# Patient Record
Sex: Male | Born: 1970 | Race: Black or African American | Hispanic: Yes | Marital: Single | Smoking: Former smoker
Health system: Southern US, Community
[De-identification: ages and names within clinical notes are randomized; demographics above are authoritative.]

## PROBLEM LIST (undated history)

## (undated) DIAGNOSIS — IMO0002 Reserved for concepts with insufficient information to code with codable children: Secondary | ICD-10-CM

## (undated) DIAGNOSIS — F17200 Nicotine dependence, unspecified, uncomplicated: Secondary | ICD-10-CM

## (undated) DIAGNOSIS — I10 Essential (primary) hypertension: Secondary | ICD-10-CM

## (undated) DIAGNOSIS — I2510 Atherosclerotic heart disease of native coronary artery without angina pectoris: Secondary | ICD-10-CM

## (undated) DIAGNOSIS — I2699 Other pulmonary embolism without acute cor pulmonale: Secondary | ICD-10-CM

## (undated) DIAGNOSIS — E1165 Type 2 diabetes mellitus with hyperglycemia: Secondary | ICD-10-CM

## (undated) DIAGNOSIS — I82409 Acute embolism and thrombosis of unspecified deep veins of unspecified lower extremity: Secondary | ICD-10-CM

## (undated) DIAGNOSIS — E785 Hyperlipidemia, unspecified: Secondary | ICD-10-CM

---

## 2019-11-30 DIAGNOSIS — I10 Essential (primary) hypertension: Secondary | ICD-10-CM | POA: Diagnosis not present

## 2019-11-30 DIAGNOSIS — E785 Hyperlipidemia, unspecified: Secondary | ICD-10-CM | POA: Diagnosis not present

## 2019-11-30 DIAGNOSIS — E119 Type 2 diabetes mellitus without complications: Secondary | ICD-10-CM | POA: Diagnosis not present

## 2019-11-30 DIAGNOSIS — Z7901 Long term (current) use of anticoagulants: Secondary | ICD-10-CM | POA: Diagnosis not present

## 2019-11-30 DIAGNOSIS — D688 Other specified coagulation defects: Secondary | ICD-10-CM | POA: Diagnosis not present

## 2020-01-07 ENCOUNTER — Other Ambulatory Visit: Payer: Self-pay

## 2020-01-07 ENCOUNTER — Emergency Department (HOSPITAL_COMMUNITY)
Admission: EM | Admit: 2020-01-07 | Discharge: 2020-01-07 | Disposition: A | Discharge status: Home / Self Care | Payer: BC Managed Care – PPO | Attending: Emergency Medicine | Admitting: Emergency Medicine

## 2020-01-07 ENCOUNTER — Encounter (HOSPITAL_COMMUNITY): Payer: Self-pay

## 2020-01-07 DIAGNOSIS — R202 Paresthesia of skin: Secondary | ICD-10-CM | POA: Diagnosis not present

## 2020-01-07 NOTE — ED Triage Notes (Signed)
Pt sts tingling in left inner forearm beginning at 1100 today. NIH-0

## 2020-01-07 NOTE — Discharge Instructions (Signed)
You were seen in the ER for tingling in left arm and hand  I think this is related to an inflammation of nerve in elbow/hand  Recommend wearing brace in wrist to keep it in neutral position at least at night time  Follow up with primary care doctor in 1 week if symptoms are not improving  Return for associated chest pain, shortness of breath, neck pain, weakness or paralysis of one side of face or body

## 2020-01-07 NOTE — ED Provider Notes (Signed)
[name]-EMERGENCY DEPT Provider Note   CSN: [id number] Arrival date & time: 01/07/20  1958     History Chief Complaint  Patient presents with  . Tingling    Greg Quinn is a 49 y.o. male with history of DM on metformin, HTN, DVT on warfarin presents to ER for evaluation of tingling in left arm from elbow to left 4th and 5th fingers that began this morning when he woke up.  This has been constant. States in the past he has occasionally he has noticed intermittent tingling in same distribution but has never lasted all day. 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 Denies any pain associated with this. Reports several years ago he had a significant injury where a wall fell on top of him. States he had a few "discs" in his neck and low back injured.  Denies associated neck pain, chest pain, shortness of breath. Denies weakness or numbness.   HPI     History reviewed. No pertinent past medical history.  There are no problems to display for this patient.   History reviewed. No pertinent surgical history.     No family history on file.  Social History   Tobacco Use  . Smoking status: Never Smoker  . Smokeless tobacco: Never Used  Substance Use Topics  . Alcohol use: Not Currently  . Drug use: Never    Home Medications Prior to Admission medications   Not on File    Allergies    Heparin  Review of Systems   Review of Systems  Neurological:       Tingling     Physical Exam Updated Vital Signs BP (!) 128/92 (BP Location: Right Arm)   Pulse 95   Temp 98.2 F (36.8 C) (Oral)   Resp 16   Ht 5\' 10"  (1.778 m)   Wt 122.5 kg   SpO2 96%   BMI 38.74 kg/m   Physical Exam Constitutional:      Appearance: He is well-developed.  HENT:     Head: Normocephalic.     Nose: Nose normal.  Eyes:     General: Lids are normal.  Neck:     Comments: No midline or paraspinal cervical tenderness  Cardiovascular:      Rate and Rhythm: Normal rate.     Comments: 1+ radial pulse LUE  Pulmonary:     Effort: Pulmonary effort is normal. No respiratory distress.  Musculoskeletal:        General: Normal range of motion.     Cervical back: Normal range of motion.     Comments: No reproducible CT spine, left shoulder, elbow or wrist tenderness. Full ROM of neck, shoulder, elbow, wrist without pain. Strength intact in left fingers, wrist and elbow against resistance   Neurological:     Mental Status: He is alert.     Comments: Sensation to light touch intact in radial, medial, ulnar nerve distribution. Strength 5/5 in LUE   Psychiatric:        Behavior: Behavior normal.     ED Results / Procedures / Treatments   Labs (all labs ordered are listed, but only abnormal results are displayed) Labs Reviewed - No data to display  EKG None  Radiology No results found.  Procedures Procedures (including critical care time)  Medications Ordered in ED Medications - No data to display  ED Course  I have reviewed the triage vital signs and the nursing notes.  Pertinent  labs & imaging results that were available during my care of the patient were reviewed by me and considered in my medical decision making (see chart for details).    MDM Rules/Calculators/A&P                          49 yo M presents with tingling in ulnar nerve distribution. No weakness or numbness on exam. Neurovascularly intact. No associated reproducible pain in neck or LUE. No indication for emergent lab work or imaging today. No associated CP, SOB.  He is a Location manager and frequently uses his hands/wrists.  Offered wrist brace but declined states he has one at home. Recommending rest, brace, PCP follow up if symptoms not improving. Return precautions given.  Final Clinical Impression(s) / ED Diagnoses Final diagnoses:  Tingling of upper extremity    Rx / DC Orders ED Discharge Orders    None       Liberty Handy,  PA-C 01/07/20 2253    Pricilla Loveless, MD 01/08/20 0030

## 2020-01-17 DIAGNOSIS — I10 Essential (primary) hypertension: Secondary | ICD-10-CM | POA: Diagnosis not present

## 2020-01-17 DIAGNOSIS — Z7984 Long term (current) use of oral hypoglycemic drugs: Secondary | ICD-10-CM | POA: Diagnosis not present

## 2020-01-17 DIAGNOSIS — Z760 Encounter for issue of repeat prescription: Secondary | ICD-10-CM | POA: Diagnosis not present

## 2020-01-17 DIAGNOSIS — E1165 Type 2 diabetes mellitus with hyperglycemia: Secondary | ICD-10-CM | POA: Diagnosis not present

## 2020-01-17 DIAGNOSIS — Z888 Allergy status to other drugs, medicaments and biological substances status: Secondary | ICD-10-CM | POA: Diagnosis not present

## 2020-01-17 DIAGNOSIS — Z20822 Contact with and (suspected) exposure to covid-19: Secondary | ICD-10-CM | POA: Diagnosis not present

## 2020-01-17 DIAGNOSIS — Z7901 Long term (current) use of anticoagulants: Secondary | ICD-10-CM | POA: Diagnosis not present

## 2020-01-17 DIAGNOSIS — R739 Hyperglycemia, unspecified: Secondary | ICD-10-CM | POA: Diagnosis not present

## 2020-01-29 DIAGNOSIS — Z03818 Encounter for observation for suspected exposure to other biological agents ruled out: Secondary | ICD-10-CM | POA: Diagnosis not present

## 2020-02-07 DIAGNOSIS — E119 Type 2 diabetes mellitus without complications: Secondary | ICD-10-CM | POA: Diagnosis not present

## 2020-02-07 DIAGNOSIS — I10 Essential (primary) hypertension: Secondary | ICD-10-CM | POA: Diagnosis not present

## 2020-02-07 DIAGNOSIS — Z86718 Personal history of other venous thrombosis and embolism: Secondary | ICD-10-CM | POA: Diagnosis not present

## 2020-08-01 DIAGNOSIS — I10 Essential (primary) hypertension: Secondary | ICD-10-CM | POA: Diagnosis not present

## 2020-08-01 DIAGNOSIS — E119 Type 2 diabetes mellitus without complications: Secondary | ICD-10-CM | POA: Diagnosis not present

## 2020-08-01 DIAGNOSIS — Z86718 Personal history of other venous thrombosis and embolism: Secondary | ICD-10-CM | POA: Diagnosis not present

## 2020-08-01 DIAGNOSIS — E785 Hyperlipidemia, unspecified: Secondary | ICD-10-CM | POA: Diagnosis not present

## 2020-08-20 ENCOUNTER — Emergency Department (HOSPITAL_COMMUNITY): Payer: BC Managed Care – PPO

## 2020-08-20 ENCOUNTER — Encounter (HOSPITAL_COMMUNITY): Payer: Self-pay

## 2020-08-20 ENCOUNTER — Encounter: Payer: Self-pay | Admitting: Physician Assistant

## 2020-08-20 ENCOUNTER — Inpatient Hospital Stay (HOSPITAL_COMMUNITY)
Admission: EM | Disposition: A | Discharge status: Home / Self Care | Payer: Self-pay | Source: Home / Self Care | Attending: Cardiovascular Disease

## 2020-08-20 ENCOUNTER — Other Ambulatory Visit: Payer: Self-pay

## 2020-08-20 ENCOUNTER — Inpatient Hospital Stay (HOSPITAL_COMMUNITY)
Admission: EM | Admit: 2020-08-20 | Discharge: 2020-08-21 | DRG: 247 | Disposition: A | Discharge status: Home / Self Care | Payer: BC Managed Care – PPO | Attending: Cardiovascular Disease | Admitting: Cardiovascular Disease

## 2020-08-20 ENCOUNTER — Inpatient Hospital Stay (HOSPITAL_COMMUNITY): Payer: BC Managed Care – PPO

## 2020-08-20 DIAGNOSIS — E118 Type 2 diabetes mellitus with unspecified complications: Secondary | ICD-10-CM | POA: Diagnosis present

## 2020-08-20 DIAGNOSIS — Z888 Allergy status to other drugs, medicaments and biological substances status: Secondary | ICD-10-CM

## 2020-08-20 DIAGNOSIS — I2699 Other pulmonary embolism without acute cor pulmonale: Secondary | ICD-10-CM | POA: Diagnosis present

## 2020-08-20 DIAGNOSIS — I82409 Acute embolism and thrombosis of unspecified deep veins of unspecified lower extremity: Secondary | ICD-10-CM | POA: Diagnosis present

## 2020-08-20 DIAGNOSIS — R079 Chest pain, unspecified: Secondary | ICD-10-CM | POA: Diagnosis not present

## 2020-08-20 DIAGNOSIS — Z86711 Personal history of pulmonary embolism: Secondary | ICD-10-CM | POA: Diagnosis not present

## 2020-08-20 DIAGNOSIS — F17210 Nicotine dependence, cigarettes, uncomplicated: Secondary | ICD-10-CM | POA: Diagnosis not present

## 2020-08-20 DIAGNOSIS — Z86718 Personal history of other venous thrombosis and embolism: Secondary | ICD-10-CM

## 2020-08-20 DIAGNOSIS — E1165 Type 2 diabetes mellitus with hyperglycemia: Secondary | ICD-10-CM | POA: Diagnosis not present

## 2020-08-20 DIAGNOSIS — E785 Hyperlipidemia, unspecified: Secondary | ICD-10-CM | POA: Diagnosis present

## 2020-08-20 DIAGNOSIS — I10 Essential (primary) hypertension: Secondary | ICD-10-CM | POA: Diagnosis present

## 2020-08-20 DIAGNOSIS — IMO0002 Reserved for concepts with insufficient information to code with codable children: Secondary | ICD-10-CM | POA: Insufficient documentation

## 2020-08-20 DIAGNOSIS — I200 Unstable angina: Secondary | ICD-10-CM | POA: Diagnosis present

## 2020-08-20 DIAGNOSIS — D7582 Heparin induced thrombocytopenia (HIT): Secondary | ICD-10-CM

## 2020-08-20 DIAGNOSIS — R059 Cough, unspecified: Secondary | ICD-10-CM | POA: Diagnosis not present

## 2020-08-20 DIAGNOSIS — I25110 Atherosclerotic heart disease of native coronary artery with unstable angina pectoris: Secondary | ICD-10-CM | POA: Diagnosis not present

## 2020-08-20 DIAGNOSIS — F17200 Nicotine dependence, unspecified, uncomplicated: Secondary | ICD-10-CM | POA: Diagnosis present

## 2020-08-20 DIAGNOSIS — Z95828 Presence of other vascular implants and grafts: Secondary | ICD-10-CM

## 2020-08-20 DIAGNOSIS — I2510 Atherosclerotic heart disease of native coronary artery without angina pectoris: Secondary | ICD-10-CM

## 2020-08-20 DIAGNOSIS — I214 Non-ST elevation (NSTEMI) myocardial infarction: Principal | ICD-10-CM | POA: Diagnosis present

## 2020-08-20 DIAGNOSIS — Z833 Family history of diabetes mellitus: Secondary | ICD-10-CM | POA: Diagnosis not present

## 2020-08-20 DIAGNOSIS — Z20822 Contact with and (suspected) exposure to covid-19: Secondary | ICD-10-CM | POA: Diagnosis not present

## 2020-08-20 DIAGNOSIS — Z955 Presence of coronary angioplasty implant and graft: Secondary | ICD-10-CM

## 2020-08-20 DIAGNOSIS — K219 Gastro-esophageal reflux disease without esophagitis: Secondary | ICD-10-CM | POA: Diagnosis present

## 2020-08-20 HISTORY — DX: Reserved for concepts with insufficient information to code with codable children: IMO0002

## 2020-08-20 HISTORY — DX: Nicotine dependence, unspecified, uncomplicated: F17.200

## 2020-08-20 HISTORY — DX: Other pulmonary embolism without acute cor pulmonale: I26.99

## 2020-08-20 HISTORY — DX: Hyperlipidemia, unspecified: E78.5

## 2020-08-20 HISTORY — PX: LEFT HEART CATH AND CORONARY ANGIOGRAPHY: CATH118249

## 2020-08-20 HISTORY — DX: Essential (primary) hypertension: I10

## 2020-08-20 HISTORY — PX: CORONARY STENT INTERVENTION: CATH118234

## 2020-08-20 HISTORY — DX: Non-ST elevation (NSTEMI) myocardial infarction: I21.4

## 2020-08-20 HISTORY — DX: Type 2 diabetes mellitus with hyperglycemia: E11.65

## 2020-08-20 HISTORY — DX: Acute embolism and thrombosis of unspecified deep veins of unspecified lower extremity: I82.409

## 2020-08-20 HISTORY — PX: INTRAVASCULAR ULTRASOUND/IVUS: CATH118244

## 2020-08-20 LAB — COMPREHENSIVE METABOLIC PANEL
ALT: 30 U/L (ref 0–44)
AST: 31 U/L (ref 15–41)
Albumin: 4.1 g/dL (ref 3.5–5.0)
Alkaline Phosphatase: 96 U/L (ref 38–126)
Anion gap: 12 (ref 5–15)
BUN: 13 mg/dL (ref 6–20)
CO2: 25 mmol/L (ref 22–32)
Calcium: 9.4 mg/dL (ref 8.9–10.3)
Chloride: 100 mmol/L (ref 98–111)
Creatinine, Ser: 0.85 mg/dL (ref 0.61–1.24)
GFR, Estimated: >60 mL/min (ref >60)
Glucose, Bld: 253 mg/dL — ABNORMAL HIGH (ref 70–99)
Potassium: 3.7 mmol/L (ref 3.5–5.1)
Sodium: 137 mmol/L (ref 135–145)
Total Bilirubin: 0.5 mg/dL (ref 0.3–1.2)
Total Protein: 7.3 g/dL (ref 6.5–8.1)

## 2020-08-20 LAB — ECHOCARDIOGRAM COMPLETE
AR max vel: 1.93 cm2
AV Area VTI: 2.11 cm2
AV Area mean vel: 1.61 cm2
AV Mean grad: 6 mmHg
AV Peak grad: 10 mmHg
Ao pk vel: 1.58 m/s
Area-P 1/2: 3.65 cm2
Height: 70.5 in
MV VTI: 1.99 cm2
S' Lateral: 2.2 cm
Weight: 4192 oz

## 2020-08-20 LAB — CBC WITH DIFFERENTIAL/PLATELET
Abs Immature Granulocytes: 0.04 10*3/uL (ref 0.00–0.07)
Basophils Absolute: 0 10*3/uL (ref 0.0–0.1)
Basophils Relative: 0 %
Eosinophils Absolute: 0.1 10*3/uL (ref 0.0–0.5)
Eosinophils Relative: 1 %
HCT: 40.6 % (ref 39.0–52.0)
Hemoglobin: 13.5 g/dL (ref 13.0–17.0)
Immature Granulocytes: 0 %
Lymphocytes Relative: 11 %
Lymphs Abs: 1.2 10*3/uL (ref 0.7–4.0)
MCH: 30.3 pg (ref 26.0–34.0)
MCHC: 33.3 g/dL (ref 30.0–36.0)
MCV: 91 fL (ref 80.0–100.0)
Monocytes Absolute: 0.6 10*3/uL (ref 0.1–1.0)
Monocytes Relative: 6 %
Neutro Abs: 9 10*3/uL — ABNORMAL HIGH (ref 1.7–7.7)
Neutrophils Relative %: 82 %
Platelets: 240 10*3/uL (ref 150–400)
RBC: 4.46 MIL/uL (ref 4.22–5.81)
RDW: 13.3 % (ref 11.5–15.5)
WBC: 10.9 10*3/uL — ABNORMAL HIGH (ref 4.0–10.5)
nRBC: 0 % (ref 0.0–0.2)

## 2020-08-20 LAB — APTT
aPTT: 28 seconds (ref 24–36)
aPTT: 92 seconds — ABNORMAL HIGH (ref 24–36)

## 2020-08-20 LAB — TROPONIN I (HIGH SENSITIVITY)
Troponin I (High Sensitivity): 432 ng/L (ref <18)
Troponin I (High Sensitivity): 1427 ng/L (ref <18)
Troponin I (High Sensitivity): 10331 ng/L (ref <18)
Troponin I (High Sensitivity): 8707 ng/L (ref <18)

## 2020-08-20 LAB — GLUCOSE, CAPILLARY
Glucose-Capillary: 103 mg/dL — ABNORMAL HIGH (ref 70–99)
Glucose-Capillary: 138 mg/dL — ABNORMAL HIGH (ref 70–99)
Glucose-Capillary: 212 mg/dL — ABNORMAL HIGH (ref 70–99)
Glucose-Capillary: 208 mg/dL — ABNORMAL HIGH (ref 70–99)

## 2020-08-20 LAB — HIV ANTIBODY (ROUTINE TESTING W REFLEX): HIV Screen 4th Generation wRfx: NONREACTIVE

## 2020-08-20 LAB — LIPASE, BLOOD: Lipase: 32 U/L (ref 11–51)

## 2020-08-20 LAB — POCT ACTIVATED CLOTTING TIME
Activated Clotting Time: 242 seconds
Activated Clotting Time: 347 seconds

## 2020-08-20 LAB — RESP PANEL BY RT-PCR (FLU A&B, COVID) ARPGX2
Influenza A by PCR: NEGATIVE
Influenza B by PCR: NEGATIVE
SARS Coronavirus 2 by RT PCR: NEGATIVE

## 2020-08-20 LAB — PROTIME-INR
INR: 1 (ref 0.8–1.2)
Prothrombin Time: 12.7 seconds (ref 11.4–15.2)

## 2020-08-20 SURGERY — LEFT HEART CATH AND CORONARY ANGIOGRAPHY
Anesthesia: LOCAL

## 2020-08-20 MED ORDER — ACETAMINOPHEN 500 MG PO TABS
1000.0000 mg | ORAL_TABLET | Freq: Once | ORAL | Status: AC
  Administered 2020-08-20: 1000 mg via ORAL
  Filled 2020-08-20: qty 2

## 2020-08-20 MED ORDER — NITROGLYCERIN 1 MG/10 ML FOR IR/CATH LAB
INTRA_ARTERIAL | Status: AC
  Filled 2020-08-20: qty 10

## 2020-08-20 MED ORDER — SODIUM CHLORIDE 0.9 % IV SOLN
INTRAVENOUS | Status: AC | PRN
  Administered 2020-08-20: 1000 mL via INTRAVENOUS

## 2020-08-20 MED ORDER — NITROGLYCERIN IN D5W 200-5 MCG/ML-% IV SOLN
0.0000 ug/min | INTRAVENOUS | Status: DC
  Administered 2020-08-20: 5 ug/min via INTRAVENOUS
  Filled 2020-08-20: qty 250

## 2020-08-20 MED ORDER — ATORVASTATIN CALCIUM 80 MG PO TABS: 80.0000 mg | ORAL_TABLET | Freq: Every day | ORAL | Status: DC

## 2020-08-20 MED ORDER — SODIUM CHLORIDE 0.9 % IV SOLN: INTRAVENOUS | Status: DC | PRN

## 2020-08-20 MED ORDER — ATORVASTATIN CALCIUM 80 MG PO TABS
80.0000 mg | ORAL_TABLET | Freq: Every day | ORAL | Status: DC
  Administered 2020-08-20 – 2020-08-21 (×2): 80 mg via ORAL
  Filled 2020-08-20 (×2): qty 1

## 2020-08-20 MED ORDER — ACETAMINOPHEN 325 MG PO TABS: 650.0000 mg | ORAL_TABLET | ORAL | Status: DC | PRN

## 2020-08-20 MED ORDER — ONDANSETRON HCL 4 MG/2ML IJ SOLN: 4.0000 mg | Freq: Four times a day (QID) | INTRAMUSCULAR | Status: DC | PRN

## 2020-08-20 MED ORDER — SODIUM CHLORIDE 0.9 % WEIGHT BASED INFUSION: 3.0000 mL/kg/h | INTRAVENOUS | Status: DC

## 2020-08-20 MED ORDER — AMLODIPINE BESYLATE 5 MG PO TABS
5.0000 mg | ORAL_TABLET | Freq: Every day | ORAL | Status: DC
  Administered 2020-08-20 – 2020-08-21 (×2): 5 mg via ORAL
  Filled 2020-08-20 (×2): qty 1

## 2020-08-20 MED ORDER — LISINOPRIL 20 MG PO TABS
20.0000 mg | ORAL_TABLET | Freq: Every day | ORAL | Status: DC
  Administered 2020-08-20 – 2020-08-21 (×2): 20 mg via ORAL
  Filled 2020-08-20 (×2): qty 1

## 2020-08-20 MED ORDER — LIDOCAINE HCL (PF) 1 % IJ SOLN
INTRAMUSCULAR | Status: DC | PRN
  Administered 2020-08-20: 2 mL via SUBCUTANEOUS

## 2020-08-20 MED ORDER — VERAPAMIL HCL 2.5 MG/ML IV SOLN
INTRAVENOUS | Status: DC | PRN
  Administered 2020-08-20: 10 mL via INTRA_ARTERIAL

## 2020-08-20 MED ORDER — ASPIRIN EC 81 MG PO TBEC
81.0000 mg | DELAYED_RELEASE_TABLET | Freq: Every day | ORAL | Status: DC
  Administered 2020-08-21: 81 mg via ORAL
  Filled 2020-08-20: qty 1

## 2020-08-20 MED ORDER — LABETALOL HCL 5 MG/ML IV SOLN: 10.0000 mg | INTRAVENOUS | Status: AC | PRN

## 2020-08-20 MED ORDER — LIDOCAINE HCL (PF) 1 % IJ SOLN
INTRAMUSCULAR | Status: AC
  Filled 2020-08-20: qty 30

## 2020-08-20 MED ORDER — MIDAZOLAM HCL 2 MG/2ML IJ SOLN
INTRAMUSCULAR | Status: AC
  Filled 2020-08-20: qty 2

## 2020-08-20 MED ORDER — ALUM & MAG HYDROXIDE-SIMETH 200-200-20 MG/5ML PO SUSP
30.0000 mL | Freq: Once | ORAL | Status: AC
  Administered 2020-08-20: 30 mL via ORAL
  Filled 2020-08-20: qty 30

## 2020-08-20 MED ORDER — HEPARIN (PORCINE) IN NACL 1000-0.9 UT/500ML-% IV SOLN
INTRAVENOUS | Status: AC
  Filled 2020-08-20: qty 1000

## 2020-08-20 MED ORDER — INSULIN ASPART 100 UNIT/ML IJ SOLN
0.0000 [IU] | Freq: Three times a day (TID) | INTRAMUSCULAR | Status: DC
  Administered 2020-08-21 (×2): 3 [IU] via SUBCUTANEOUS

## 2020-08-20 MED ORDER — SODIUM CHLORIDE 0.9% FLUSH
3.0000 mL | Freq: Two times a day (BID) | INTRAVENOUS | Status: DC
  Administered 2020-08-20 (×2): 3 mL via INTRAVENOUS

## 2020-08-20 MED ORDER — MIDAZOLAM HCL 2 MG/2ML IJ SOLN
INTRAMUSCULAR | Status: DC | PRN
  Administered 2020-08-20: 2 mg via INTRAVENOUS

## 2020-08-20 MED ORDER — SODIUM CHLORIDE 0.9 % IV SOLN: 250.0000 mL | INTRAVENOUS | Status: DC | PRN

## 2020-08-20 MED ORDER — SODIUM CHLORIDE 0.9% FLUSH: 3.0000 mL | INTRAVENOUS | Status: DC | PRN

## 2020-08-20 MED ORDER — IOHEXOL 350 MG/ML SOLN
INTRAVENOUS | Status: DC | PRN
  Administered 2020-08-20: 115 mL

## 2020-08-20 MED ORDER — FENTANYL CITRATE (PF) 100 MCG/2ML IJ SOLN
INTRAMUSCULAR | Status: AC
  Filled 2020-08-20: qty 2

## 2020-08-20 MED ORDER — PNEUMOCOCCAL VAC POLYVALENT 25 MCG/0.5ML IJ INJ
0.5000 mL | INJECTION | INTRAMUSCULAR | Status: DC
  Filled 2020-08-20: qty 0.5

## 2020-08-20 MED ORDER — SODIUM CHLORIDE 0.9 % IV SOLN
1.7500 mg/kg/h | INTRAVENOUS | Status: DC
  Administered 2020-08-20 (×3): 1.75 mg/kg/h via INTRAVENOUS
  Filled 2020-08-20 (×7): qty 250

## 2020-08-20 MED ORDER — ASPIRIN 81 MG PO CHEW
324.0000 mg | CHEWABLE_TABLET | Freq: Once | ORAL | Status: AC
  Administered 2020-08-20: 324 mg via ORAL
  Filled 2020-08-20: qty 4

## 2020-08-20 MED ORDER — BIVALIRUDIN BOLUS VIA INFUSION
0.1000 mg/kg | Freq: Once | INTRAVENOUS | Status: AC
  Administered 2020-08-20 (×2): 11.9 mg via INTRAVENOUS
  Filled 2020-08-20: qty 12

## 2020-08-20 MED ORDER — NITROGLYCERIN 1 MG/10 ML FOR IR/CATH LAB
INTRA_ARTERIAL | Status: DC | PRN
  Administered 2020-08-20: 200 ug via INTRACORONARY

## 2020-08-20 MED ORDER — FENTANYL CITRATE (PF) 100 MCG/2ML IJ SOLN
INTRAMUSCULAR | Status: DC | PRN
  Administered 2020-08-20: 25 ug via INTRAVENOUS

## 2020-08-20 MED ORDER — TICAGRELOR 90 MG PO TABS
ORAL_TABLET | ORAL | Status: AC
  Filled 2020-08-20: qty 2

## 2020-08-20 MED ORDER — TICAGRELOR 90 MG PO TABS
ORAL_TABLET | ORAL | Status: DC | PRN
  Administered 2020-08-20: 180 mg via ORAL

## 2020-08-20 MED ORDER — VERAPAMIL HCL 2.5 MG/ML IV SOLN
INTRAVENOUS | Status: AC
  Filled 2020-08-20: qty 2

## 2020-08-20 MED ORDER — MORPHINE SULFATE (PF) 2 MG/ML IV SOLN
2.0000 mg | INTRAVENOUS | Status: DC
  Administered 2020-08-20 (×2): 2 mg via INTRAVENOUS
  Filled 2020-08-20 (×2): qty 1

## 2020-08-20 MED ORDER — CLOPIDOGREL BISULFATE 75 MG PO TABS
300.0000 mg | ORAL_TABLET | Freq: Once | ORAL | Status: AC
  Administered 2020-08-21: 300 mg via ORAL
  Filled 2020-08-20: qty 4

## 2020-08-20 MED ORDER — INSULIN ASPART 100 UNIT/ML IJ SOLN
0.0000 [IU] | Freq: Every day | INTRAMUSCULAR | Status: DC
  Administered 2020-08-20: 2 [IU] via SUBCUTANEOUS

## 2020-08-20 MED ORDER — NITROGLYCERIN 0.4 MG SL SUBL
0.4000 mg | SUBLINGUAL_TABLET | SUBLINGUAL | Status: AC | PRN
Start: 2020-08-20 — End: 2020-08-20
  Administered 2020-08-20 (×3): 0.4 mg via SUBLINGUAL
  Filled 2020-08-20: qty 1

## 2020-08-20 MED ORDER — ASPIRIN 81 MG PO CHEW: 81.0000 mg | CHEWABLE_TABLET | Freq: Every day | ORAL | Status: DC

## 2020-08-20 MED ORDER — HYDRALAZINE HCL 20 MG/ML IJ SOLN: 10.0000 mg | INTRAMUSCULAR | Status: AC | PRN

## 2020-08-20 MED ORDER — SODIUM CHLORIDE 0.9 % WEIGHT BASED INFUSION
1.0000 mL/kg/h | INTRAVENOUS | Status: DC
  Administered 2020-08-20: 1 mL/kg/h via INTRAVENOUS

## 2020-08-20 MED ORDER — CLOPIDOGREL BISULFATE 75 MG PO TABS: 75.0000 mg | ORAL_TABLET | Freq: Every day | ORAL | Status: DC

## 2020-08-20 MED ORDER — NITROGLYCERIN IN D5W 200-5 MCG/ML-% IV SOLN: 0.0000 ug/min | INTRAVENOUS | Status: DC

## 2020-08-20 MED ORDER — METOPROLOL TARTRATE 12.5 MG HALF TABLET
12.5000 mg | ORAL_TABLET | Freq: Two times a day (BID) | ORAL | Status: DC
  Administered 2020-08-20 (×2): 12.5 mg via ORAL
  Filled 2020-08-20 (×2): qty 1

## 2020-08-20 SURGICAL SUPPLY — 20 items
BALLN SAPPHIRE 3.0X20 (BALLOONS) ×2
BALLN ~~LOC~~ EMERGE MR 4.5X12 (BALLOONS) ×2
BALLOON SAPPHIRE 3.0X20 (BALLOONS) ×1 IMPLANT
BALLOON ~~LOC~~ EMERGE MR 4.5X12 (BALLOONS) ×1 IMPLANT
CATH 5FR JL3.5 JR4 ANG PIG MP (CATHETERS) ×2 IMPLANT
CATH LAUNCHER 6FR EBU3.5 (CATHETERS) ×2 IMPLANT
CATH OPTICROSS HD (CATHETERS) ×2 IMPLANT
DEVICE RAD COMP TR BAND LRG (VASCULAR PRODUCTS) ×2 IMPLANT
GLIDESHEATH SLEND SS 6F .021 (SHEATH) ×2 IMPLANT
GUIDEWIRE INQWIRE 1.5J.035X260 (WIRE) ×1 IMPLANT
INQWIRE 1.5J .035X260CM (WIRE) ×2
KIT ENCORE 26 ADVANTAGE (KITS) ×2 IMPLANT
KIT HEART LEFT (KITS) ×2 IMPLANT
KIT HEMO VALVE WATCHDOG (MISCELLANEOUS) ×2 IMPLANT
PACK CARDIAC CATHETERIZATION (CUSTOM PROCEDURE TRAY) ×2 IMPLANT
SLED PULL BACK IVUS (MISCELLANEOUS) ×2 IMPLANT
STENT ONYX FRONTIER 4.0X26 (Permanent Stent) ×2 IMPLANT
TRANSDUCER W/STOPCOCK (MISCELLANEOUS) ×2 IMPLANT
TUBING CIL FLEX 10 FLL-RA (TUBING) ×2 IMPLANT
WIRE ASAHI PROWATER 180CM (WIRE) ×2 IMPLANT

## 2020-08-20 NOTE — Progress Notes (Signed)
Patient with multiple cardiac RFs (HTN, DM2, active smoker, HLD) presented with chest pain of 48h duration. Initial ECG with upsloping ST most prominent in inferior leads, do not meet STEMI criteria and no prior for comparison. Repeat ECG with less pronounced ST sloping. hsT increased however CP very mild per ED physician. Plan for ED->ED transfer to expedite eval given limited inpatient bed availability currently. Has allergy listed to heparin, high severity but not listed type. Prior DVT on warfarin ? (INR 1.0). S/p ASA 324 mg PO (0205), bival gtt (0431), low dose NG gtt.

## 2020-08-20 NOTE — Progress Notes (Signed)
ANTICOAGULATION CONSULT NOTE - Initial Consult  Pharmacy Consult for bivalirudin Indication: chest pain/ACS  Allergies  Allergen Reactions   Heparin Other (See Comments)    Patient Measurements: Height: 5' 10.5" (179.1 cm) Weight: 118.8 kg (262 lb) IBW/kg (Calculated) : 74.15   Vital Signs: Temp: 98.6 F (37 C) (07/27 0117) Temp Source: Oral (07/27 0117) BP: 154/99 (07/27 0345) Pulse Rate: 89 (07/27 0345)  Labs: Recent Labs    08/20/20 0209  HGB 13.5  HCT 40.6  PLT 240  CREATININE 0.85  TROPONINIHS 432*    Estimated Creatinine Clearance: 133.8 mL/min (by C-G formula based on SCr of 0.85 mg/dL).   Medical History: History reviewed. No pertinent past medical history.   Assessment: 50 yo male with chief complaints of chest pain.  Has hx of hypertension hyperlipidemia diabetes and is an active smoker.  Pharmacy consulted to dose bivalirudin for ACS/STEMI (pt allergic to heparin)  Goal of Therapy:  aPTT 50-85 seconds Monitor platelets by anticoagulation protocol: Yes   Plan:  -bivalirudin 0.1mg /kg bolus x1 then begin infusion at 1.75mg /kg/hr (adjBW) aPTT in 4 hours  Arley Phenix RPh 08/20/2020, 4:08 AM

## 2020-08-20 NOTE — ED Notes (Addendum)
Pt bib carelink for cardiology consult. Pt presented to [facility] with 9/10 chest pain not relieved by SL nitroglycerin. Initial troponin 432, repeat trop 1427. Pt arrives on nitroglycerin infusion at 81mcg/hr and angiomax infusion at 161 mg/hr. Allergy to heparin. Pt still c/o 9/10 chest pain.  BP: 158/91  P: 77  RR: 18  Spo2: 96% RA

## 2020-08-20 NOTE — H&P (Addendum)
Cardiology Admission History and Physical:   Patient ID: Greg Quinn MRN: [id number]; DOB: Feb 08, 1970   Admission date: 08/20/2020  PCP:  Patient, No Pcp Per (Inactive)   [facility] Providers Cardiologist:  Thurmon Fair, MD new   Chief Complaint:  NSTEMI  Patient Profile:   Greg Quinn is a 50 y.o. 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  History of Present Illness:   [name] is maintained on xarelto for hx of DVT. Was seen by PCP 08/01/20 and A1c not at [facility] at > 8.0. He had been out of DM medications for 40 days. HTN controlled on amlodipine and lisinopril. HLD managed with 40 mg lipitor, but LDL was 152.  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 He has an IVC filter in place. He was taking coumadin, but transitioned to xarelto and has done well.   He reports seeing a cardiologist after his PE/DVT, but has had no other cardiac problems. He had a stress test in 2017 in [place] that was "fine."  He presented to [facility] with complaints of CP. CP started on Sunday night when he was getting ready for bed. CP started whey he got into bed in the recumbent position. CP located in his left lower chest with radiation to his jaw. He sat up and eventually the CP subsided and he was able to sleep. Monday he did not have CP, but CP recurred Monday evening in the same fashion. He was again able to sleep. Tues evening, he developed CP with worsening intensity as a 9/10, with diaphoresis, radiation to his jaw, and nausea. CP did not subside and he presented to [facility].   He has a 25 pack year history. He is divorced and has 8 children, youngest is 67 yo. His girlfriend is at bedside. 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 No family history of heart disease, but DM in both parents.   Upon arrival, EKG with rounded and elevated T wave in inferior leads and Q waves anteriorly, improved on subsequent tracings. He was started on angiomax and nitro gtt. CP has reduced to a 7/10 and is worse when he is resting flat. CP moves lower in his chest when he sits up.     Past Medical History:  Diagnosis Date   Bilateral pulmonary embolism (HCC)    2009, on xarelto, IVC filter   Current every day smoker    25 pack year history   DVT, lower extremity (HCC)    2009   Hyperlipidemia LDL goal <70    Hypertension    NSTEMI (non-ST elevated myocardial infarction) (HCC) 08/20/2020   Uncontrolled diabetes mellitus (HCC)     History reviewed. No pertinent surgical history.   Medications Prior to Admission: Prior to Admission medications   Not on File     Allergies:    Allergies  Allergen Reactions   Heparin Other (See Comments)    Social History:   Social History   Socioeconomic History   Marital status: Single    Spouse name: Not on file   Number of children: Not on file   Years of education: Not on file  Highest education level: Not on file  Occupational History   Not on file  Tobacco Use   Smoking status: Every Day    Packs/day: 1.00    Types: Cigarettes   Smokeless tobacco: Never  Substance and Sexual Activity   Alcohol use: Not Currently   Drug use: Never   Sexual activity: Not on file  Other Topics Concern   Not on file  Social History Narrative   Not on file   Social Determinants of Health   Financial Resource Strain: Not on file  Food Insecurity: Not on file  Transportation Needs: Not on file  Physical Activity: Not on file  Stress: Not on file  Social Connections: Not on file  Intimate Partner Violence: Not on file    Family History:   The patient's family history includes Diabetes in his father.    ROS:   Please see the history of present illness.  All other ROS reviewed and negative.     Physical Exam/Data:   Vitals:   08/20/20 0645 08/20/20 0700 08/20/20 0730 08/20/20 0800  BP: 119/77 123/78 114/72 115/72  Pulse: 100 82 63 73  Resp: (!) 26 (!) 26 18 19   Temp:      TempSrc:      SpO2: 95% 96% 97% 97%  Weight:      Height:        Intake/Output Summary (Last 24 hours) at 08/20/2020 0835 Last data filed at 08/20/2020 0738 Gross per 24 hour  Intake 50 ml  Output 800 ml  Net -750 ml   Last 3 Weights 08/20/2020 01/07/2020  Weight (lbs) 262 lb 270 lb  Weight (kg) 118.842 kg 122.471 kg     Body mass index is 37.06 kg/m.  General:  obese male in NAD Neck: no JVD Endocrine:  No thryomegaly Vascular: No carotid bruits; FA pulses 2+ bilaterally without bruits  Cardiac:  normal S1, S2; RRR; no murmur  Lungs:  clear to auscultation bilaterally, no wheezing, rhonchi or rales  Abd: soft, nontender, no hepatomegaly  Ext: mild B LE edema Musculoskeletal:  No deformities, BUE and BLE strength normal and equal Skin: warm and dry  Neuro:  CNs 2-12 intact, no focal abnormalities noted Psych:  Normal affect    EKG:  The ECG that was done was personally reviewed and demonstrates sinus tachycardia with HR 101, rounded/elevated T waves in inferior leads with Q waves anterior leads  Relevant CV Studies:  none  Laboratory Data:  High Sensitivity Troponin:   Recent Labs  Lab 08/20/20 0209 08/20/20 0349  TROPONINIHS 432* 1,427*      Chemistry Recent Labs  Lab 08/20/20 0209  NA 137  K 3.7  CL 100  CO2 25  GLUCOSE 253*  BUN 13  CREATININE 0.85  CALCIUM 9.4  GFRNONAA >60  ANIONGAP 12    Recent Labs  Lab 08/20/20 0209  PROT 7.3  ALBUMIN 4.1  AST 31  ALT 30  ALKPHOS 96  BILITOT 0.5   Hematology Recent Labs  Lab 08/20/20 0209  WBC 10.9*  RBC 4.46  HGB 13.5  HCT 40.6  MCV 91.0  MCH 30.3  MCHC 33.3  RDW 13.3  PLT 240   BNPNo results for input(s): BNP, PROBNP  in the last 168 hours.  DDimer No results for input(s): DDIMER in the last 168 hours.   Radiology/Studies:  DG Chest Port 1 View  Result Date: 08/20/2020 CLINICAL DATA:  Cough with mid to left-sided chest pain x3 days. EXAM: PORTABLE  CHEST 1 VIEW COMPARISON:  None. FINDINGS: Decreased lung volumes are seen which is likely secondary to the degree of patient inspiration. Mild areas of atelectasis and/or infiltrate noted within the bilateral lung bases. There is no evidence of a pleural effusion or pneumothorax. The heart size and mediastinal contours are within normal limits. The visualized skeletal structures are unremarkable. IMPRESSION: Decreased lung volumes with mild bibasilar atelectasis and/or infiltrate. Electronically Signed   By: Aram Candelahaddeus  Houston M.D.   On: 08/20/2020 03:34     Assessment and Plan:   NSTEMI Unstable angina He describes pain concerning for unstable angina. HS troponin has increased from 432 --> 1427. EKG concerning for anterior infarct. Risk factors for ACS include HTN, uncontrolled HLD, obesity, and current everyday smoker. Given his symptoms, rising CE, and EKG, will plan for definitive angiography today. NPO. Last dose of xarelto was 6AM yesterday. Nitro gtt running at 10 mcg/min.   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 Will avoid heparin. Currently receiving angiomax.     Hypertension - maintained on lisinopril and amlodipine   Hyperlipidemia with LDL goal < 70 08/01/20: LDL 98, HDL 54 - will increase lipitor from 40 mg to 80 mg   Diabetes mellitus with hyperglycemia - A1c 9.4% - on metformin - will start SSI   Current smoker - discussed cessation    Risk Assessment/Risk Scores:   TIMI Risk Score for Unstable Angina or Non-ST Elevation MI:   The patient's TIMI risk score is 3, which indicates a 13% risk of all cause mortality, new or recurrent myocardial infarction or need for urgent  revascularization in the next 14 days.{   Severity of Illness: The appropriate patient status for this patient is INPATIENT. Inpatient status is judged to be reasonable and necessary in order to provide the required intensity of service to ensure the patient's safety. The patient's presenting symptoms, physical exam findings, and initial radiographic and laboratory data in the context of their chronic comorbidities is felt to place them at high risk for further clinical deterioration. Furthermore, it is not anticipated that the patient will be medically stable for discharge from the hospital within 2 midnights of admission. The following factors support the patient status of inpatient.   " The patient's presenting symptoms include [place]. " The worrisome physical exam findings include chest pain. " The initial radiographic and laboratory data are worrisome because of EKG changes. " The chronic co-morbidities include HTN, HLD, DM, obesity.   * I certify that at the point of admission it is my clinical judgment that the patient will require inpatient hospital care spanning beyond 2 midnights from the point of admission due to high intensity of service, high risk for further deterioration and high frequency of surveillance required.*   For questions or updates, please contact [facility] Please consult [email or web address] for contact info under     Signed, Marcelino Dusterngela Nicole Duke, PA  08/20/2020 8:35 AM   I have seen and examined the patient along with Marcelino DusterAngela Nicole Duke, PA .  I have reviewed the chart, notes and new data.  I agree with PA/NP's note.  Key new complaints: Symptoms are concerning for stuttering acute myocardial infarction over the last 48 hours or so.  He did not have any exertional chest pain during intense physical activity last week.  Risk factors include poorly controlled diabetes mellitus, dyslipidemia ("total cholesterol is good, but bad cholesterol is high and good cholesterol  was  low" although recent labs show HDL 54), currently not on statin, hypertension, ongoing smoking.  He has a history of extensive DVT and pulmonary embolism more than 10 years ago and still has an inferior vena cava filter in place, on chronic anticoagulation with Xarelto.  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 Key examination changes: Obese, but also muscular and fit appearing.  No acute distress.  S4 present.  No murmurs.  Blood pressure was severely elevated on presentation, but has now normalized (on intravenous nitroglycerin). Key new findings / data: Moderately elevated high-sensitivity troponin, rising.  Initial ECG shows atypical upwardly convex ST segment elevation in the inferior leads, subsiding on subsequent ECGs.  All tracings show poor anterior R wave progression, suggestive of an old anteroseptal infarction.  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  Note that ECG report from [facility] from 11/30/2019 reports "poor R wave progression" but that study cannot be directly reviewed. Labs from [place] from 08/01/2020 include hemoglobin A1c of 9.5%, HDL 54, LDL 98 cholesterol 167, triglycerides 80.  PLAN: Recommend early cardiac catheterization and percutaneous intervention/stent as indicated. This procedure has been fully reviewed with the patient and written informed consent has been obtained. He is on intravenous bivalirudin due to potential history of heparin-induced thrombocytopenia. Will need to resume atorvastatin at high dose 80 mg daily (he tolerated this medication well, stopped because he "ran out", not due to side effects). He needs to restart long-term oral anticoagulants after his procedure.  If he receives a stent, it may be best to treat with aspirin and clopidogrel so that he can resume Xarelto, rather than ticagrelor, not withstanding presentation with acute coronary syndrome.  After a month, stop  aspirin and continue clopidogrel plus direct oral anticoagulant.  Thurmon Fair, MD, [facility] [phone] 08/20/2020, 8:45 AM

## 2020-08-20 NOTE — Progress Notes (Signed)
Lab called with a Troponin level of 10,331. Paged PA Duke.

## 2020-08-20 NOTE — ED Notes (Signed)
[place] RN charge at [facility] [employer] aware that patient is coming to be consulted by cardiology

## 2020-08-20 NOTE — ED Triage Notes (Signed)
Pt reports having chest pain for the past 3 days. The pain is tight and burning. Pain 10/10.

## 2020-08-20 NOTE — Progress Notes (Signed)
  Echocardiogram 2D Echocardiogram has been performed.  Greg Quinn 08/20/2020, 5:25 PM

## 2020-08-20 NOTE — Interval H&P Note (Signed)
Cath Lab Visit (complete for each Cath Lab visit)  Clinical Evaluation Leading to the Procedure:   ACS: Yes.    Non-ACS:    Anginal Classification: CCS IV  Anti-ischemic medical therapy: Minimal Therapy (1 class of medications)  Non-Invasive Test Results: No non-invasive testing performed  Prior CABG: No previous CABG      History and Physical Interval Note:  08/20/2020 10:46 AM  Greg Quinn  has presented today for surgery, with the diagnosis of nstemi.  The various methods of treatment have been discussed with the patient and family. After consideration of risks, benefits and other options for treatment, the patient has consented to  Procedure(s): LEFT HEART CATH AND CORONARY ANGIOGRAPHY (N/A) as a surgical intervention.  The patient's history has been reviewed, patient examined, no change in status, stable for surgery.  I have reviewed the patient's chart and labs.  Questions were answered to the patient's satisfaction.     Lance Muss

## 2020-08-20 NOTE — ED Provider Notes (Addendum)
[facility]-EMERGENCY DEPT Provider Note   CSN: [id number] Arrival date & time: 08/20/20  0101     History Chief Complaint  Patient presents with   Chest Pain    Greg Quinn is a 50 y.o. male.  50 yo M with a chief complaints of chest pain.  This is been off and on for the past 48 hours or so.  Describes that it is pinpoint to an pressure.  Feels like when he has had reflux in the past.  Denies radiation of the pain denies shortness of breath.  He denies exertional symptoms.  He has had some nausea and diaphoresis with this.  Denies history of MI.  Has a history of hypertension hyperlipidemia diabetes and is an active smoker denies family history of MI.  Has a history of DVT.  The history is provided by the patient.  Chest Pain Pain location:  Epigastric Pain quality: aching   Pain radiates to:  Does not radiate Pain severity:  Severe Onset quality:  Gradual Duration:  2 days Timing:  Intermittent Progression:  Worsening Chronicity:  New Relieved by:  Nothing Worsened by:  Nothing Ineffective treatments:  None tried Associated symptoms: diaphoresis and nausea   Associated symptoms: no abdominal pain, no fever, no headache, no palpitations, no shortness of breath and no vomiting       History reviewed. No pertinent past medical history.  Patient Active Problem List   Diagnosis Date Noted   NSTEMI (non-ST elevated myocardial infarction) (HCC) 08/20/2020    History reviewed. No pertinent surgical history.     No family history on file.  Social History   Tobacco Use   Smoking status: Never   Smokeless tobacco: Never  Substance Use Topics   Alcohol use: Not Currently   Drug use: Never    Home Medications Prior to Admission medications   Not on File    Allergies    Heparin  Review of Systems   Review of Systems  Constitutional:  Positive for diaphoresis. Negative for chills and fever.  HENT:  Negative for congestion  and facial swelling.   Eyes:  Negative for discharge and visual disturbance.  Respiratory:  Negative for shortness of breath.   Cardiovascular:  Positive for chest pain. Negative for palpitations.  Gastrointestinal:  Positive for nausea. Negative for abdominal pain, diarrhea and vomiting.  Musculoskeletal:  Negative for arthralgias and myalgias.  Skin:  Negative for color change and rash.  Neurological:  Negative for tremors, syncope and headaches.  Psychiatric/Behavioral:  Negative for confusion and dysphoric mood.    Physical Exam Updated Vital Signs BP 133/88   Pulse 79   Temp 98.6 F (37 C) (Oral)   Resp 20   Ht 5' 10.5" (1.791 m)   Wt 118.8 kg   SpO2 93%   BMI 37.06 kg/m   Physical Exam Vitals and nursing note reviewed.  Constitutional:      Appearance: He is well-developed.  HENT:     Head: Normocephalic and atraumatic.  Eyes:     Pupils: Pupils are equal, round, and reactive to light.  Neck:     Vascular: No JVD.  Cardiovascular:     Rate and Rhythm: Normal rate and regular rhythm.     Heart sounds: No murmur heard.   No friction rub. No gallop.  Pulmonary:     Effort: No respiratory distress.     Breath sounds: No wheezing.  Chest:     Chest wall: Tenderness present.  Comments: Tells me that he is tender over the sternum with palpation though seems to have more tenderness to the epigastrium. Abdominal:     General: There is no distension.     Tenderness: There is abdominal tenderness. There is no guarding or rebound.     Comments: Tenderness to the epigastrium, no significant pain in the right upper quadrant negative Murphy sign.  Musculoskeletal:        General: Normal range of motion.     Cervical back: Normal range of motion and neck supple.  Skin:    Coloration: Skin is not pale.     Findings: No rash.  Neurological:     Mental Status: He is alert and oriented to person, place, and time.  Psychiatric:        Behavior: Behavior normal.    ED  Results / Procedures / Treatments   Labs (all labs ordered are listed, but only abnormal results are displayed) Labs Reviewed  CBC WITH DIFFERENTIAL/PLATELET - Abnormal; Notable for the following components:      Result Value   WBC 10.9 (*)    Neutro Abs 9.0 (*)    All other components within normal limits  COMPREHENSIVE METABOLIC PANEL - Abnormal; Notable for the following components:   Glucose, Bld 253 (*)    All other components within normal limits  TROPONIN I (HIGH SENSITIVITY) - Abnormal; Notable for the following components:   Troponin I (High Sensitivity) 432 (*)    All other components within normal limits  TROPONIN I (HIGH SENSITIVITY) - Abnormal; Notable for the following components:   Troponin I (High Sensitivity) 1,427 (*)    All other components within normal limits  RESP PANEL BY RT-PCR (FLU A&B, COVID) ARPGX2  LIPASE, BLOOD  PROTIME-INR  APTT  APTT    EKG EKG Interpretation  Date/Time:  Wednesday August 20 2020 05:27:13 EDT Ventricular Rate:  86 PR Interval:  162 QRS Duration: 94 QT Interval:  371 QTC Calculation: 444 R Axis:   146 Text Interpretation: Sinus rhythm Anterior infarct, old No significant change since last tracing Confirmed by Melene PlanFloyd, Yon Schiffman [phone]) on 08/20/2020 5:29:15 AM  Radiology DG Chest Port 1 View  Result Date: 08/20/2020 CLINICAL DATA:  Cough with mid to left-sided chest pain x3 days. EXAM: PORTABLE CHEST 1 VIEW COMPARISON:  None. FINDINGS: Decreased lung volumes are seen which is likely secondary to the degree of patient inspiration. Mild areas of atelectasis and/or infiltrate noted within the bilateral lung bases. There is no evidence of a pleural effusion or pneumothorax. The heart size and mediastinal contours are within normal limits. The visualized skeletal structures are unremarkable. IMPRESSION: Decreased lung volumes with mild bibasilar atelectasis and/or infiltrate. Electronically Signed   By: Aram Candelahaddeus  Houston M.D.   On: 08/20/2020  03:34    Procedures Procedures   Medications Ordered in ED Medications  nitroGLYCERIN 50 mg in dextrose 5 % 250 mL (0.2 mg/mL) infusion (10 mcg/min Intravenous Rate/Dose Change 08/20/20 0538)  morphine 2 MG/ML injection 2 mg (2 mg Intravenous Given 08/20/20 0542)  bivalirudin (ANGIOMAX) BOLUS via infusion (11.9 mg Intravenous Bolus from Bag 08/20/20 0435)    And  bivalirudin (ANGIOMAX) 250 mg in sodium chloride 0.9 % 50 mL (5 mg/mL) infusion (1.75 mg/kg/hr  92 kg (Adjusted) Intravenous New Bag/Given 08/20/20 0431)  aspirin chewable tablet 324 mg (324 mg Oral Given 08/20/20 0205)  nitroGLYCERIN (NITROSTAT) SL tablet 0.4 mg (0.4 mg Sublingual Given 08/20/20 0218)  alum & mag hydroxide-simeth (MAALOX/MYLANTA) 200-200-20 MG/5ML suspension  30 mL (30 mLs Oral Given 08/20/20 0207)    ED Course  I have reviewed the triage vital signs and the nursing notes.  Pertinent labs & imaging results that were available during my care of the patient were reviewed by me and considered in my medical decision making (see chart for details).    MDM Rules/Calculators/A&P                           49 yo M with a chief complaints of chest pain.  This been ongoing for greater than 48 hours.  Nothing seems to make it better or worse.  Feels like reflux to him but is slightly worse.  Not exertional.  Initial EKG with change in slope, no significant ST elevation and no noted reciprocal changes.  Will obtain a laboratory evaluation chest x-ray reassess.  Mild leukocytosis.  Troponin 432.  Will discuss with cardiology.  Start on nitro.  Heparin allergy will discuss with pharmacy.  Patient still having about 6 out of 10 pain.  Had some modest improvement with nitro.  Tells me that he gets worse if he moves at all better if he holds still.  Discussed with cardiology fellow, Dr. Cherly Beach he also reviewed the EKG and does not see an ST elevation MI.  Recommending cardiology admit over at [facility].   Patient continuing to  have some chest pain off and on.  Second troponin is trending upward from the first.  Rediscussed with cards fellow recommends ED to ED transfer.  Discussed with Dr. Wilkie Aye accepts in transfer.  CRITICAL CARE Performed by: Rae Roam   Total critical care time: 80 minutes  Critical care time was exclusive of separately billable procedures and treating other patients.  Critical care was necessary to treat or prevent imminent or life-threatening deterioration.  Critical care was time spent personally by me on the following activities: development of treatment plan with patient and/or surrogate as well as nursing, discussions with consultants, evaluation of patient's response to treatment, examination of patient, obtaining history from patient or surrogate, ordering and performing treatments and interventions, ordering and review of laboratory studies, ordering and review of radiographic studies, pulse oximetry and re-evaluation of patient's condition.  The patients results and plan were reviewed and discussed.   Any x-rays performed were independently reviewed by myself.   Differential diagnosis were considered with the presenting HPI.  Medications  nitroGLYCERIN 50 mg in dextrose 5 % 250 mL (0.2 mg/mL) infusion (10 mcg/min Intravenous Rate/Dose Change 08/20/20 0538)  morphine 2 MG/ML injection 2 mg (2 mg Intravenous Given 08/20/20 0542)  bivalirudin (ANGIOMAX) BOLUS via infusion (11.9 mg Intravenous Bolus from Bag 08/20/20 0435)    And  bivalirudin (ANGIOMAX) 250 mg in sodium chloride 0.9 % 50 mL (5 mg/mL) infusion (1.75 mg/kg/hr  92 kg (Adjusted) Intravenous New Bag/Given 08/20/20 0431)  aspirin chewable tablet 324 mg (324 mg Oral Given 08/20/20 0205)  nitroGLYCERIN (NITROSTAT) SL tablet 0.4 mg (0.4 mg Sublingual Given 08/20/20 0218)  alum & mag hydroxide-simeth (MAALOX/MYLANTA) 200-200-20 MG/5ML suspension 30 mL (30 mLs Oral Given 08/20/20 0207)    Vitals:   08/20/20 0345 08/20/20  0400 08/20/20 0430 08/20/20 0445  BP: (!) 154/99 (!) 157/98 (!) 146/93 133/88  Pulse: 89 75 77 79  Resp: (!) 30 (!) 24 (!) 25 20  Temp:      TempSrc:      SpO2: 96% 94% 95% 93%  Weight:  Height:        Final diagnoses:  NSTEMI (non-ST elevated myocardial infarction) [facility])    Admission/ observation were discussed with the admitting physician, patient and/or family and they are comfortable with the plan.   Final Clinical Impression(s) / ED Diagnoses Final diagnoses:  NSTEMI (non-ST elevated myocardial infarction) [facility])    Rx / DC Orders ED Discharge Orders     None        Melene Plan, DO 08/20/20 0354    Melene Plan, DO 08/20/20 [phone]

## 2020-08-21 ENCOUNTER — Other Ambulatory Visit (HOSPITAL_COMMUNITY): Payer: Self-pay

## 2020-08-21 ENCOUNTER — Encounter (HOSPITAL_COMMUNITY): Payer: Self-pay | Admitting: Interventional Cardiology

## 2020-08-21 LAB — CBC
HCT: 38.8 % — ABNORMAL LOW (ref 39.0–52.0)
Hemoglobin: 13.1 g/dL (ref 13.0–17.0)
MCH: 30.5 pg (ref 26.0–34.0)
MCHC: 33.8 g/dL (ref 30.0–36.0)
MCV: 90.2 fL (ref 80.0–100.0)
Platelets: 235 10*3/uL (ref 150–400)
RBC: 4.3 MIL/uL (ref 4.22–5.81)
RDW: 13.5 % (ref 11.5–15.5)
WBC: 10.1 10*3/uL (ref 4.0–10.5)
nRBC: 0 % (ref 0.0–0.2)

## 2020-08-21 LAB — BASIC METABOLIC PANEL
Anion gap: 7 (ref 5–15)
BUN: 8 mg/dL (ref 6–20)
CO2: 23 mmol/L (ref 22–32)
Calcium: 8.7 mg/dL — ABNORMAL LOW (ref 8.9–10.3)
Chloride: 105 mmol/L (ref 98–111)
Creatinine, Ser: 0.78 mg/dL (ref 0.61–1.24)
GFR, Estimated: >60 mL/min (ref >60)
Glucose, Bld: 145 mg/dL — ABNORMAL HIGH (ref 70–99)
Potassium: 4.2 mmol/L (ref 3.5–5.1)
Sodium: 135 mmol/L (ref 135–145)

## 2020-08-21 LAB — GLUCOSE, CAPILLARY
Glucose-Capillary: 174 mg/dL — ABNORMAL HIGH (ref 70–99)
Glucose-Capillary: 176 mg/dL — ABNORMAL HIGH (ref 70–99)

## 2020-08-21 LAB — LIPID PANEL
Cholesterol: 146 mg/dL (ref 0–200)
HDL: 42 mg/dL (ref >40)
LDL Cholesterol: 93 mg/dL (ref 0–99)
Total CHOL/HDL Ratio: 3.5 RATIO
Triglycerides: 57 mg/dL (ref <150)
VLDL: 11 mg/dL (ref 0–40)

## 2020-08-21 MED ORDER — GLIPIZIDE 10 MG PO TABS
5.0000 mg | ORAL_TABLET | Freq: Every day | ORAL | 0 refills | Status: DC
  Filled 2020-08-21: qty 15, 30d supply, fill #0

## 2020-08-21 MED ORDER — METFORMIN HCL 1000 MG PO TABS
1000.0000 mg | ORAL_TABLET | Freq: Two times a day (BID) | ORAL | 0 refills | Status: DC
  Filled 2020-08-21: qty 60, 30d supply, fill #0

## 2020-08-21 MED ORDER — CLOPIDOGREL BISULFATE 75 MG PO TABS
75.0000 mg | ORAL_TABLET | Freq: Every day | ORAL | 1 refills | Status: DC
  Filled 2020-08-21: qty 30, 30d supply, fill #0

## 2020-08-21 MED ORDER — AMLODIPINE BESYLATE 5 MG PO TABS
5.0000 mg | ORAL_TABLET | Freq: Every day | ORAL | 1 refills | Status: DC
  Filled 2020-08-21: qty 30, 30d supply, fill #0

## 2020-08-21 MED ORDER — ATORVASTATIN CALCIUM 80 MG PO TABS
80.0000 mg | ORAL_TABLET | Freq: Every day | ORAL | 1 refills | Status: DC
  Filled 2020-08-21: qty 30, 30d supply, fill #0

## 2020-08-21 MED ORDER — RIVAROXABAN 20 MG PO TABS
20.0000 mg | ORAL_TABLET | Freq: Every day | ORAL | Status: DC
  Administered 2020-08-21: 20 mg via ORAL
  Filled 2020-08-21: qty 1

## 2020-08-21 MED ORDER — METOPROLOL SUCCINATE ER 25 MG PO TB24
12.5000 mg | ORAL_TABLET | Freq: Every day | ORAL | 1 refills | Status: DC
  Filled 2020-08-21: qty 15, 30d supply, fill #0

## 2020-08-21 MED ORDER — METOPROLOL SUCCINATE ER 25 MG PO TB24
12.5000 mg | ORAL_TABLET | Freq: Every day | ORAL | Status: DC
  Administered 2020-08-21: 12.5 mg via ORAL
  Filled 2020-08-21: qty 1

## 2020-08-21 MED ORDER — PANTOPRAZOLE SODIUM 40 MG PO TBEC
40.0000 mg | DELAYED_RELEASE_TABLET | Freq: Every day | ORAL | 0 refills | Status: DC
  Filled 2020-08-21: qty 30, 30d supply, fill #0

## 2020-08-21 MED ORDER — LISINOPRIL 20 MG PO TABS
20.0000 mg | ORAL_TABLET | Freq: Every day | ORAL | 1 refills | Status: DC
  Filled 2020-08-21: qty 30, 30d supply, fill #0

## 2020-08-21 MED ORDER — ASPIRIN 81 MG PO TBEC
81.0000 mg | DELAYED_RELEASE_TABLET | Freq: Every day | ORAL | 0 refills | Status: DC
  Filled 2020-08-21: qty 30, 30d supply, fill #0

## 2020-08-21 MED ORDER — NITROGLYCERIN 0.4 MG SL SUBL
0.4000 mg | SUBLINGUAL_TABLET | SUBLINGUAL | 1 refills | Status: DC | PRN
  Filled 2020-08-21: qty 25, 7d supply, fill #0

## 2020-08-21 MED FILL — Heparin Sod (Porcine)-NaCl IV Soln 1000 Unit/500ML-0.9%: INTRAVENOUS | Qty: 1000 | Status: AC

## 2020-08-21 NOTE — Progress Notes (Signed)
Discharge instructions (including medications) discussed with and copy provided to patient/caregiver 

## 2020-08-21 NOTE — Progress Notes (Addendum)
Pt's HR decreased to 34 BPM non-sustained with correlating O2 saturations @ 86% while sleeping. Pt placed on 2L [place], resting comfortably with HR sustaining at 62 BPM, O2 saturations @ 96%. Will continue to monitor.  Bari Edward, RN

## 2020-08-21 NOTE — Discharge Summary (Addendum)
Discharge Summary    Patient ID: Greg Quinn MRN: [id number]; DOB: October 25, 1970  Admit date: 08/20/2020 Discharge date: 08/21/2020  PCP:  Patient, No Pcp Per (Inactive)   [facility] Providers Cardiologist:  Thurmon Fair, MD   {     Discharge Diagnoses    Active Problems:   NSTEMI (non-ST elevated myocardial infarction) (HCC)   Uncontrolled diabetes mellitus (HCC)   Hypertension   Hyperlipidemia LDL goal <70   DVT, lower extremity (HCC)   Current every day smoker   Bilateral pulmonary embolism (HCC)   Unstable angina [facility])    Diagnostic Studies/Procedures    LHC on 08/20/20:    Mid Cx lesion is 90% stenosed.   Dist RCA lesion is 40% stenosed.   Prox LAD to Mid LAD lesion is 25% stenosed.   A drug-eluting stent was successfully placed using a STENT ONYX FRONTIER 4.0X26, postdilated to 4.5 mm.   Post intervention, there is a 0% residual stenosis.   The left ventricular systolic function is normal.   LV end diastolic pressure is mildly elevated.   The left ventricular ejection fraction is 55-65% by visual estimate.   There is no aortic valve stenosis.   Recommend to resume Rivaroxaban, at currently prescribed dose and frequency on 08/21/2020. Recommend concurrent antiplatelet therapy of Aspirin 81 mg for 1 month and Clopidogrel  daily for 12 months.     Loading dose of clopidogrel ordered for tomorrow.  Brilinta was given at the time of PCI, but will need to be changed to clopidogrel due to Xarelto.     Aggressive secondary prevention including smoking cessation.   Diagnostic Dominance: Right    Intervention    _____________   Echo from 08/20/20:   1. Left ventricular ejection fraction, by estimation, is 60 to 65%. The  left ventricle has normal function. The left ventricle has no regional  wall motion abnormalities. There is mild left ventricular hypertrophy.  Left ventricular diastolic parameters were normal.   2. Right ventricular  systolic function is normal. The right ventricular  size is normal. Tricuspid regurgitation signal is inadequate for assessing  PA pressure.   3. The mitral valve is normal in structure. Trivial mitral valve  regurgitation. No evidence of mitral stenosis.   4. The aortic valve is tricuspid. Aortic valve regurgitation is not  visualized. No aortic stenosis is present.   5. The inferior vena cava is dilated in size with >50% respiratory  variability, suggesting right atrial pressure of 8 mmHg.   History of Present Illness     Greg Quinn is a 50 y.o. 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   Per admission H&P:  He was maintained on xarelto for hx of DVT. Was seen by PCP 08/01/20 and A1c not at goal > 8.0. He had been out of diabetic medications for 40 days. HTN was controlled on amlodipine and lisinopril. HLD managed with 40 mg lipitor, but LDL was 152. 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 He had a history of extensive DVT in his left lower extremity --> right lower extremity and bilateral PE. He had an IVC filter placed. He was taking coumadin, but transitioned to xarelto and has done well.  He reported seeing a cardiologist after his PE/DVT, but has had no other cardiac problems. He had a stress test in 2017 in [place] that was "fine."  He presented to [facility] with complaints of CP. CP started on Sunday 7/24 night when he was getting ready for bed. CP started whey he got into bed in the recumbent position. CP located in his left lower chest with radiation to his jaw. He sat up and eventually the CP subsided and he was able to sleep. Monday he did not have CP, but CP recurred Monday 7/25 evening in the same fashion. He was again able to sleep. Tues 7/26 evening, he developed CP with worsening intensity as a 9/10, with diaphoresis, radiation to his jaw, and  nausea. CP did not subside and he presented to [facility].   He has a 25 pack year history. He is divorced and has 8 children, youngest is 52 yo. His girlfriend is at bedside. He works a physically demanding job at a [employer], has not had chest pain leading up to last weekend. No family history of heart disease, but DM in both parents.   Upon arrival, EKG with rounded and elevated T wave in inferior leads and Q waves anteriorly, improved on subsequent tracings. He was started on angiomax and nitro gtt. CP has reduced to a 7/10 and is worse when he is resting flat. CP moves lower in his chest when he sits up.     Hospital Course     Consultants: N/A  NSTEMI CAD -Presented with 4 days onset of chest pain triggered by exertion -High sensitive troponin 432 >1427 >10331 >8707 -Initial EKG revealed slight upsloping ST segment of inferior leads which subsided on subsequent EKGs, poor R wave progression concerning for ischemic change - LHC 08/20/20: mid Cx 90 % stenosis treated with DES, dist RCA 40% stenosis, prox-mid LAD 25% stenosis, mildly elevated LVEDP 18 mmHg, EF 55-65% -Echo 08/20/20: 60 to 65%, no regional wall motion abnormality, trivial MR -Medical therapy: Aspirin 81 g and Plavix 75 mg and Xarelto 20 mg for 1 month, then Plavix 75 mg and Xarelto 20 mg for 21-month, Lipitor increased from 40 to 80 mg daily, continue lisinopril 20 mg daily, started metoprolol 12.5 mg twice daily, will reduce to XL 12.5mg  daily at discharge given nocturnal bradycardia 40-50s -Will start 1 month Protonix for GI prophylaxis, discussed bleeding precaution   -Discussed lifestyle modification including smoking cessation, optimal risk factor control of diabetes and hyperlipidemia -Discussed proper post cath site care and return to work in 2 month - Follow up with [facility] heart care scheduled on 09/08/20 at 11 PM with APP  - All scripts will be sent to [facility] pharmacy today per patient request   HTN -Blood  pressure control improving today, continue amlodipine, lisinopril, metoprolol  HLD - LDL 93, lipitor increased from 40 to 80 mg daily for high intensity reduction  Uncontrolled type 2 DM with hyperglycemia  -Hemoglobin A1c 9.5% on 08/01/20  -He ran out of diabetic medicine for 40 days prior to current admission -He was supposed to take metformin and semaglutide at home, glipizide recently added by PCP, he wants script for metformin and glipizide so he does not need to pick up at pharmacy, OK to resume metformin 48 hours after cath on 08/23/20  -will need to repeat A1c in 3 months to assess type 2 diabetes, if not adequately controlled, consider adding insulin  Nocturnal hypoxia/bradycardia  - advised patient to see PCP and complete sleep study   Tobacco abuse -Strongly recommended smoking cessation  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 - Yes ADP Receptor Inhibitor (Plavix/Clopidogrel, Brilinta/Ticagrelor or Effient/Prasugrel) prescribed (includes medically managed patients)? - Yes Beta Blocker prescribed? - Yes High Intensity Statin (Lipitor 40-80mg  or Crestor 20-40mg ) prescribed? - Yes EF assessed during THIS hospitalization? - Yes For EF <40%, was ACEI/ARB prescribed? - Yes For EF <40%, Aldosterone Antagonist (Spironolactone or Eplerenone) prescribed? - Not Applicable (EF >/= 40%) Cardiac Rehab Phase II ordered (including medically managed patients)? - Yes      _____________  Discharge Vitals Blood pressure (!) 127/96, pulse 85, temperature 98.1 F (36.7 C), temperature source Oral, resp. rate 18, height 5' 10.5" (1.791 m), weight 123 kg, SpO2 96 %.  Filed Weights   08/20/20 0206 08/21/20 0511   Weight: 118.8 kg 123 kg   Vitals:  Vitals:   08/21/20 0511 08/21/20 0800  BP: 125/80 (!) 127/96  Pulse: 73 85  Resp: 18 18  Temp: 98.6 F (37 C) 98.1 F (36.7 C)  SpO2: 98% 96%   General Appearance: In no apparent distress, laying in bed HEENT: Normocephalic, atraumatic. EOMs intact.  Neck: Supple, trachea midline, no JVDs Cardiovascular: Regular rate and rhythm, normal S1-S2,  no murmur/rub/gallop Respiratory: Resting breathing unlabored, lungs sounds clear to auscultation bilaterally, no use of accessory muscle. Gastrointestinal: Bowel sounds positive, abdomen soft, non-tender, non-distended.  Extremities: Able to move all extremities in bed without difficulty, no edema/cyanosis/clubbing Genitourinary: genital exam not performed Musculoskeletal: Normal muscle bulk and tone, muscle strength 5/5 throughout Skin: Intact, warm, dry. No rashes or petechiae noted in exposed areas. Right wrist cath site with dressing, radial pulse +, no neurovascular deficit, no bleeding/bruise/hematoma  Neurologic: Alert, oriented to person, place and time. Fluent speech, no cognitive deficit, no gross focal neuro deficit Psychiatric: Normal affect. Mood is appropriate.    Labs & Radiologic Studies    CBC Recent Labs    08/20/20 0209 08/21/20 0207  WBC 10.9* 10.1  NEUTROABS 9.0*  --   HGB 13.5 13.1  HCT 40.6 38.8*  MCV 91.0 90.2  PLT 240 235   Basic Metabolic Panel Recent Labs    41/96/22 0209 08/21/20 0207  NA 137 135  K 3.7 4.2  CL 100 105  CO2 25 23  GLUCOSE 253* 145*  BUN 13 8  CREATININE 0.85 0.78  CALCIUM 9.4 8.7*   Liver Function Tests Recent Labs    08/20/20 0209  AST 31  ALT 30  ALKPHOS 96  BILITOT 0.5  PROT 7.3  ALBUMIN 4.1   Recent Labs    08/20/20 0209  LIPASE 32   High Sensitivity Troponin:   Recent Labs  Lab 08/20/20 0209 08/20/20 0349 08/20/20 1525 08/20/20 1642  TROPONINIHS 432* 1,427* 10,331* 8,707*    BNP Invalid input(s):  POCBNP D-Dimer No results for input(s): DDIMER in the last 72 hours. Hemoglobin A1C No results for input(s): HGBA1C in the last 72 hours. Fasting Lipid Panel Recent Labs    08/21/20 0207  CHOL 146  HDL 42  LDLCALC 93  TRIG 57  CHOLHDL 3.5   Thyroid Function Tests No results for input(s): TSH, T4TOTAL, T3FREE, THYROIDAB in the last 72 hours.  Invalid input(s): FREET3 _____________  CARDIAC CATHETERIZATION  Result Date: 08/20/2020 Formatting of this result is different from the original.   Mid Cx lesion is 90% stenosed.   Dist RCA lesion is 40% stenosed.   Prox LAD to Mid LAD lesion is 25% stenosed.   A drug-eluting stent was successfully placed using a STENT ONYX FRONTIER 4.0X26, postdilated to 4.5 mm.   Post intervention, there is a 0% residual stenosis.   The left ventricular systolic function is normal.   LV end diastolic pressure is mildly elevated.   The left ventricular ejection fraction is 55-65% by visual estimate.   There is no aortic valve stenosis. Recommend to resume Rivaroxaban, at currently prescribed dose and frequency on 08/21/2020. Recommend concurrent antiplatelet therapy of Aspirin 81 mg for 1 month and Clopidogrel 75mg  daily for 12 months.  Loading dose of clopidogrel ordered for tomorrow.  Brilinta was given at the time of PCI, but will need to be changed to clopidogrel due to Xarelto.  Aggressive secondary prevention including smoking cessation.   DG Chest Port 1 View  Result Date: 08/20/2020 CLINICAL DATA:  Cough with mid to left-sided chest pain x3 days. EXAM: PORTABLE CHEST 1 VIEW COMPARISON:  None. FINDINGS: Decreased lung volumes are seen which is likely secondary to the degree of patient inspiration. Mild areas of atelectasis and/or infiltrate noted within the bilateral lung bases. There is no evidence of a pleural effusion or pneumothorax. The heart size and mediastinal contours are within normal limits. The visualized skeletal structures are unremarkable.  IMPRESSION: Decreased lung volumes with mild bibasilar atelectasis and/or infiltrate. Electronically Signed   By: 08/22/2020 M.D.   On: 08/20/2020 03:34   ECHOCARDIOGRAM COMPLETE  Result Date: 08/20/2020    ECHOCARDIOGRAM REPORT   Patient Name:   NAITHEN RIVENBURG Date of Exam: 08/20/2020 Medical Rec #:  08/22/2020                Height:       70.5 in Accession #:    [id number]               Weight:       262.0 lb Date of Birth:  [birth year]                BSA:          2.354 m Patient Age:    50 years                 BP:           121/86 mmHg Patient Gender: M                        HR:           83 bpm. Exam Location:  Inpatient Procedure: 2D Echo, Cardiac Doppler and Color Doppler Indications:    NSTEMI  History:        Patient has no prior history of Echocardiogram examinations.                 Risk Factors:Dyslipidemia, Diabetes, Hypertension and Current                 Smoker. Hx DVT/PE. S/p PCI.  Sonographer:    02/15/1970 RDCS (AE) Referring Phys: Ross Ludwig ANGELA NICOLE DUKE IMPRESSIONS  1. Left ventricular ejection fraction, by estimation, is 60 to 65%. The left ventricle has normal function. The left ventricle has no regional wall motion abnormalities. There is mild left ventricular hypertrophy.  Left ventricular diastolic parameters were normal.  2. Right ventricular systolic function is normal. The right ventricular size is normal. Tricuspid regurgitation signal is inadequate for assessing PA pressure.  3. The mitral valve is normal in structure. Trivial mitral valve regurgitation. No evidence of mitral stenosis.  4. The aortic valve is tricuspid. Aortic valve regurgitation is not visualized. No aortic stenosis is present.  5. The inferior vena cava is dilated in size with >50% respiratory variability, suggesting right atrial pressure of 8 mmHg. FINDINGS  Left Ventricle: Left ventricular ejection fraction, by estimation, is 60 to 65%. The left ventricle has normal function. The left  ventricle has no regional wall motion abnormalities. The left ventricular internal cavity size was normal in size. There is  mild left ventricular hypertrophy. Left ventricular diastolic parameters were normal. Right Ventricle: The right ventricular size is normal. No increase in right ventricular wall thickness. Right ventricular systolic function is normal. Tricuspid regurgitation signal is inadequate for assessing PA pressure. Left Atrium: Left atrial size was normal in size. Right Atrium: Right atrial size was normal in size. Pericardium: There is no evidence of pericardial effusion. Mitral Valve: The mitral valve is normal in structure. Trivial mitral valve regurgitation. No evidence of mitral valve stenosis. MV peak gradient, 6.1 mmHg. The mean mitral valve gradient is 2.0 mmHg. Tricuspid Valve: The tricuspid valve is normal in structure. Tricuspid valve regurgitation is trivial. Aortic Valve: The aortic valve is tricuspid. Aortic valve regurgitation is not visualized. No aortic stenosis is present. Aortic valve mean gradient measures 6.0 mmHg. Aortic valve peak gradient measures 10.0 mmHg. Aortic valve area, by VTI measures 2.11  cm. Pulmonic Valve: The pulmonic valve was not well visualized. Pulmonic valve regurgitation is not visualized. Aorta: The aortic root and ascending aorta are structurally normal, with no evidence of dilitation. Venous: The inferior vena cava is dilated in size with greater than 50% respiratory variability, suggesting right atrial pressure of 8 mmHg. IAS/Shunts: The interatrial septum was not well visualized.  LEFT VENTRICLE PLAX 2D LVIDd:         3.90 cm  Diastology LVIDs:         2.20 cm  LV e' medial:    12.40 cm/s LV PW:         1.50 cm  LV E/e' medial:  6.6 LV IVS:        1.20 cm  LV e' lateral:   12.60 cm/s LVOT diam:     1.80 cm  LV E/e' lateral: 6.5 LV SV:         56 LV SV Index:   24 LVOT Area:     2.54 cm  RIGHT VENTRICLE             IVC RV Basal diam:  2.90 cm     IVC  diam: 2.60 cm RV S prime:     15.90 cm/s TAPSE (M-mode): 2.0 cm LEFT ATRIUM             Index       RIGHT ATRIUM           Index LA diam:        2.70 cm 1.15 cm/m  RA Area:     15.00 cm LA Vol (A2C):   62.3 ml 26.47 ml/m RA Volume:   41.40 ml  17.59 ml/m LA Vol (A4C):   47.8 ml 20.31 ml/m LA Biplane Vol: 59.5 ml 25.28 ml/m  AORTIC VALVE AV Area (Vmax):    1.93 cm  AV Area (Vmean):   1.61 cm AV Area (VTI):     2.11 cm AV Vmax:           158.00 cm/s AV Vmean:          118.000 cm/s AV VTI:            0.268 m AV Peak Grad:      10.0 mmHg AV Mean Grad:      6.0 mmHg LVOT Vmax:         120.00 cm/s LVOT Vmean:        74.500 cm/s LVOT VTI:          0.222 m LVOT/AV VTI ratio: 0.83  AORTA Ao Root diam: 3.20 cm Ao Asc diam:  3.20 cm MITRAL VALVE MV Area (PHT): 3.65 cm    SHUNTS MV Area VTI:   1.99 cm    Systemic VTI:  0.22 m MV Peak grad:  6.1 mmHg    Systemic Diam: 1.80 cm MV Mean grad:  2.0 mmHg MV Vmax:       1.23 m/s MV Vmean:      63.9 cm/s MV Decel Time: 208 msec MV E velocity: 81.30 cm/s MV A velocity: 74.70 cm/s MV E/A ratio:  1.09 Epifanio Lescheshristopher Schumann MD Electronically signed by Epifanio Lescheshristopher Schumann MD Signature Date/Time: 08/20/2020/5:44:33 PM    Final    Disposition   Pt is being discharged home today in good condition.  Follow-up Plans & Appointments     Follow-up Information     [facility] Follow up on 09/08/2020.   Specialty: Cardiology Why: arrive at 11AM for your cardiology post-hospital follow up appointment with Ms Freddie BreechWalker Contact information: [address] [place] [phone]               Discharge Instructions     AMB Referral to Cardiac Rehabilitation - Phase II   Complete by: As directed    Diagnosis: NSTEMI   After initial evaluation and assessments completed: Virtual Based Care may be provided alone or in conjunction with Phase 2 Cardiac Rehab based on patient barriers.: Yes   Diet - low  sodium heart healthy   Complete by: As directed    Diet Carb Modified   Complete by: As directed    Discharge instructions   Complete by: As directed    PLEASE REMEMBER TO BRING ALL OF YOUR MEDICATIONS TO EACH OF YOUR FOLLOW-UP OFFICE VISITS.  PLEASE ATTEND ALL SCHEDULED FOLLOW-UP APPOINTMENTS.   Activity: Increase activity slowly as tolerated. You may shower, but no soaking baths (or swimming) for 1 week. No driving for 24 hours. No lifting over 5 lbs for 1 week. No sexual activity for 1 week.   You May Return to Work: in 2 week   Wound Care: You may wash cath site gently with soap and water. Keep cath site clean and dry. If you notice pain, swelling, bleeding or pus at your cath site, please call [phone].    PLEASE DO NOT MISS ANY DOSES OF YOUR ASPIRIN/PLAVIX !!!!!    Also keep a log of you blood pressures and bring back to your follow up appt. Please call the office with any questions.   Patients taking blood thinners should generally stay away from medicines like ibuprofen, Advil, Motrin, naproxen, and Aleve due to risk of stomach bleeding. You may take Tylenol as directed or talk to your primary doctor about alternatives.  PLEASE ENSURE THAT YOU DO NOT RUN OUT  OF YOUR ASPIRIN/PLAVIX. This medication is very important to remain on for at least 1 month. IF you have issues obtaining this medication due to cost please CALL the office 3-5 business days prior to running out in order to prevent missing doses of this medication.     Radial Site Care  Refer to this sheet in the next few weeks. These instructions provide you with information on caring for yourself after your procedure. Your caregiver may also give you more specific instructions. Your treatment has been planned according to current medical practices, but problems sometimes occur. Call your caregiver if you have any problems or questions after your procedure.  HOME CARE INSTRUCTIONS You may shower the day after the  procedure. Remove the bandage (dressing) and gently wash the site with plain soap and water. Gently pat the site dry.  Do not apply powder or lotion to the site.  Do not submerge the affected site in water for 3 to 5 days.  Inspect the site at least twice daily.  Do not flex or bend the affected arm for 24 hours.  No lifting over 5 pounds (2.3 kg) for 5 days after your procedure.  Do not drive home if you are discharged the same day of the procedure. Have someone else drive you.  You may drive 24 hours after the procedure unless otherwise instructed by your caregiver.   What to expect: Any bruising will usually fade within 1 to 2 weeks.  Blood that collects in the tissue (hematoma) may be painful to the touch. It should usually decrease in size and tenderness within 1 to 2 weeks.   SEEK IMMEDIATE MEDICAL CARE IF: You have unusual pain at the radial site.  You have redness, warmth, swelling, or pain at the radial site.  You have drainage (other than a small amount of blood on the dressing).  You have chills.  You have a fever or persistent symptoms for more than 72 hours.  You have a fever and your symptoms suddenly get worse.  Your arm becomes pale, cool, tingly, or numb.  You have heavy bleeding from the site. Hold pressure on the site.   Increase activity slowly   Complete by: As directed        Discharge Medications   Allergies as of 08/21/2020       Reactions   Heparin Other (See Comments)        Medication List     TAKE these medications    amLODipine 5 MG tablet Commonly known as: NORVASC Take 1 tablet (5 mg total) by mouth daily.   aspirin 81 MG EC tablet Take 1 tablet (81 mg total) by mouth daily. Swallow whole.   atorvastatin 80 MG tablet Commonly known as: LIPITOR Take 1 tablet (80 mg total) by mouth daily. What changed:  medication strength how much to take   clopidogrel 75 MG tablet Commonly known as: PLAVIX Take 1 tablet (75 mg total) by mouth  daily. Start taking on: August 22, 2020   glipiZIDE 5 MG tablet Commonly known as: GLUCOTROL Take 1 tablet (5 mg total) by mouth daily before breakfast. What changed:  how much to take when to take this   lisinopril 20 MG tablet Commonly known as: ZESTRIL Take 1 tablet (20 mg total) by mouth daily.   metFORMIN 1000 MG tablet Commonly known as: GLUCOPHAGE Take 1 tablet (1,000 mg total) by mouth 2 (two) times daily. Start on 08/23/20 Start taking on: August 23, 2020 What  changed:  additional instructions These instructions start on August 23, 2020. If you are unsure what to do until then, ask your doctor or other care provider.   metoprolol succinate 25 MG 24 hr tablet Commonly known as: TOPROL-XL Take 0.5 tablets (12.5 mg total) by mouth daily.   MULTIVITAMIN GUMMIES ADULT PO Take by mouth daily.   nitroGLYCERIN 0.4 MG SL tablet Commonly known as: Nitrostat Place 1 tablet (0.4 mg total) under the tongue every 5 (five) minutes as needed for chest pain.   OVER THE COUNTER MEDICATION Take 2 tablets by mouth daily. Alpha King fat burning pill 30 mins before largest meal.   pantoprazole 40 MG tablet Commonly known as: Protonix Take 1 tablet (40 mg total) by mouth daily.   Semaglutide 7 MG Tabs Take 1 tablet by mouth daily.   vitamin C 1000 MG tablet Take 1,000 mg by mouth 2 (two) times daily.   Xarelto 20 MG Tabs tablet Generic drug: rivaroxaban Take 20 mg by mouth daily.           Outstanding Labs/Studies     Duration of Discharge Encounter   Greater than 30 minutes including physician time.  Signed, Cyndi Bender, NP 08/21/2020, 9:22 AM  I have seen and examined the patient along with Cyndi Bender, NP.  I have reviewed the chart, notes and new data.  I agree with PA/NP's note.  Key new complaints: no angina, mild sharp discomfort with deep breaths only. Reports moderate daytime hypersomnolence Key examination changes: no rub, RRR, clear lungs, healthy R radial cath  site Key new findings / data: hypoxia and moderate sinus bradycardia periodically overnight. Trivial nonspecific residual ST changes inferior leads. Echo w normal EF and barely noticeable inferolateral hypokinesis ( my review of echo)  PLAN: DC home. He has a very physical job - no work for 2 weeks ASA+clopidogrel+rivaroxaban 30 days, then stop ASA. Clopidogrel for total 12 months. High dose statin. Beta blocker and ACei. Weight loss would help with glycemia control and may help suspected OSA. OP sleep study.  Thurmon Fair, MD, [facility] [phone] 08/21/2020, 9:28 AM

## 2020-08-21 NOTE — Plan of Care (Signed)
  Problem: Education: Goal: Knowledge of General Education information will improve Description: Including pain rating scale, medication(s)/side effects and non-pharmacologic comfort measures Outcome: Adequate for Discharge   

## 2020-08-21 NOTE — Progress Notes (Signed)
CARDIAC REHAB PHASE I   PRE:  Rate/Rhythm: 83 SR  BP:  Supine: 127/92  Sitting:   Standing:    SaO2: 99% 2L  MODE:  Ambulation: 721ft   POST:  Rate/Rhythm: 96 SR  BP:  Supine: 141/76  Sitting:   Standing:    SaO2: 95%RA hall and 96%RA room [phone] Pt walked 700 ft on RA with steady gait and tolerated well. C/o feeling a little sharpness in chest with deep breaths. Cardiologist aware. MI education completed with pt who voiced understanding. Discussed importance of plavix with stent. Reviewed NTG use, MI restrictions, heart healthy and low carb food choices, smoking cessation and calling 1800quitnow. Gave smoking cessation handout. Pt stated he has quit. Gave walking instructions for ex. Referred to [place] [facility] per pt's request. Pt does not think he can attend as he works during day in [employer].   Luetta Nutting, RN BSN  08/21/2020 9:25 AM

## 2020-08-22 LAB — HEMOGLOBIN A1C
Hgb A1c MFr Bld: 8.8 % — ABNORMAL HIGH (ref 4.8–5.6)
Mean Plasma Glucose: 206 mg/dL

## 2020-08-26 ENCOUNTER — Other Ambulatory Visit (HOSPITAL_COMMUNITY): Payer: Self-pay

## 2020-08-26 ENCOUNTER — Telehealth (HOSPITAL_COMMUNITY): Payer: Self-pay

## 2020-08-26 NOTE — Telephone Encounter (Signed)
Pharmacy Transitions of Care Follow-up Telephone Call  Date of discharge: 08/21/20  Discharge Diagnosis: NSTEMI  How have you been since you were released from the hospital?  Patient doing well since discharge. Currently staying with family members to help him.   Medication changes made at discharge:     START taking: Aspirin Low Dose (aspirin)  clopidogrel (PLAVIX)  metoprolol succinate (TOPROL-XL)  nitroGLYCERIN (Nitrostat)  pantoprazole (Protonix)   CHANGE how you take: atorvastatin (LIPITOR)  glipiZIDE (GLUCOTROL)  metFORMIN (GLUCOPHAGE)   Medication changes verified by the patient? Yes    Medication Accessibility:  Home Pharmacy:  [facility]  Was the patient provided with refills on discharged medications? Yes   Have all prescriptions been transferred from [facility] to home pharmacy?  Yes  Is the patient able to afford medications? Patient has Advance insurance  Medication Review:  CLOPIDOGREL (PLAVIX) Clopidogrel 75 mg once daily.  - Educated patient on expected duration of therapy of ASA 81mg  with clopidogrel.  - Advised patient of medications to avoid (NSAIDs, ASA)  - Educated that Tylenol (acetaminophen) will be the preferred analgesic to prevent risk of bleeding  - Emphasized importance of monitoring for signs and symptoms of bleeding (abnormal bruising, prolonged bleeding, nose bleeds, bleeding from gums, discolored urine, black tarry stools)  - Advised patient to alert all providers of anticoagulation therapy prior to starting a new medication or having a procedure   Follow-up Appointments:  PCP Hospital f/u appt confirmed? None currently scheduled  Specialist Hospital f/u appt confirmed? Scheduled to see Dr. on 09/08/20 @ Cardiology.   If their condition worsens, is the pt aware to call PCP or go to the Emergency Dept.? Yes  Final Patient Assessment: Patient has refills at home pharmacy and follow up scheduled.

## 2020-08-27 ENCOUNTER — Telehealth (HOSPITAL_COMMUNITY): Payer: Self-pay

## 2020-08-27 NOTE — Telephone Encounter (Signed)
Attempted to call patient in regards to Cardiac Rehab - LM on VM 

## 2020-08-27 NOTE — Telephone Encounter (Signed)
Pt insurance is active and benefits verified through [place]. Co-pay $0.00, DED $650.00/$8.43 met, out of pocket $3,900.00/$33.43 met, co-insurance 20%. No pre-authorization required. Passport, 08/27/20 @ 10:59AM, VOU#[id number]   Will contact patient to see if he is interested in the Cardiac Rehab Program. If interested, patient will need to complete follow up appt. Once completed, patient will be contacted for scheduling upon review by the RN Navigator.

## 2020-09-07 NOTE — Progress Notes (Deleted)
Office Visit    Patient Name: Greg Quinn Date of Encounter: 09/07/2020  PCP:  Patient, No Pcp Per (Inactive)   [facility]  Cardiologist:  Thurmon Fair, MD  Advanced Practice Provider:  No care team member to display Electrophysiologist:  None    Chief Complaint    Greg Quinn is a 50 y.o. 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 presents today for hospital follow up   Past Medical History    Past Medical History:  Diagnosis Date   Bilateral pulmonary embolism (HCC)    2009, on xarelto, IVC filter   Current every day smoker    25 pack year history   DVT, lower extremity (HCC)    2009   Hyperlipidemia LDL goal <70    Hypertension    NSTEMI (non-ST elevated myocardial infarction) (HCC) 08/20/2020   Uncontrolled diabetes mellitus (HCC)    Past Surgical History:  Procedure Laterality Date   CORONARY STENT INTERVENTION N/A 08/20/2020   Procedure: CORONARY STENT INTERVENTION;  Surgeon: Corky Crafts, MD;  Location: [facility] INVASIVE CV LAB;  Service: Cardiovascular;  Laterality: N/A;   INTRAVASCULAR ULTRASOUND/IVUS N/A 08/20/2020   Procedure: Intravascular Ultrasound/IVUS;  Surgeon: Corky Crafts, MD;  Location: [facility] INVASIVE CV LAB;  Service: Cardiovascular;  Laterality: N/A;   LEFT HEART CATH AND CORONARY ANGIOGRAPHY N/A 08/20/2020   Procedure: LEFT HEART CATH AND CORONARY ANGIOGRAPHY;  Surgeon: Corky Crafts, MD;  Location: [facility] INVASIVE CV LAB;  Service: Cardiovascular;  Laterality: N/A;    Allergies  Allergies  Allergen Reactions   Heparin Other (See Comments)    History of Present Illness    Tell Greg Quinn is a 50 y.o. 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  He was evaluated by his PCP 08/01/20 with A1c 9.5%  noted to be out of diabetic medications for 40 days. LDL of 152. Reports heparin allergy with suspected history of HIT. Histor yof extensive DVT in LLE, RLE, bilateral PE with IVC filter having been placed. Previously on Coumadin but has been transitioned to Xarelto. Previous stress test in 2017 in [place] was "fine" per patient report.  Presented to ED 08/20/20 with chest pain that started 08/17/20 while getting reading for bed. EKG on admission with rounded and elevated T wave in inferior leads, Q wave anteriorly. HS troponin 432 ? 1427 ? 10331 ? 8707. LHC 08/20/20 with mid Cx 90% stenosis treated with DES, distal RCA 40% stenosis, prox-mid LAD 25% stenosis, mildly elevated LVEDP EF 55-65%. He was recommended for Aspirin 81mg , Plavix 75mg , and Xarelto 20mg  for one month then Plavix 75mg  and Xarelto 20mg  for 12 months. His Atorvastatin was increased from 40mg  to 80mg  due to LDL of 93. His Metoprolol was reduced to Toprol 12.5mg  QD at discharge due to nocturnal bradycardia.   He is divorced and has 8 children with the youngest being 72 years old. He works at a .   He presents today for follow up. ***  EKGs/Labs/Other Studies Reviewed:   The following studies were reviewed today:   LHC on 08/20/20:     Mid Cx lesion is 90% stenosed.   Dist RCA lesion is 40% stenosed.   Prox LAD to Mid LAD lesion is 25% stenosed.  A drug-eluting stent was successfully placed using a STENT ONYX FRONTIER 4.0X26, postdilated to 4.5 mm.   Post intervention, there is a 0% residual stenosis.   The left ventricular systolic function is normal.   LV end diastolic pressure is mildly elevated.   The left ventricular ejection fraction is 55-65% by visual estimate.   There is no aortic valve stenosis.   Recommend to resume Rivaroxaban, at currently prescribed dose and frequency on 08/21/2020. Recommend concurrent antiplatelet therapy of Aspirin 81 mg for 1 month and Clopidogrel 75mg  daily for 12  months.     Loading dose of clopidogrel ordered for tomorrow.  Brilinta was given at the time of PCI, but will need to be changed to clopidogrel due to Xarelto.     Aggressive secondary prevention including smoking cessation.     Diagnostic Dominance: Right     Intervention     _____________   Echo from 08/20/20:    1. Left ventricular ejection fraction, by estimation, is 60 to 65%. The  left ventricle has normal function. The left ventricle has no regional  wall motion abnormalities. There is mild left ventricular hypertrophy.  Left ventricular diastolic parameters were normal.   2. Right ventricular systolic function is normal. The right ventricular  size is normal. Tricuspid regurgitation signal is inadequate for assessing  PA pressure.   3. The mitral valve is normal in structure. Trivial mitral valve  regurgitation. No evidence of mitral stenosis.   4. The aortic valve is tricuspid. Aortic valve regurgitation is not  visualized. No aortic stenosis is present.   5. The inferior vena cava is dilated in size with >50% respiratory  variability, suggesting right atrial pressure of 8 mmHg.  EKG:  EKG is ordered today.  The ekg ordered today demonstrates ***  Recent Labs: 08/20/2020: ALT 30 08/21/2020: BUN 8; Creatinine, Ser 0.78; Hemoglobin 13.1; Platelets 235; Potassium 4.2; Sodium 135  Recent Lipid Panel    Component Value Date/Time   CHOL 146 08/21/2020 0207   TRIG 57 08/21/2020 0207   HDL 42 08/21/2020 0207   CHOLHDL 3.5 08/21/2020 0207   VLDL 11 08/21/2020 0207   LDLCALC 93 08/21/2020 0207   Home Medications   No outpatient medications have been marked as taking for the 09/08/20 encounter (Appointment) with 09/10/20, NP.     Review of Systems      All other systems reviewed and are otherwise negative except as noted above.  Physical Exam    VS:  There were no vitals taken for this visit. , BMI There is no height or weight on file to calculate  BMI.  Wt Readings from Last 3 Encounters:  08/21/20 271 lb 1.6 oz (123 kg)  01/07/20 270 lb (122.5 kg)     GEN: Well nourished, well developed, in no acute distress. HEENT: normal. Neck: Supple, no JVD, carotid bruits, or masses. Cardiac: ***RRR, no murmurs, rubs, or gallops. No clubbing, cyanosis, edema.  ***Radials/PT 2+ and equal bilaterally.  Respiratory:  ***Respirations regular and unlabored, clear to auscultation bilaterally. GI: Soft, nontender, nondistended. MS: No deformity or atrophy. Skin: Warm and dry, no rash. Neuro:  Strength and sensation are intact. Psych: Normal affect.  Assessment & Plan    CAD s/p NSTEMI and DES to LCx 07/2020 -   HTN -   HLD - 08/21/20 LDL 93. Atorvastatin increased to 80mg  QD during recent admission. ***  DM2 - 07/2020 A1c 9.5. Continue to follow with PCP. ***  Nocturnal hypoxia/bradycardia - ***  Tobacco use - Smoking cessation encouraged. Recommend utilization of 1800QUITNOW. 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 Royann Shivers or APP.  Signed, Alver Sorrow, NP 09/07/2020, 6:17 PM [facility]

## 2020-09-08 ENCOUNTER — Ambulatory Visit (HOSPITAL_BASED_OUTPATIENT_CLINIC_OR_DEPARTMENT_OTHER): Payer: BC Managed Care – PPO | Admitting: Family

## 2020-09-17 ENCOUNTER — Telehealth: Payer: Self-pay | Admitting: Cardiovascular Disease

## 2020-09-17 DIAGNOSIS — I214 Non-ST elevation (NSTEMI) myocardial infarction: Secondary | ICD-10-CM | POA: Diagnosis not present

## 2020-09-17 DIAGNOSIS — Z1211 Encounter for screening for malignant neoplasm of colon: Secondary | ICD-10-CM | POA: Diagnosis not present

## 2020-09-17 DIAGNOSIS — Z1212 Encounter for screening for malignant neoplasm of rectum: Secondary | ICD-10-CM | POA: Diagnosis not present

## 2020-09-17 DIAGNOSIS — I10 Essential (primary) hypertension: Secondary | ICD-10-CM | POA: Diagnosis not present

## 2020-09-17 DIAGNOSIS — E119 Type 2 diabetes mellitus without complications: Secondary | ICD-10-CM | POA: Diagnosis not present

## 2020-09-17 NOTE — Telephone Encounter (Signed)
  Are you calling in reference to your FMLA or disability form? yes  What is your question in regards to FMLA or disability form? Patient would like to know if the forms can be faxed to the office and have it filled out so he can be out of work until he is seen by the office   Do you need copies of your medical records? no  Are you waiting on a nurse to call you back with results or are you wanting copies of your results? no    Please route to Medical Records or your medical records site representative

## 2020-09-19 ENCOUNTER — Encounter: Payer: Self-pay | Admitting: Emergency Medicine

## 2020-09-19 ENCOUNTER — Ambulatory Visit
Admission: EM | Admit: 2020-09-19 | Discharge: 2020-09-19 | Disposition: A | Discharge status: Home / Self Care | Payer: BC Managed Care – PPO | Attending: Emergency Medicine | Admitting: Emergency Medicine

## 2020-09-19 ENCOUNTER — Other Ambulatory Visit: Payer: Self-pay

## 2020-09-19 DIAGNOSIS — H60392 Other infective otitis externa, left ear: Secondary | ICD-10-CM | POA: Diagnosis not present

## 2020-09-19 DIAGNOSIS — H65192 Other acute nonsuppurative otitis media, left ear: Secondary | ICD-10-CM

## 2020-09-19 DIAGNOSIS — H65112 Acute and subacute allergic otitis media (mucoid) (sanguinous) (serous), left ear: Secondary | ICD-10-CM

## 2020-09-19 MED ORDER — NEOMYCIN-POLYMYXIN-HC 3.5-10000-1 OT SUSP: 3.0000 [drp] | Freq: Three times a day (TID) | OTIC | 0 refills | Status: AC

## 2020-09-19 MED ORDER — AMOXICILLIN 875 MG PO TABS: 875.0000 mg | ORAL_TABLET | Freq: Two times a day (BID) | ORAL | 0 refills | Status: AC

## 2020-09-19 NOTE — Discharge Instructions (Addendum)
Begin amoxicillin twice daily for 1 week Cortisporin drops 3 times daily x1 week Tylenol as needed for pain

## 2020-09-19 NOTE — ED Triage Notes (Signed)
Patient c/o LFT ear pain x 3 days.   Patient endorses "yellow" discharge.   Patient endorses hearing changes.   Patient endorses increased pain "when laying on it".   Patient has used warm compress and Tylenol w/ no relief of symptoms.

## 2020-09-19 NOTE — ED Provider Notes (Signed)
[facility]    CSN: [id number] Arrival date & time: 09/19/20  1323      History   Chief Complaint Chief Complaint  Patient presents with   Otalgia    HPI Greg Quinn is a 50 y.o. male history of recent MI, prior PE, tobacco use, presenting today for evaluation of ear pain.  Reports over the past 2 to 3 days has developed discomfort in his left ear.  Reports decreased hearing as well as yellow drainage from the left side.  Denies injury or trauma.  Denies recent URI symptoms, nasal congestion.  HPI  Past Medical History:  Diagnosis Date   Bilateral pulmonary embolism (HCC)    2009, on xarelto, IVC filter   Current every day smoker    25 pack year history   DVT, lower extremity (HCC)    2009   Hyperlipidemia LDL goal <70    Hypertension    NSTEMI (non-ST elevated myocardial infarction) (HCC) 08/20/2020   Uncontrolled diabetes mellitus [facility])     Patient Active Problem List   Diagnosis Date Noted   NSTEMI (non-ST elevated myocardial infarction) (HCC) 08/20/2020   Uncontrolled diabetes mellitus (HCC) 08/20/2020   Hypertension 08/20/2020   Hyperlipidemia LDL goal <70 08/20/2020   DVT, lower extremity (HCC) 08/20/2020   Current every day smoker 08/20/2020   Bilateral pulmonary embolism (HCC) 08/20/2020   Unstable angina (HCC) 08/20/2020    Past Surgical History:  Procedure Laterality Date   CORONARY STENT INTERVENTION N/A 08/20/2020   Procedure: CORONARY STENT INTERVENTION;  Surgeon: Corky Crafts, MD;  Location: [facility] INVASIVE CV LAB;  Service: Cardiovascular;  Laterality: N/A;   INTRAVASCULAR ULTRASOUND/IVUS N/A 08/20/2020   Procedure: Intravascular Ultrasound/IVUS;  Surgeon: Corky Crafts, MD;  Location: [facility] INVASIVE CV LAB;  Service: Cardiovascular;  Laterality: N/A;   LEFT HEART CATH AND CORONARY ANGIOGRAPHY N/A 08/20/2020   Procedure: LEFT HEART CATH AND CORONARY ANGIOGRAPHY;  Surgeon: Corky Crafts, MD;  Location: [facility] INVASIVE  CV LAB;  Service: Cardiovascular;  Laterality: N/A;       Home Medications    Prior to Admission medications   Medication Sig Start Date End Date Taking? Authorizing Provider  amLODipine (NORVASC) 5 MG tablet Take 1 tablet (5 mg total) by mouth daily. 08/21/20  Yes Cyndi Bender, NP  amoxicillin (AMOXIL) 875 MG tablet Take 1 tablet (875 mg total) by mouth 2 (two) times daily for 7 days. 09/19/20 09/26/20 Yes Onetta Spainhower C, PA-C  Ascorbic Acid (VITAMIN C) 1000 MG tablet Take 1,000 mg by mouth 2 (two) times daily.   Yes [provider]  aspirin 81 MG EC tablet Take 1 tablet (81 mg total) by mouth daily. Swallow whole. 08/21/20  Yes Cyndi Bender, NP  atorvastatin (LIPITOR) 80 MG tablet Take 1 tablet (80 mg total) by mouth daily. 08/21/20  Yes Cyndi Bender, NP  clopidogrel (PLAVIX) 75 MG tablet Take 1 tablet (75 mg total) by mouth daily. 08/22/20  Yes Cyndi Bender, NP  glipiZIDE (GLUCOTROL) 10 MG tablet Take 0.5 tablets (5 mg total) by mouth daily before breakfast. 08/21/20  Yes Cyndi Bender, NP  lisinopril (ZESTRIL) 20 MG tablet Take 1 tablet (20 mg total) by mouth daily. 08/21/20  Yes Cyndi Bender, NP  metFORMIN (GLUCOPHAGE) 1000 MG tablet Take 1 tablet (1,000 mg total) by mouth 2 (two) times daily. Start on 08/23/20 08/23/20  Yes Cyndi Bender, NP  metoprolol succinate (TOPROL-XL) 25 MG 24 hr tablet Take 0.5 tablets (12.5 mg total) by mouth  daily. 08/21/20  Yes Cyndi Bender, NP  Multiple Vitamins-Minerals (MULTIVITAMIN GUMMIES ADULT PO) Take by mouth daily.   Yes [provider]  neomycin-polymyxin-hydrocortisone (CORTISPORIN) 3.5-10000-1 OTIC suspension Place 3 drops into the left ear 3 (three) times daily for 7 days. 09/19/20 09/26/20 Yes Alain Deschene C, PA-C  pantoprazole (PROTONIX) 40 MG tablet Take 1 tablet (40 mg total) by mouth daily. 08/21/20 08/21/21 Yes Cyndi Bender, NP  Semaglutide 7 MG TABS Take 1 tablet by mouth daily. 08/01/20  Yes [provider]  XARELTO 20 MG TABS tablet Take 20 mg  by mouth daily. 06/11/20  Yes [provider]  nitroGLYCERIN (NITROSTAT) 0.4 MG SL tablet Place 1 tablet (0.4 mg total) under the tongue every 5 (five) minutes as needed for chest pain. 08/21/20 08/21/21  Cyndi Bender, NP  OVER THE COUNTER MEDICATION Take 2 tablets by mouth daily. Alpha King fat burning pill 30 mins before largest meal.    [provider]    Family History Family History  Problem Relation Age of Onset   Diabetes Father     Social History Social History   Tobacco Use   Smoking status: Every Day    Packs/day: 1.00    Types: Cigarettes   Smokeless tobacco: Never  Substance Use Topics   Alcohol use: Not Currently   Drug use: Never     Allergies   Heparin   Review of Systems Review of Systems  Constitutional:  Negative for activity change, appetite change, chills, fatigue and fever.  HENT:  Positive for ear pain. Negative for congestion, rhinorrhea, sinus pressure, sore throat and trouble swallowing.   Eyes:  Negative for discharge and redness.  Respiratory:  Negative for cough, chest tightness and shortness of breath.   Cardiovascular:  Negative for chest pain.  Gastrointestinal:  Negative for abdominal pain, diarrhea, nausea and vomiting.  Musculoskeletal:  Negative for myalgias.  Skin:  Negative for rash.  Neurological:  Negative for dizziness, light-headedness and headaches.    Physical Exam Triage Vital Signs ED Triage Vitals  Enc Vitals Group     BP 09/19/20 1447 137/81     Pulse Rate 09/19/20 1447 91     Resp 09/19/20 1447 14     Temp 09/19/20 1447 98.9 F (37.2 C)     Temp Source 09/19/20 1447 Oral     SpO2 09/19/20 1447 94 %     Weight --      Height --      Head Circumference --      Peak Flow --      Pain Score 09/19/20 1448 8     Pain Loc --      Pain Edu? --      Excl. in GC? --    No data found.  Updated Vital Signs BP 137/81 (BP Location: Left Arm)   Pulse 91   Temp 98.9 F (37.2 C) (Oral)   Resp 14   SpO2  94%   Visual Acuity Right Eye Distance:   Left Eye Distance:   Bilateral Distance:    Right Eye Near:   Left Eye Near:    Bilateral Near:     Physical Exam Vitals and nursing note reviewed.  Constitutional:      Appearance: He is well-developed.     Comments: No acute distress  HENT:     Head: Normocephalic and atraumatic.     Ears:     Comments: Left ear with tragus tenderness, EAC appears edematous erythematous with  discharge present, TM partially visualized and appears intact, slightly irregular and opaque looking without erythema    Nose: Nose normal.  Eyes:     Conjunctiva/sclera: Conjunctivae normal.  Cardiovascular:     Rate and Rhythm: Normal rate.  Pulmonary:     Effort: Pulmonary effort is normal. No respiratory distress.  Abdominal:     General: There is no distension.  Musculoskeletal:        General: Normal range of motion.     Cervical back: Neck supple.  Skin:    General: Skin is warm and dry.  Neurological:     Mental Status: He is alert and oriented to person, place, and time.     UC Treatments / Results  Labs (all labs ordered are listed, but only abnormal results are displayed) Labs Reviewed - No data to display  EKG   Radiology No results found.  Procedures Procedures (including critical care time)  Medications Ordered in UC Medications - No data to display  Initial Impression / Assessment and Plan / UC Course  I have reviewed the triage vital signs and the nursing notes.  Pertinent labs & imaging results that were available during my care of the patient were reviewed by me and considered in my medical decision making (see chart for details).    Treating for right otitis externa/possible otitis media with amoxicillin p.o. along with topical Cortisporin, Tylenol for pain, advised to keep ear clean and dry.  Monitor for gradual resolution of symptoms.Discussed strict return precautions. Patient verbalized understanding and is agreeable  with plan.  Final Clinical Impressions(s) / UC Diagnoses   Final diagnoses:  Infective otitis externa of left ear  Acute mucoid otitis media of left ear     Discharge Instructions      Begin amoxicillin twice daily for 1 week Cortisporin drops 3 times daily x1 week Tylenol as needed for pain   ED Prescriptions     Medication Sig Dispense Auth. Provider   amoxicillin (AMOXIL) 875 MG tablet Take 1 tablet (875 mg total) by mouth 2 (two) times daily for 7 days. 14 tablet Sander Remedios C, PA-C   neomycin-polymyxin-hydrocortisone (CORTISPORIN) 3.5-10000-1 OTIC suspension Place 3 drops into the left ear 3 (three) times daily for 7 days. 10 mL Braedyn Riggle, [place] C, PA-C      PDMP not reviewed this encounter.   Lew Dawes, [place] 09/19/20 [phone]

## 2020-09-19 NOTE — Medical Student Note (Addendum)
[facility] Provider Student Note For educational purposes for Medical, PA and NP students only and not part of the legal medical record.   CSN: [id number] Arrival date & time: 09/19/20  1323      History   Chief Complaint Chief Complaint  Patient presents with   Otalgia    HPI Greg Quinn is a 50 y.o. male.  Patient reports to the UC for complaint of left ear pain. Patient reports he slept on the ear last night and the pain has gotten significantly worse. Patient had a cotton ball in ear that he showed to this provider with yellow d/c on it. Patient tender to touch at [facility] but no obvious infection or abnormality. Denies sore throat, endorses headache, denies fever. Reports he recently had a heart attack and wanted to make sure the ear pain did not correlate.   The history is provided by the patient. No language interpreter was used.  Otalgia Location:  Left Behind ear:  No abnormality Quality:  Sore Severity:  Moderate Progression:  Worsening Chronicity:  New Relieved by:  Nothing Associated symptoms: ear discharge   Associated symptoms: no fever, no hearing loss and no sore throat    Past Medical History:  Diagnosis Date   Bilateral pulmonary embolism (HCC)    2009, on xarelto, IVC filter   Current every day smoker    25 pack year history   DVT, lower extremity (HCC)    2009   Hyperlipidemia LDL goal <70    Hypertension    NSTEMI (non-ST elevated myocardial infarction) (HCC) 08/20/2020   Uncontrolled diabetes mellitus (HCC)     Patient Active Problem List   Diagnosis Date Noted   NSTEMI (non-ST elevated myocardial infarction) (HCC) 08/20/2020   Uncontrolled diabetes mellitus (HCC) 08/20/2020   Hypertension 08/20/2020   Hyperlipidemia LDL goal <70 08/20/2020   DVT, lower extremity (HCC) 08/20/2020   Current every day smoker 08/20/2020   Bilateral pulmonary embolism (HCC) 08/20/2020   Unstable angina (HCC) 08/20/2020    Past  Surgical History:  Procedure Laterality Date   CORONARY STENT INTERVENTION N/A 08/20/2020   Procedure: CORONARY STENT INTERVENTION;  Surgeon: Corky Crafts, MD;  Location: [facility] INVASIVE CV LAB;  Service: Cardiovascular;  Laterality: N/A;   INTRAVASCULAR ULTRASOUND/IVUS N/A 08/20/2020   Procedure: Intravascular Ultrasound/IVUS;  Surgeon: Corky Crafts, MD;  Location: [facility] INVASIVE CV LAB;  Service: Cardiovascular;  Laterality: N/A;   LEFT HEART CATH AND CORONARY ANGIOGRAPHY N/A 08/20/2020   Procedure: LEFT HEART CATH AND CORONARY ANGIOGRAPHY;  Surgeon: Corky Crafts, MD;  Location: [facility] INVASIVE CV LAB;  Service: Cardiovascular;  Laterality: N/A;       Home Medications    Prior to Admission medications   Medication Sig Start Date End Date Taking? Authorizing Provider  amLODipine (NORVASC) 5 MG tablet Take 1 tablet (5 mg total) by mouth daily. 08/21/20   Cyndi Bender, NP  Ascorbic Acid (VITAMIN C) 1000 MG tablet Take 1,000 mg by mouth 2 (two) times daily.    [provider]  aspirin 81 MG EC tablet Take 1 tablet (81 mg total) by mouth daily. Swallow whole. 08/21/20   Cyndi Bender, NP  atorvastatin (LIPITOR) 80 MG tablet Take 1 tablet (80 mg total) by mouth daily. 08/21/20   Cyndi Bender, NP  clopidogrel (PLAVIX) 75 MG tablet Take 1 tablet (75 mg total) by mouth daily. 08/22/20   Cyndi Bender, NP  glipiZIDE (GLUCOTROL) 10 MG tablet Take 0.5 tablets (5 mg  total) by mouth daily before breakfast. 08/21/20   Cyndi Bender, NP  lisinopril (ZESTRIL) 20 MG tablet Take 1 tablet (20 mg total) by mouth daily. 08/21/20   Cyndi Bender, NP  metFORMIN (GLUCOPHAGE) 1000 MG tablet Take 1 tablet (1,000 mg total) by mouth 2 (two) times daily. Start on 08/23/20 08/23/20   Cyndi Bender, NP  metoprolol succinate (TOPROL-XL) 25 MG 24 hr tablet Take 0.5 tablets (12.5 mg total) by mouth daily. 08/21/20   Cyndi Bender, NP  Multiple Vitamins-Minerals (MULTIVITAMIN GUMMIES ADULT PO) Take by mouth daily.    [provider]  nitroGLYCERIN (NITROSTAT) 0.4 MG SL tablet Place 1 tablet (0.4 mg total) under the tongue every 5 (five) minutes as needed for chest pain. 08/21/20 08/21/21  Cyndi Bender, NP  OVER THE COUNTER MEDICATION Take 2 tablets by mouth daily. Alpha King fat burning pill 30 mins before largest meal.    [provider]  pantoprazole (PROTONIX) 40 MG tablet Take 1 tablet (40 mg total) by mouth daily. 08/21/20 08/21/21  Cyndi Bender, NP  Semaglutide 7 MG TABS Take 1 tablet by mouth daily. 08/01/20   [provider]  XARELTO 20 MG TABS tablet Take 20 mg by mouth daily. 06/11/20   [provider]    Family History Family History  Problem Relation Age of Onset   Diabetes Father     Social History Social History   Tobacco Use   Smoking status: Every Day    Packs/day: 1.00    Types: Cigarettes   Smokeless tobacco: Never  Substance Use Topics   Alcohol use: Not Currently   Drug use: Never     Allergies   Heparin   Review of Systems Review of Systems  Constitutional:  Negative for fever.  HENT:  Positive for ear discharge and ear pain. Negative for facial swelling, hearing loss, sore throat, trouble swallowing and voice change.   Respiratory:  Negative for shortness of breath.   Cardiovascular:  Negative for chest pain, palpitations and leg swelling.  Neurological:  Negative for dizziness and weakness.  Psychiatric/Behavioral:  Negative for agitation, behavioral problems and confusion.     Physical Exam Updated Vital Signs BP 137/81 (BP Location: Left Arm)   Pulse 91   Temp 98.9 F (37.2 C) (Oral)   Resp 14   SpO2 94%   Physical Exam Constitutional:      General: He is not in acute distress.    Appearance: Normal appearance. He is normal weight. He is not ill-appearing, toxic-appearing or diaphoretic.  HENT:     Right Ear: Hearing and external ear normal. No drainage, swelling or tenderness. There is no impacted cerumen. No foreign body.     Left  Ear: Hearing normal. No decreased hearing noted. Drainage and tenderness present. No laceration. No foreign body.  Skin:    General: Skin is warm and dry.     Coloration: Skin is not pale.     Findings: No erythema or rash.  Neurological:     Mental Status: He is alert.  Psychiatric:        Behavior: Behavior is cooperative.    Initial Impression / Assessment and Plan / ED Course  Concern for middle ear infection in left ear. Patient to take Amoxicillin x2 daily for a week and C-sporin drops for ear x3 daily.  Patient encouraged to take tylenol for pain and try not to sleep on left side.  Patient encouraged to follow up if not improving. For any new  chest pain, severe neck or head pain seek re-evaluation. Patient reports understanding and agrees with plan of care.

## 2020-09-25 ENCOUNTER — Telehealth: Payer: Self-pay | Admitting: Cardiovascular Disease

## 2020-09-25 NOTE — Telephone Encounter (Signed)
[facility] received short term disability forms from [employer]. Forms were put in Dr.Croitoru box on 9/1.

## 2020-09-26 NOTE — Telephone Encounter (Signed)
Message routed to [facility] RN

## 2020-09-26 NOTE — Telephone Encounter (Signed)
Attempted to reach the patient. Was unable to get through.   According to Dr. Royann Shivers, the FMLA has been completed.

## 2020-09-26 NOTE — Telephone Encounter (Signed)
Pt is returning call in regards to disability forms. Pt can be reached at [phone]

## 2020-09-30 NOTE — Telephone Encounter (Signed)
Attempted to call patient, phone number not in service. Patient will need to come into [facility] office to sign release, billing form, and pay $29 forms fee.

## 2020-09-30 NOTE — Telephone Encounter (Signed)
Spoke with patient regarding signatures, and forms payment needed to move on with the process. Patient stated he will try to stop by tomorrow if possible.

## 2020-10-10 ENCOUNTER — Telehealth: Payer: Self-pay | Admitting: Cardiovascular Disease

## 2020-10-10 NOTE — Telephone Encounter (Signed)
[employer] faxed forms over requesting additional information. Forms were given to Dr.Croitoru nurse on 9/16.

## 2020-10-14 ENCOUNTER — Telehealth: Payer: Self-pay | Admitting: *Deleted

## 2020-10-14 DIAGNOSIS — Z1211 Encounter for screening for malignant neoplasm of colon: Secondary | ICD-10-CM | POA: Diagnosis not present

## 2020-10-14 DIAGNOSIS — Z1212 Encounter for screening for malignant neoplasm of rectum: Secondary | ICD-10-CM | POA: Diagnosis not present

## 2020-10-14 NOTE — Telephone Encounter (Signed)
Left a message for the patient to call back concerning his short term disability forms. He has not been seen in the office yet for post procedure/hospitalization. He has an appointment on 9/26 with Gillian Shields, NP.

## 2020-10-17 NOTE — Telephone Encounter (Signed)
Left message for the patient to call back. He moved his appointment to 11/14/20. The forms cannot be filled out until the patient calls back and is seen.

## 2020-10-18 LAB — COLOGUARD: COLOGUARD: POSITIVE — AB

## 2020-10-20 ENCOUNTER — Ambulatory Visit (HOSPITAL_BASED_OUTPATIENT_CLINIC_OR_DEPARTMENT_OTHER): Payer: BC Managed Care – PPO | Admitting: Family

## 2020-10-30 DIAGNOSIS — E119 Type 2 diabetes mellitus without complications: Secondary | ICD-10-CM | POA: Diagnosis not present

## 2020-10-30 DIAGNOSIS — I10 Essential (primary) hypertension: Secondary | ICD-10-CM | POA: Diagnosis not present

## 2020-10-30 DIAGNOSIS — G5602 Carpal tunnel syndrome, left upper limb: Secondary | ICD-10-CM | POA: Diagnosis not present

## 2020-11-09 ENCOUNTER — Other Ambulatory Visit: Payer: Self-pay

## 2020-11-09 ENCOUNTER — Emergency Department (HOSPITAL_BASED_OUTPATIENT_CLINIC_OR_DEPARTMENT_OTHER)
Admission: EM | Admit: 2020-11-09 | Discharge: 2020-11-10 | Disposition: A | Discharge status: Home / Self Care | Payer: BC Managed Care – PPO | Attending: Emergency Medicine | Admitting: Emergency Medicine

## 2020-11-09 ENCOUNTER — Encounter (HOSPITAL_BASED_OUTPATIENT_CLINIC_OR_DEPARTMENT_OTHER): Payer: Self-pay | Admitting: *Deleted

## 2020-11-09 ENCOUNTER — Emergency Department (HOSPITAL_BASED_OUTPATIENT_CLINIC_OR_DEPARTMENT_OTHER): Payer: BC Managed Care – PPO

## 2020-11-09 DIAGNOSIS — I2510 Atherosclerotic heart disease of native coronary artery without angina pectoris: Secondary | ICD-10-CM | POA: Diagnosis not present

## 2020-11-09 DIAGNOSIS — R519 Headache, unspecified: Secondary | ICD-10-CM | POA: Insufficient documentation

## 2020-11-09 DIAGNOSIS — K8500 Idiopathic acute pancreatitis without necrosis or infection: Secondary | ICD-10-CM | POA: Diagnosis not present

## 2020-11-09 DIAGNOSIS — M47814 Spondylosis without myelopathy or radiculopathy, thoracic region: Secondary | ICD-10-CM | POA: Diagnosis not present

## 2020-11-09 DIAGNOSIS — R0789 Other chest pain: Secondary | ICD-10-CM | POA: Diagnosis not present

## 2020-11-09 DIAGNOSIS — Z79899 Other long term (current) drug therapy: Secondary | ICD-10-CM | POA: Insufficient documentation

## 2020-11-09 DIAGNOSIS — I119 Hypertensive heart disease without heart failure: Secondary | ICD-10-CM | POA: Insufficient documentation

## 2020-11-09 DIAGNOSIS — Z7984 Long term (current) use of oral hypoglycemic drugs: Secondary | ICD-10-CM | POA: Diagnosis not present

## 2020-11-09 DIAGNOSIS — F17210 Nicotine dependence, cigarettes, uncomplicated: Secondary | ICD-10-CM | POA: Diagnosis not present

## 2020-11-09 DIAGNOSIS — I700 Atherosclerosis of aorta: Secondary | ICD-10-CM | POA: Diagnosis not present

## 2020-11-09 DIAGNOSIS — R079 Chest pain, unspecified: Secondary | ICD-10-CM | POA: Diagnosis not present

## 2020-11-09 DIAGNOSIS — K859 Acute pancreatitis without necrosis or infection, unspecified: Secondary | ICD-10-CM | POA: Diagnosis not present

## 2020-11-09 DIAGNOSIS — Z7982 Long term (current) use of aspirin: Secondary | ICD-10-CM | POA: Insufficient documentation

## 2020-11-09 HISTORY — DX: Atherosclerotic heart disease of native coronary artery without angina pectoris: I25.10

## 2020-11-09 LAB — CBC WITH DIFFERENTIAL/PLATELET
Abs Immature Granulocytes: 0.03 10*3/uL (ref 0.00–0.07)
Basophils Absolute: 0 10*3/uL (ref 0.0–0.1)
Basophils Relative: 0 %
Eosinophils Absolute: 0.1 10*3/uL (ref 0.0–0.5)
Eosinophils Relative: 1 %
HCT: 39.8 % (ref 39.0–52.0)
Hemoglobin: 13.7 g/dL (ref 13.0–17.0)
Immature Granulocytes: 0 %
Lymphocytes Relative: 25 %
Lymphs Abs: 2.4 10*3/uL (ref 0.7–4.0)
MCH: 29.6 pg (ref 26.0–34.0)
MCHC: 34.4 g/dL (ref 30.0–36.0)
MCV: 86 fL (ref 80.0–100.0)
Monocytes Absolute: 0.9 10*3/uL (ref 0.1–1.0)
Monocytes Relative: 9 %
Neutro Abs: 6.3 10*3/uL (ref 1.7–7.7)
Neutrophils Relative %: 65 %
Platelets: 254 10*3/uL (ref 150–400)
RBC: 4.63 MIL/uL (ref 4.22–5.81)
RDW: 12.5 % (ref 11.5–15.5)
WBC: 9.7 10*3/uL (ref 4.0–10.5)
nRBC: 0 % (ref 0.0–0.2)

## 2020-11-09 MED ORDER — ONDANSETRON HCL 4 MG/2ML IJ SOLN
4.0000 mg | Freq: Once | INTRAMUSCULAR | Status: AC
  Administered 2020-11-09: 4 mg via INTRAVENOUS
  Filled 2020-11-09: qty 2

## 2020-11-09 MED ORDER — FENTANYL CITRATE PF 50 MCG/ML IJ SOSY
100.0000 ug | PREFILLED_SYRINGE | Freq: Once | INTRAMUSCULAR | Status: AC
  Administered 2020-11-09: 100 ug via INTRAVENOUS
  Filled 2020-11-09: qty 2

## 2020-11-09 NOTE — ED Provider Notes (Signed)
[facility] Provider Note: Greg Dell, MD, FACEP  CSN: [id number] MRN: [id number] ARRIVAL: 11/09/20 at 2311 ROOM: MH12/MH12   CHIEF COMPLAINT  Chest Pain   HISTORY OF PRESENT ILLNESS  11/09/20 11:34 PM Greg Quinn is a 50 y.o. male with a history of coronary artery disease and thromboembolic disease who is on Xarelto.  He had a stent placed in July of this year for 90% blockage of his mid circumflex artery.  He is no longer on aspirin but has been continued on Plavix.  About 3:30 PM he developed a headache, different from his usual "migraines".  The headache was located in his occipital region and was worse with movement of the head.  He had no associated nausea or photophobia.  Soon after the onset of the headache he decided to make himself something to eat.  While in the process he developed chest pain.  The chest pain is located in his xiphoid region and radiates across his chest to the left and right.  He rates it as a 10 out of 10 and describes it as feeling like heartburn or pressure.  It is similar to previous cardiac chest pain.  It is also reproducible with pressure on the xiphoid.  He has had no associated nausea, vomiting, shortness of breath or diaphoresis.  He has taken a total of 5 nitroglycerin tablets since onset without relief of the pain.   Past Medical History:  Diagnosis Date   Bilateral pulmonary embolism (HCC)    2009, on xarelto, IVC filter   Coronary artery disease    Current every day smoker    25 pack year history   DVT, lower extremity (HCC)    2009   Hyperlipidemia LDL goal <70    Hypertension    NSTEMI (non-ST elevated myocardial infarction) (HCC) 08/20/2020   Uncontrolled diabetes mellitus     Past Surgical History:  Procedure Laterality Date   CORONARY STENT INTERVENTION N/A 08/20/2020   Procedure: CORONARY STENT INTERVENTION;  Surgeon: Corky Crafts, MD;  Location: [facility] INVASIVE CV LAB;  Service: Cardiovascular;   Laterality: N/A;   INTRAVASCULAR ULTRASOUND/IVUS N/A 08/20/2020   Procedure: Intravascular Ultrasound/IVUS;  Surgeon: Corky Crafts, MD;  Location: [facility] INVASIVE CV LAB;  Service: Cardiovascular;  Laterality: N/A;   LEFT HEART CATH AND CORONARY ANGIOGRAPHY N/A 08/20/2020   Procedure: LEFT HEART CATH AND CORONARY ANGIOGRAPHY;  Surgeon: Corky Crafts, MD;  Location: [facility] INVASIVE CV LAB;  Service: Cardiovascular;  Laterality: N/A;    Family History  Problem Relation Age of Onset   Diabetes Father     Social History   Tobacco Use   Smoking status: Every Day    Packs/day: 1.00    Types: Cigarettes   Smokeless tobacco: Never  Substance Use Topics   Alcohol use: Yes    Comment: occasional   Drug use: Never    Prior to Admission medications   Medication Sig Start Date End Date Taking? Authorizing Provider  amLODipine (NORVASC) 5 MG tablet Take 1 tablet (5 mg total) by mouth daily. 08/21/20   Cyndi Bender, NP  Ascorbic Acid (VITAMIN C) 1000 MG tablet Take 1,000 mg by mouth 2 (two) times daily.    [provider]  aspirin 81 MG EC tablet Take 1 tablet (81 mg total) by mouth daily. Swallow whole. 08/21/20   Cyndi Bender, NP  atorvastatin (LIPITOR) 80 MG tablet Take 1 tablet (80 mg total) by mouth daily. 08/21/20   Cyndi Bender, NP  clopidogrel (PLAVIX) 75 MG tablet Take 1 tablet (75 mg total) by mouth daily. 08/22/20   Cyndi Bender, NP  glipiZIDE (GLUCOTROL) 10 MG tablet Take 0.5 tablets (5 mg total) by mouth daily before breakfast. 08/21/20   Cyndi Bender, NP  lisinopril (ZESTRIL) 20 MG tablet Take 1 tablet (20 mg total) by mouth daily. 08/21/20   Cyndi Bender, NP  metFORMIN (GLUCOPHAGE) 1000 MG tablet Take 1 tablet (1,000 mg total) by mouth 2 (two) times daily. Start on 08/23/20 08/23/20   Cyndi Bender, NP  metoprolol succinate (TOPROL-XL) 25 MG 24 hr tablet Take 0.5 tablets (12.5 mg total) by mouth daily. 08/21/20   Cyndi Bender, NP  Multiple Vitamins-Minerals (MULTIVITAMIN GUMMIES ADULT PO)  Take by mouth daily.    [provider]  nitroGLYCERIN (NITROSTAT) 0.4 MG SL tablet Place 1 tablet (0.4 mg total) under the tongue every 5 (five) minutes as needed for chest pain. 08/21/20 08/21/21  Cyndi Bender, NP  OVER THE COUNTER MEDICATION Take 2 tablets by mouth daily. Alpha King fat burning pill 30 mins before largest meal.    [provider]  pantoprazole (PROTONIX) 40 MG tablet Take 1 tablet (40 mg total) by mouth daily. 08/21/20 08/21/21  Cyndi Bender, NP  Semaglutide 7 MG TABS Take 1 tablet by mouth daily. 08/01/20   [provider]  XARELTO 20 MG TABS tablet Take 20 mg by mouth daily. 06/11/20   [provider]    Allergies Heparin   REVIEW OF SYSTEMS  Negative except as noted here or in the History of Present Illness.   PHYSICAL EXAMINATION  Initial Vital Signs Blood pressure (!) 148/88, pulse 88, temperature 98.3 F (36.8 C), temperature source Oral, resp. rate 16, height 5' 10.5" (1.791 m), weight 123.8 kg, SpO2 98 %.  Examination General: Well-developed, well-nourished male in no acute distress; appearance consistent with age of record HENT: normocephalic; atraumatic Eyes: pupils equal, round and reactive to light; extraocular muscles intact Neck: supple Heart: regular rate and rhythm; no murmur Lungs: clear to auscultation bilaterally Chest: Xiphoid tenderness Abdomen: soft; nondistended; epigastric tenderness; bowel sounds present Extremities: No deformity; full range of motion; pulses normal Neurologic: Awake, alert and oriented; motor function intact in all extremities and symmetric; no facial droop Skin: Warm and dry; chronic appearing hyperpigmentation of left lower leg Psychiatric: Normal mood and affect   RESULTS  Summary of this visit's results, reviewed and interpreted by myself:   EKG Interpretation  Date/Time:  Sunday November 09 2020 23:20:03 EDT Ventricular Rate:  90 PR Interval:  158 QRS Duration: 92 QT  Interval:  348 QTC Calculation: 425 R Axis:   -33 Text Interpretation: Normal sinus rhythm Left axis deviation Septal infarct , age undetermined Abnormal ECG No significant change was found Confirmed by Paula Libra ([id number]) on 11/09/2020 11:33:35 PM       Laboratory Studies: Results for orders placed or performed during the hospital encounter of 11/09/20 (from the past 24 hour(s))  CBC with Differential/Platelet     Status: None   Collection Time: 11/09/20 11:49 PM  Result Value Ref Range   WBC 9.7 4.0 - 10.5 K/uL   RBC 4.63 4.22 - 5.81 MIL/uL   Hemoglobin 13.7 13.0 - 17.0 g/dL   HCT 41.3 24.4 - 01.0 %   MCV 86.0 80.0 - 100.0 fL   MCH 29.6 26.0 - 34.0 pg   MCHC 34.4 30.0 - 36.0 g/dL   RDW 27.2 53.6 - 64.4 %   Platelets 254 150 - 400  K/uL   nRBC 0.0 0.0 - 0.2 %   Neutrophils Relative % 65 %   Neutro Abs 6.3 1.7 - 7.7 K/uL   Lymphocytes Relative 25 %   Lymphs Abs 2.4 0.7 - 4.0 K/uL   Monocytes Relative 9 %   Monocytes Absolute 0.9 0.1 - 1.0 K/uL   Eosinophils Relative 1 %   Eosinophils Absolute 0.1 0.0 - 0.5 K/uL   Basophils Relative 0 %   Basophils Absolute 0.0 0.0 - 0.1 K/uL   Immature Granulocytes 0 %   Abs Immature Granulocytes 0.03 0.00 - 0.07 K/uL  Lipase, blood     Status: Abnormal   Collection Time: 11/09/20 11:49 PM  Result Value Ref Range   Lipase 117 (H) 11 - 51 U/L  Comprehensive metabolic panel     Status: Abnormal   Collection Time: 11/09/20 11:49 PM  Result Value Ref Range   Sodium 134 (L) 135 - 145 mmol/L   Potassium 3.6 3.5 - 5.1 mmol/L   Chloride 103 98 - 111 mmol/L   CO2 25 22 - 32 mmol/L   Glucose, Bld 187 (H) 70 - 99 mg/dL   BUN 8 6 - 20 mg/dL   Creatinine, Ser 3.26 0.61 - 1.24 mg/dL   Calcium 8.7 (L) 8.9 - 10.3 mg/dL   Total Protein 7.2 6.5 - 8.1 g/dL   Albumin 3.8 3.5 - 5.0 g/dL   AST 21 15 - 41 U/L   ALT 20 0 - 44 U/L   Alkaline Phosphatase 100 38 - 126 U/L   Total Bilirubin 0.5 0.3 - 1.2 mg/dL   GFR, Estimated >71 >24 mL/min   Anion gap  6 5 - 15  Troponin I (High Sensitivity)     Status: None   Collection Time: 11/09/20 11:49 PM  Result Value Ref Range   Troponin I (High Sensitivity) 7 <18 ng/L  Troponin I (High Sensitivity)     Status: None   Collection Time: 11/10/20  1:55 AM  Result Value Ref Range   Troponin I (High Sensitivity) 6 <18 ng/L   Imaging Studies: CT ABDOMEN PELVIS W CONTRAST  Result Date: 11/10/2020 CLINICAL DATA:  Pancreatitis EXAM: CT ABDOMEN AND PELVIS WITH CONTRAST TECHNIQUE: Multidetector CT imaging of the abdomen and pelvis was performed using the standard protocol following bolus administration of intravenous contrast. CONTRAST:  OMNIPAQUE IOHEXOL 300 MG/ML  SOLN COMPARISON:  None. FINDINGS: Lower chest: Lung bases are clear. Hepatobiliary: Liver is within normal limits. Gallbladder is unremarkable. No hepatic or extrahepatic ductal dilatation. Pancreas: Mild peripancreatic inflammatory changes along the pancreaticoduodenal groove (series 2/image 44), suggesting mild acute pancreatitis. No pancreatic ductal dilatation. No drainable fluid collection/pseudocyst. Spleen: Within normal limits. Adrenals/Urinary Tract: Adrenal glands are within normal limits. Kidneys are within normal limits.  No hydronephrosis. Bladder is within normal limits. Stomach/Bowel: Stomach is within normal limits. No evidence of bowel obstruction. Normal appendix (series 2/image 87). No colonic wall thickening or inflammatory changes. Vascular/Lymphatic: No evidence of abdominal aortic aneurysm. Atherosclerotic calcifications of the abdominal aorta and branch vessels. IVC filter. No suspicious abdominopelvic lymphadenopathy. Reproductive: Prostate is unremarkable. Other: No abdominopelvic ascites. Musculoskeletal: Degenerative changes of the lower thoracic spine. IMPRESSION: Mild peripancreatic inflammatory changes along the pancreaticoduodenal groove in this patient with suspected acute pancreatitis. No evidence of complication.  Electronically Signed   By: Charline Bills M.D.   On: 11/10/2020 01:06   DG Chest Port 1 View  Result Date: 11/10/2020 CLINICAL DATA:  Chest pain EXAM: PORTABLE CHEST 1 VIEW COMPARISON:  08/20/20 FINDINGS: Cardiac shadow is within normal limits. Lungs are clear bilaterally. No focal infiltrate or sizable effusion is seen. No bony abnormality is noted. IMPRESSION: No active disease. Electronically Signed   By: Alcide Clever M.D.   On: 11/10/2020 00:15    ED COURSE and MDM  Nursing notes, initial and subsequent vitals signs, including pulse oximetry, reviewed and interpreted by myself.  Vitals:   11/10/20 0120 11/10/20 0130 11/10/20 0200 11/10/20 0230  BP:  (!) 133/94 (!) 137/95 127/88  Pulse: 80 80 81 84  Resp: 18 19 20 16   Temp:      TempSrc:      SpO2: 100% 96% 90% 96%  Weight:      Height:       Medications  ondansetron (ZOFRAN) injection 4 mg (4 mg Intravenous Given 11/09/20 2355)  fentaNYL (SUBLIMAZE) injection 100 mcg (100 mcg Intravenous Given 11/09/20 2356)  iohexol (OMNIPAQUE) 300 MG/ML solution 100 mL (100 mLs Intravenous Contrast Given 11/10/20 0100)  fentaNYL (SUBLIMAZE) injection 100 mcg (100 mcg Intravenous Given 11/10/20 0151)   12:28 AM EKG unchanged from prior.  Troponin within normal limits.  Lipase elevated and patient does have epigastric tenderness.  We will obtain a CT of the abdomen to evaluate for pancreatitis.   3:13 AM The patient has had 2 normal troponins and an EKG that is not significantly changed from previous study.  His chest/abdominal pain is reproducible and is consistent with the elevated lipase and pancreatic inflammation seen on CT scan.  I do not believe he needs hospitalization for this at this time as it appears to be mild but he was advised to return if symptoms are worsening.  He plans to follow-up with his primary care physician.  PROCEDURES  Procedures   ED DIAGNOSES     ICD-10-CM   1. Idiopathic acute pancreatitis without infection  or necrosis  K85.00          Bonny Egger, 11/12/20, MD 11/10/20 [phone]

## 2020-11-09 NOTE — ED Triage Notes (Signed)
Pt reports mha today followed by chest pain. States he took 5 NTG sl without relief. Reports he had stent placement in July for 90% blockage.

## 2020-11-09 NOTE — ED Notes (Signed)
ED Provider at bedside. 

## 2020-11-10 ENCOUNTER — Emergency Department (HOSPITAL_BASED_OUTPATIENT_CLINIC_OR_DEPARTMENT_OTHER): Payer: BC Managed Care – PPO

## 2020-11-10 DIAGNOSIS — K8500 Idiopathic acute pancreatitis without necrosis or infection: Secondary | ICD-10-CM | POA: Diagnosis not present

## 2020-11-10 DIAGNOSIS — I700 Atherosclerosis of aorta: Secondary | ICD-10-CM | POA: Diagnosis not present

## 2020-11-10 DIAGNOSIS — M47814 Spondylosis without myelopathy or radiculopathy, thoracic region: Secondary | ICD-10-CM | POA: Diagnosis not present

## 2020-11-10 DIAGNOSIS — R0789 Other chest pain: Secondary | ICD-10-CM | POA: Diagnosis not present

## 2020-11-10 DIAGNOSIS — R079 Chest pain, unspecified: Secondary | ICD-10-CM | POA: Diagnosis not present

## 2020-11-10 DIAGNOSIS — K859 Acute pancreatitis without necrosis or infection, unspecified: Secondary | ICD-10-CM | POA: Diagnosis not present

## 2020-11-10 LAB — COMPREHENSIVE METABOLIC PANEL
ALT: 20 U/L (ref 0–44)
AST: 21 U/L (ref 15–41)
Albumin: 3.8 g/dL (ref 3.5–5.0)
Alkaline Phosphatase: 100 U/L (ref 38–126)
Anion gap: 6 (ref 5–15)
BUN: 8 mg/dL (ref 6–20)
CO2: 25 mmol/L (ref 22–32)
Calcium: 8.7 mg/dL — ABNORMAL LOW (ref 8.9–10.3)
Chloride: 103 mmol/L (ref 98–111)
Creatinine, Ser: 0.73 mg/dL (ref 0.61–1.24)
GFR, Estimated: >60 mL/min (ref >60)
Glucose, Bld: 187 mg/dL — ABNORMAL HIGH (ref 70–99)
Potassium: 3.6 mmol/L (ref 3.5–5.1)
Sodium: 134 mmol/L — ABNORMAL LOW (ref 135–145)
Total Bilirubin: 0.5 mg/dL (ref 0.3–1.2)
Total Protein: 7.2 g/dL (ref 6.5–8.1)

## 2020-11-10 LAB — TROPONIN I (HIGH SENSITIVITY)
Troponin I (High Sensitivity): 6 ng/L (ref <18)
Troponin I (High Sensitivity): 7 ng/L (ref <18)

## 2020-11-10 LAB — LIPASE, BLOOD: Lipase: 117 U/L — ABNORMAL HIGH (ref 11–51)

## 2020-11-10 MED ORDER — OXYCODONE HCL 5 MG PO CAPS: 5.0000 mg | ORAL_CAPSULE | ORAL | 0 refills | Status: DC | PRN

## 2020-11-10 MED ORDER — FENTANYL CITRATE PF 50 MCG/ML IJ SOSY
100.0000 ug | PREFILLED_SYRINGE | Freq: Once | INTRAMUSCULAR | Status: AC
  Administered 2020-11-10: 100 ug via INTRAVENOUS
  Filled 2020-11-10: qty 2

## 2020-11-10 MED ORDER — ONDANSETRON 8 MG PO TBDP: 8.0000 mg | ORAL_TABLET | Freq: Three times a day (TID) | ORAL | 0 refills | Status: DC | PRN

## 2020-11-10 MED ORDER — IOHEXOL 300 MG/ML  SOLN
100.0000 mL | Freq: Once | INTRAMUSCULAR | Status: AC | PRN
  Administered 2020-11-10: 100 mL via INTRAVENOUS

## 2020-11-10 NOTE — ED Notes (Signed)
Patient verbalizes understanding of discharge instructions. Opportunity for questioning and answers were provided. Armband removed by staff, pt discharged from ED. Ambulated out to lobby  

## 2020-11-10 NOTE — ED Notes (Signed)
Patient transported to CT 

## 2020-11-14 ENCOUNTER — Ambulatory Visit (HOSPITAL_BASED_OUTPATIENT_CLINIC_OR_DEPARTMENT_OTHER): Payer: BC Managed Care – PPO | Admitting: Family

## 2020-11-14 NOTE — Progress Notes (Deleted)
Office Visit    Patient Name: Greg Quinn Date of Encounter: 11/14/2020  PCP:  Patient, No Pcp Per (Inactive)    [facility]  Cardiologist:  Thurmon Fair, MD  Advanced Practice Provider:  No care team member to display Electrophysiologist:  None    Chief Complaint    Greg Quinn is a 50 y.o. 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 presents today for follow up of CAD   Past Medical History    Past Medical History:  Diagnosis Date   Bilateral pulmonary embolism (HCC)    2009, on xarelto, IVC filter   Coronary artery disease    Current every day smoker    25 pack year history   DVT, lower extremity (HCC)    2009   Hyperlipidemia LDL goal <70    Hypertension    NSTEMI (non-ST elevated myocardial infarction) (HCC) 08/20/2020   Uncontrolled diabetes mellitus    Past Surgical History:  Procedure Laterality Date   CORONARY STENT INTERVENTION N/A 08/20/2020   Procedure: CORONARY STENT INTERVENTION;  Surgeon: Corky Crafts, MD;  Location: [facility] INVASIVE CV LAB;  Service: Cardiovascular;  Laterality: N/A;   INTRAVASCULAR ULTRASOUND/IVUS N/A 08/20/2020   Procedure: Intravascular Ultrasound/IVUS;  Surgeon: Corky Crafts, MD;  Location: [facility] INVASIVE CV LAB;  Service: Cardiovascular;  Laterality: N/A;   LEFT HEART CATH AND CORONARY ANGIOGRAPHY N/A 08/20/2020   Procedure: LEFT HEART CATH AND CORONARY ANGIOGRAPHY;  Surgeon: Corky Crafts, MD;  Location: [facility] INVASIVE CV LAB;  Service: Cardiovascular;  Laterality: N/A;    Allergies  Allergies  Allergen Reactions   Heparin Other (See Comments)    History of Present Illness    Greg Quinn is a 50 y.o. 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  Prior stress test in 2017 in [place]  normal per patient report. 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   Admitted 07/2020 with chest pain and underwent [facility] 08/20/20 with 90% stenosis of Cx treated with DES. Echo with normal LVEF. LDL 93 and LIpitor increased to 80mg  QD.  He was discharged on Xarelto and Plavix.   ED visit 11/09/20 with elevated lipase and pancreatic inflammation on CT scan.   He presents today for follow up. ***   EKGs/Labs/Other Studies Reviewed:   The following studies were reviewed today:    LHC on 08/20/20:     Mid Cx lesion is 90% stenosed.   Dist RCA lesion is 40% stenosed.   Prox LAD to Mid LAD lesion is 25% stenosed.   A drug-eluting stent was successfully placed using a STENT ONYX FRONTIER 4.0X26, postdilated to 4.5 mm.   Post intervention, there is a 0% residual stenosis.   The left ventricular systolic function is normal.   LV end diastolic pressure is mildly elevated.   The left ventricular ejection fraction is 55-65% by visual estimate.   There is no aortic valve stenosis.   Recommend to resume Rivaroxaban, at currently prescribed dose and frequency on 08/21/2020. Recommend concurrent antiplatelet therapy of Aspirin 81 mg for 1 month and Clopidogrel 75mg  daily for 12 months.     Loading dose of clopidogrel ordered for tomorrow.  Brilinta was given at the time of PCI, but will need to be changed to clopidogrel due  to Xarelto.     Aggressive secondary prevention including smoking cessation.     Diagnostic Dominance: Right     Intervention     _____________   Echo from 08/20/20:    1. Left ventricular ejection fraction, by estimation, is 60 to 65%. The  left ventricle has normal function. The left ventricle has no regional  wall motion abnormalities. There is mild left ventricular hypertrophy.  Left ventricular diastolic parameters were normal.   2. Right ventricular systolic function is normal. The right ventricular  size is normal. Tricuspid regurgitation signal is inadequate for assessing  PA  pressure.   3. The mitral valve is normal in structure. Trivial mitral valve  regurgitation. No evidence of mitral stenosis.   4. The aortic valve is tricuspid. Aortic valve regurgitation is not  visualized. No aortic stenosis is present.   5. The inferior vena cava is dilated in size with >50% respiratory  variability, suggesting right atrial pressure of 8 mmHg.    EKG: No EKG today  Recent Labs: 11/09/2020: ALT 20; BUN 8; Creatinine, Ser 0.73; Hemoglobin 13.7; Platelets 254; Potassium 3.6; Sodium 134  Recent Lipid Panel    Component Value Date/Time   CHOL 146 08/21/2020 0207   TRIG 57 08/21/2020 0207   HDL 42 08/21/2020 0207   CHOLHDL 3.5 08/21/2020 0207   VLDL 11 08/21/2020 0207   LDLCALC 93 08/21/2020 0207    Risk Assessment/Calculations:  {Does this patient have ATRIAL FIBRILLATION?[phone]}  Home Medications   No outpatient medications have been marked as taking for the 11/14/20 encounter (Appointment) with Alver Sorrow, NP.     Review of Systems   ***   All other systems reviewed and are otherwise negative except as noted above.  Physical Exam    VS:  There were no vitals taken for this visit. , BMI There is no height or weight on file to calculate BMI.  Wt Readings from Last 3 Encounters:  11/09/20 273 lb (123.8 kg)  08/21/20 271 lb 1.6 oz (123 kg)  01/07/20 270 lb (122.5 kg)    GEN: Well nourished, well developed, in no acute distress. HEENT: normal. Neck: Supple, no JVD, carotid bruits, or masses. Cardiac: ***RRR, no murmurs, rubs, or gallops. No clubbing, cyanosis, edema.  ***Radials/PT 2+ and equal bilaterally.  Respiratory:  ***Respirations regular and unlabored, clear to auscultation bilaterally. GI: Soft, nontender, nondistended. MS: No deformity or atrophy. Skin: Warm and dry, no rash. Neuro:  Strength and sensation are intact. Psych: Normal affect.  Assessment & Plan    CAD s/p DES to Cx -   HLD, LDL goal <70 -   DM2 - 08/20/20 A1c  8.8%. ***  OSA -   Disposition: Follow up {follow up:15908} with Dr. Royann Shivers or APP.  Signed, Alver Sorrow, NP 11/14/2020, 8:03 AM [facility]

## 2020-11-18 NOTE — Telephone Encounter (Signed)
Patient no showed for appt, provider unable to complete forms.

## 2020-11-18 NOTE — Telephone Encounter (Signed)
Patient no showed to his 10/21 appointment. FMLA papers have been filed. Unable to complete.

## 2020-12-26 ENCOUNTER — Emergency Department (HOSPITAL_COMMUNITY): Payer: BC Managed Care – PPO

## 2020-12-26 ENCOUNTER — Emergency Department (HOSPITAL_COMMUNITY)
Admission: EM | Admit: 2020-12-26 | Discharge: 2020-12-26 | Disposition: A | Discharge status: Home / Self Care | Payer: BC Managed Care – PPO | Attending: Emergency Medicine | Admitting: Emergency Medicine

## 2020-12-26 ENCOUNTER — Encounter (HOSPITAL_COMMUNITY): Payer: Self-pay | Admitting: Emergency Medicine

## 2020-12-26 ENCOUNTER — Other Ambulatory Visit: Payer: Self-pay

## 2020-12-26 DIAGNOSIS — Z20822 Contact with and (suspected) exposure to covid-19: Secondary | ICD-10-CM | POA: Insufficient documentation

## 2020-12-26 DIAGNOSIS — R918 Other nonspecific abnormal finding of lung field: Secondary | ICD-10-CM | POA: Diagnosis not present

## 2020-12-26 DIAGNOSIS — R059 Cough, unspecified: Secondary | ICD-10-CM | POA: Diagnosis not present

## 2020-12-26 DIAGNOSIS — R509 Fever, unspecified: Secondary | ICD-10-CM | POA: Insufficient documentation

## 2020-12-26 DIAGNOSIS — M791 Myalgia, unspecified site: Secondary | ICD-10-CM | POA: Insufficient documentation

## 2020-12-26 DIAGNOSIS — J189 Pneumonia, unspecified organism: Secondary | ICD-10-CM | POA: Diagnosis not present

## 2020-12-26 DIAGNOSIS — Z5321 Procedure and treatment not carried out due to patient leaving prior to being seen by health care provider: Secondary | ICD-10-CM | POA: Diagnosis not present

## 2020-12-26 DIAGNOSIS — J029 Acute pharyngitis, unspecified: Secondary | ICD-10-CM | POA: Diagnosis not present

## 2020-12-26 DIAGNOSIS — I700 Atherosclerosis of aorta: Secondary | ICD-10-CM | POA: Diagnosis not present

## 2020-12-26 DIAGNOSIS — R6889 Other general symptoms and signs: Secondary | ICD-10-CM | POA: Diagnosis not present

## 2020-12-26 DIAGNOSIS — I10 Essential (primary) hypertension: Secondary | ICD-10-CM | POA: Diagnosis not present

## 2020-12-26 LAB — RESP PANEL BY RT-PCR (FLU A&B, COVID) ARPGX2
Influenza A by PCR: NEGATIVE
Influenza B by PCR: NEGATIVE
SARS Coronavirus 2 by RT PCR: NEGATIVE

## 2020-12-26 LAB — GROUP A STREP BY PCR: Group A Strep by PCR: NOT DETECTED

## 2020-12-26 MED ORDER — ACETAMINOPHEN 325 MG PO TABS
650.0000 mg | ORAL_TABLET | Freq: Once | ORAL | Status: AC | PRN
  Administered 2020-12-26: 650 mg via ORAL
  Filled 2020-12-26: qty 2

## 2020-12-26 NOTE — ED Triage Notes (Signed)
Pt c/o fever, chills and generalized body aches x 4 days.

## 2020-12-26 NOTE — ED Provider Notes (Signed)
Emergency Medicine Provider Triage Evaluation Note  Sanders Manninen , a 50 y.o. male  was evaluated in triage.  Pt complains of 4 days of fevers, body aches, chills, cough sore throat.  He states that he spent some time with his grandson last weekend he was recently diagnosed with pneumonia.  Symptoms began 4 days ago.  He has been taking Zicam with some relief, however today felt worse and it was not relieving his symptoms.  After some time in the waiting room, the patient started to complain of feeling as though his throat was more swollen and I was called to evaluate the patient. No chest pain, mild SOB, no drooling, no inability to swallow  Review of Systems  Positive: As above Negative: As above   Physical Exam  BP 119/75   Pulse (!) 119   Temp (!) 101.2 F (38.4 C)   Resp (!) 22   SpO2 94%  Gen:   Awake, no distress   Resp:  Normal effort, scattered rhonchi throughout  MSK:   Moves extremities without difficulty  Other:  Oropharynx erythematous without exudates, uvula midline, no unilateral tonsillar swelling, tongue normal size and midline, no sublingual/submandibular swellimg, tolerating secretions well    Medical Decision Making  Medically screening exam initiated at 2:43 AM.  Appropriate orders placed.  Samrat Donato Studley was informed that the remainder of the evaluation will be completed by another provider, this initial triage assessment does not replace that evaluation, and the importance of remaining in the ED until their evaluation is complete.  Will add on strep, chest xray, COVID/flu pending    Leone Brand 12/26/20 0245    Zadie Rhine, MD 12/26/20 [phone]

## 2020-12-26 NOTE — ED Notes (Signed)
Pt seen leaving ED and getting into car. 

## 2021-04-20 ENCOUNTER — Other Ambulatory Visit: Payer: Self-pay

## 2021-04-20 ENCOUNTER — Emergency Department (HOSPITAL_COMMUNITY)
Admission: EM | Admit: 2021-04-20 | Discharge: 2021-04-20 | Disposition: A | Discharge status: Home / Self Care | Payer: BC Managed Care – PPO | Source: Home / Self Care | Attending: Emergency Medicine | Admitting: Emergency Medicine

## 2021-04-20 DIAGNOSIS — I10 Essential (primary) hypertension: Secondary | ICD-10-CM | POA: Diagnosis not present

## 2021-04-20 DIAGNOSIS — E871 Hypo-osmolality and hyponatremia: Secondary | ICD-10-CM | POA: Diagnosis not present

## 2021-04-20 DIAGNOSIS — E669 Obesity, unspecified: Secondary | ICD-10-CM | POA: Diagnosis not present

## 2021-04-20 DIAGNOSIS — R600 Localized edema: Secondary | ICD-10-CM | POA: Diagnosis not present

## 2021-04-20 DIAGNOSIS — M79671 Pain in right foot: Secondary | ICD-10-CM

## 2021-04-20 DIAGNOSIS — A419 Sepsis, unspecified organism: Secondary | ICD-10-CM | POA: Diagnosis not present

## 2021-04-20 DIAGNOSIS — Z9861 Coronary angioplasty status: Secondary | ICD-10-CM | POA: Diagnosis not present

## 2021-04-20 DIAGNOSIS — I2510 Atherosclerotic heart disease of native coronary artery without angina pectoris: Secondary | ICD-10-CM | POA: Diagnosis not present

## 2021-04-20 DIAGNOSIS — E785 Hyperlipidemia, unspecified: Secondary | ICD-10-CM | POA: Diagnosis not present

## 2021-04-20 DIAGNOSIS — N454 Abscess of epididymis or testis: Secondary | ICD-10-CM | POA: Insufficient documentation

## 2021-04-20 DIAGNOSIS — N492 Inflammatory disorders of scrotum: Secondary | ICD-10-CM | POA: Diagnosis not present

## 2021-04-20 DIAGNOSIS — Z86711 Personal history of pulmonary embolism: Secondary | ICD-10-CM | POA: Diagnosis not present

## 2021-04-20 DIAGNOSIS — Z95828 Presence of other vascular implants and grafts: Secondary | ICD-10-CM | POA: Diagnosis not present

## 2021-04-20 DIAGNOSIS — Z20822 Contact with and (suspected) exposure to covid-19: Secondary | ICD-10-CM | POA: Diagnosis not present

## 2021-04-20 DIAGNOSIS — Z888 Allergy status to other drugs, medicaments and biological substances status: Secondary | ICD-10-CM | POA: Diagnosis not present

## 2021-04-20 DIAGNOSIS — Z7901 Long term (current) use of anticoagulants: Secondary | ICD-10-CM | POA: Insufficient documentation

## 2021-04-20 DIAGNOSIS — M19071 Primary osteoarthritis, right ankle and foot: Secondary | ICD-10-CM | POA: Diagnosis not present

## 2021-04-20 DIAGNOSIS — E1165 Type 2 diabetes mellitus with hyperglycemia: Secondary | ICD-10-CM | POA: Diagnosis not present

## 2021-04-20 DIAGNOSIS — I25119 Atherosclerotic heart disease of native coronary artery with unspecified angina pectoris: Secondary | ICD-10-CM | POA: Diagnosis not present

## 2021-04-20 DIAGNOSIS — E1152 Type 2 diabetes mellitus with diabetic peripheral angiopathy with gangrene: Secondary | ICD-10-CM | POA: Diagnosis not present

## 2021-04-20 DIAGNOSIS — Z720 Tobacco use: Secondary | ICD-10-CM | POA: Diagnosis not present

## 2021-04-20 DIAGNOSIS — N4889 Other specified disorders of penis: Secondary | ICD-10-CM | POA: Diagnosis not present

## 2021-04-20 DIAGNOSIS — Z955 Presence of coronary angioplasty implant and graft: Secondary | ICD-10-CM | POA: Diagnosis not present

## 2021-04-20 DIAGNOSIS — R601 Generalized edema: Secondary | ICD-10-CM | POA: Diagnosis not present

## 2021-04-20 DIAGNOSIS — Z6838 Body mass index (BMI) 38.0-38.9, adult: Secondary | ICD-10-CM | POA: Diagnosis not present

## 2021-04-20 DIAGNOSIS — L0291 Cutaneous abscess, unspecified: Secondary | ICD-10-CM

## 2021-04-20 DIAGNOSIS — R6520 Severe sepsis without septic shock: Secondary | ICD-10-CM | POA: Diagnosis not present

## 2021-04-20 DIAGNOSIS — N493 Fournier gangrene: Secondary | ICD-10-CM | POA: Diagnosis not present

## 2021-04-20 DIAGNOSIS — N433 Hydrocele, unspecified: Secondary | ICD-10-CM | POA: Diagnosis not present

## 2021-04-20 DIAGNOSIS — I252 Old myocardial infarction: Secondary | ICD-10-CM | POA: Diagnosis not present

## 2021-04-20 DIAGNOSIS — A401 Sepsis due to streptococcus, group B: Secondary | ICD-10-CM | POA: Diagnosis not present

## 2021-04-20 DIAGNOSIS — Z79899 Other long term (current) drug therapy: Secondary | ICD-10-CM | POA: Diagnosis not present

## 2021-04-20 DIAGNOSIS — F17210 Nicotine dependence, cigarettes, uncomplicated: Secondary | ICD-10-CM | POA: Diagnosis present

## 2021-04-20 DIAGNOSIS — Z87438 Personal history of other diseases of male genital organs: Secondary | ICD-10-CM | POA: Diagnosis not present

## 2021-04-20 DIAGNOSIS — E118 Type 2 diabetes mellitus with unspecified complications: Secondary | ICD-10-CM | POA: Diagnosis not present

## 2021-04-20 DIAGNOSIS — Z7984 Long term (current) use of oral hypoglycemic drugs: Secondary | ICD-10-CM | POA: Diagnosis not present

## 2021-04-20 DIAGNOSIS — I825Y9 Chronic embolism and thrombosis of unspecified deep veins of unspecified proximal lower extremity: Secondary | ICD-10-CM | POA: Diagnosis not present

## 2021-04-20 DIAGNOSIS — N5089 Other specified disorders of the male genital organs: Secondary | ICD-10-CM | POA: Diagnosis not present

## 2021-04-20 DIAGNOSIS — M7989 Other specified soft tissue disorders: Secondary | ICD-10-CM | POA: Diagnosis not present

## 2021-04-20 DIAGNOSIS — Z7902 Long term (current) use of antithrombotics/antiplatelets: Secondary | ICD-10-CM | POA: Diagnosis not present

## 2021-04-20 MED ORDER — OXYCODONE HCL 5 MG PO TABS
5.0000 mg | ORAL_TABLET | Freq: Once | ORAL | Status: AC
  Administered 2021-04-20: 5 mg via ORAL
  Filled 2021-04-20: qty 1

## 2021-04-20 MED ORDER — DICLOFENAC SODIUM 1 % EX GEL: 4.0000 g | Freq: Four times a day (QID) | CUTANEOUS | 0 refills | Status: DC

## 2021-04-20 MED ORDER — ACETAMINOPHEN 500 MG PO TABS
1000.0000 mg | ORAL_TABLET | Freq: Once | ORAL | Status: AC
  Administered 2021-04-20: 1000 mg via ORAL
  Filled 2021-04-20: qty 2

## 2021-04-20 MED ORDER — KETOROLAC TROMETHAMINE 15 MG/ML IJ SOLN
15.0000 mg | Freq: Once | INTRAMUSCULAR | Status: AC
  Administered 2021-04-20: 15 mg via INTRAMUSCULAR
  Filled 2021-04-20: qty 1

## 2021-04-20 MED ORDER — DOXYCYCLINE HYCLATE 100 MG PO CAPS: 100.0000 mg | ORAL_CAPSULE | Freq: Two times a day (BID) | ORAL | 0 refills | Status: DC

## 2021-04-20 MED ORDER — DOXYCYCLINE HYCLATE 100 MG PO TABS
100.0000 mg | ORAL_TABLET | Freq: Once | ORAL | Status: AC
  Administered 2021-04-20: 100 mg via ORAL
  Filled 2021-04-20: qty 1

## 2021-04-20 NOTE — ED Triage Notes (Signed)
Pt arrives to ED POV c/o abscess on testicles and pain that goes down his leg x2 weeks. Pt states he is barley able to close his legs or bare weight on his RT leg. ?

## 2021-04-20 NOTE — ED Provider Notes (Signed)
?[facility] ?Provider Note ? ? ?CSN: [id number] ?Arrival date & time: 04/20/21  0008 ? ?  ? ?History ? ?Chief Complaint  ?Patient presents with  ? Abscess  ? ? ?Greg Quinn is a 51 y.o. male. ? ?51 year old male with a chief complaints of a swollen area on his testicle and right leg pain.  The testicular region has been swollen for about 48 hours or so.  No fevers or chills.  Has been able to express some stuff that looks like pus.  His right leg has been hurting for about a week or 2.  Pain starts at the bottom of his foot and then he feels it goes up the medial aspect of the leg.  He denies trauma to the area.  Pain usually worse with bearing weight and trying to ambulate.  Denies pain to the back denies loss of bowel or bladder nasals.  Sensation. ? ?The history is provided by the patient.  ?Abscess ? ?  ? ?Home Medications ?Prior to Admission medications   ?Medication Sig Start Date End Date Taking? Authorizing Provider  ?diclofenac Sodium (VOLTAREN) 1 % GEL Apply 4 g topically 4 (four) times daily. 04/20/21  Yes Deno Etienne, DO  ?doxycycline (VIBRAMYCIN) 100 MG capsule Take 1 capsule (100 mg total) by mouth 2 (two) times daily. One po bid x 7 days 04/20/21  Yes Deno Etienne, DO  ?amLODipine (NORVASC) 5 MG tablet Take 1 tablet (5 mg total) by mouth daily. 08/21/20   Margie Billet, NP  ?Ascorbic Acid (VITAMIN C) 1000 MG tablet Take 1,000 mg by mouth 2 (two) times daily.    [provider]  ?atorvastatin (LIPITOR) 80 MG tablet Take 1 tablet (80 mg total) by mouth daily. 08/21/20   Margie Billet, NP  ?clopidogrel (PLAVIX) 75 MG tablet Take 1 tablet (75 mg total) by mouth daily. 08/22/20   Margie Billet, NP  ?glipiZIDE (GLUCOTROL) 10 MG tablet Take 0.5 tablets (5 mg total) by mouth daily before breakfast. 08/21/20   Margie Billet, NP  ?lisinopril (ZESTRIL) 20 MG tablet Take 1 tablet (20 mg total) by mouth daily. 08/21/20   Margie Billet, NP  ?metFORMIN (GLUCOPHAGE) 1000 MG tablet  Take 1 tablet (1,000 mg total) by mouth 2 (two) times daily. Start on 08/23/20 08/23/20   Margie Billet, NP  ?metoprolol succinate (TOPROL-XL) 25 MG 24 hr tablet Take 0.5 tablets (12.5 mg total) by mouth daily. 08/21/20   Margie Billet, NP  ?Multiple Vitamins-Minerals (MULTIVITAMIN GUMMIES ADULT PO) Take by mouth daily.    [provider]  ?nitroGLYCERIN (NITROSTAT) 0.4 MG SL tablet Place 1 tablet (0.4 mg total) under the tongue every 5 (five) minutes as needed for chest pain. 08/21/20 08/21/21  Margie Billet, NP  ?ondansetron (ZOFRAN ODT) 8 MG disintegrating tablet Take 1 tablet (8 mg total) by mouth every 8 (eight) hours as needed. 11/10/20   Molpus, John, MD  ?OVER THE COUNTER MEDICATION Take 2 tablets by mouth daily. Alpha King fat burning pill 30 mins before largest meal.    [provider]  ?oxycodone (OXY-IR) 5 MG capsule Take 1 capsule (5 mg total) by mouth every 4 (four) hours as needed (For pancreatitis pain). 11/10/20   Molpus, John, MD  ?pantoprazole (PROTONIX) 40 MG tablet Take 1 tablet (40 mg total) by mouth daily. 08/21/20 08/21/21  Margie Billet, NP  ?Semaglutide 7 MG TABS Take 1 tablet by mouth daily. 08/01/20   [provider]  ?XARELTO 20 MG TABS  tablet Take 20 mg by mouth daily. 06/11/20   [provider]  ?   ? ?Allergies    ?Heparin   ? ?Review of Systems   ?Review of Systems ? ?Physical Exam ?Updated Vital Signs ?BP (!) 155/95   Pulse 85   Temp 98.2 ?F (36.8 ?C)   Resp 18   SpO2 99%  ?Physical Exam ?Vitals and nursing note reviewed.  ?Constitutional:   ?   Appearance: He is well-developed.  ?HENT:  ?   Head: Normocephalic and atraumatic.  ?Eyes:  ?   Pupils: Pupils are equal, round, and reactive to light.  ?Neck:  ?   Vascular: No JVD.  ?Cardiovascular:  ?   Rate and Rhythm: Normal rate and regular rhythm.  ?   Heart sounds: No murmur heard. ?  No friction rub. No gallop.  ?Pulmonary:  ?   Effort: No respiratory distress.  ?   Breath sounds: No wheezing.  ?Abdominal:  ?    General: There is no distension.  ?   Tenderness: There is no abdominal tenderness. There is no guarding or rebound.  ?Genitourinary: ?   Comments: Indurated area with some thinning of the skin with some easily expressible purulence. ?Musculoskeletal:     ?   General: Normal range of motion.  ?   Cervical back: Normal range of motion and neck supple.  ?   Comments: Pulse motor and sensation intact distally.  Reflexes 2+ and equal.  No clonus. ? ?Pain along the arch of the right foot.  Otherwise benign exam.  Chronic skin changes noted to the other leg.  ?Skin: ?   Coloration: Skin is not pale.  ?   Findings: No rash.  ?Neurological:  ?   Mental Status: He is alert and oriented to person, place, and time.  ?Psychiatric:     ?   Behavior: Behavior normal.  ? ? ?ED Results / Procedures / Treatments   ?Labs ?(all labs ordered are listed, but only abnormal results are displayed) ?Labs Reviewed - No data to display ? ?EKG ?None ? ?Radiology ?No results found. ? ?Procedures ?Procedures  ? ? ?Medications Ordered in ED ?Medications  ?doxycycline (VIBRA-TABS) tablet 100 mg (100 mg Oral Given 04/20/21 0228)  ?ketorolac (TORADOL) 15 MG/ML injection 15 mg (15 mg Intramuscular Given 04/20/21 0229)  ?acetaminophen (TYLENOL) tablet 1,000 mg (1,000 mg Oral Given 04/20/21 0228)  ?oxyCODONE (Oxy IR/ROXICODONE) immediate release tablet 5 mg (5 mg Oral Given 04/20/21 0229)  ? ? ?ED Course/ Medical Decision Making/ A&P ?  ?                        ?Medical Decision Making ?Risk ?OTC drugs. ?Prescription drug management. ? ? ?51 yo M with a chief complaints of likely abscess on his scrotum and right leg pain.  The seem to be 2 separate complaints.  His abscess on his scrotum is in a spot that I am not completely comfortable with doing an I&D.  It is already open and draining.  We will start him on antibiotics.  Warm compresses. ? ?The right leg pain starts at the foot and travels upward.  The only area of reproducible pain is in the arch.   Could be Planter fasciitis, could be neuropathy.  Will give follow-up for podiatry.  Diclofenac gel.  PCP follow-up. ? ?2:34 AM:  I have discussed the diagnosis/risks/treatment options with the patient.  Evaluation and diagnostic testing in the emergency department  does not suggest an emergent condition requiring admission or immediate intervention beyond what has been performed at this time.  They will follow up with  PCP, podiatry. We also discussed returning to the ED immediately if new or worsening sx occur. We discussed the sx which are most concerning (e.g., sudden worsening pain, fever, inability to tolerate by mouth) that necessitate immediate return. Medications administered to the patient during their visit and any new prescriptions provided to the patient are listed below. ? ?Medications given during this visit ?Medications  ?doxycycline (VIBRA-TABS) tablet 100 mg (100 mg Oral Given 04/20/21 0228)  ?ketorolac (TORADOL) 15 MG/ML injection 15 mg (15 mg Intramuscular Given 04/20/21 0229)  ?acetaminophen (TYLENOL) tablet 1,000 mg (1,000 mg Oral Given 04/20/21 0228)  ?oxyCODONE (Oxy IR/ROXICODONE) immediate release tablet 5 mg (5 mg Oral Given 04/20/21 0229)  ? ? ? ?The patient appears reasonably screen and/or stabilized for discharge and I doubt any other medical condition or other [facility] requiring further screening, evaluation, or treatment in the ED at this time prior to discharge.  ? ? ? ? ? ? ? ? ?Final Clinical Impression(s) / ED Diagnoses ?Final diagnoses:  ?Abscess  ?Right foot pain  ? ? ?Rx / DC Orders ?ED Discharge Orders   ? ?      Ordered  ?  doxycycline (VIBRAMYCIN) 100 MG capsule  2 times daily       ? 04/20/21 0221  ?  diclofenac Sodium (VOLTAREN) 1 % GEL  4 times daily       ? 04/20/21 0221  ? ?  ?  ? ?  ? ? ?  ?Deno Etienne, DO ?04/20/21 0234 ? ?

## 2021-04-20 NOTE — Discharge Instructions (Signed)
Use the gel as prescribed.  ?Also take tylenol 1000mg (2 extra strength) four times a day.  ?  ?Warm compresses at least 4 times a day.  Return for rapid spreading redness or if you develop a fever. ?

## 2021-04-22 ENCOUNTER — Emergency Department (HOSPITAL_COMMUNITY): Payer: BC Managed Care – PPO

## 2021-04-22 ENCOUNTER — Inpatient Hospital Stay (HOSPITAL_COMMUNITY)
Admission: EM | Admit: 2021-04-22 | Discharge: 2021-05-01 | DRG: 854 | Disposition: A | Discharge status: Home Health | Payer: BC Managed Care – PPO | Attending: Internal Medicine | Admitting: Internal Medicine

## 2021-04-22 DIAGNOSIS — F17210 Nicotine dependence, cigarettes, uncomplicated: Secondary | ICD-10-CM | POA: Diagnosis present

## 2021-04-22 DIAGNOSIS — I10 Essential (primary) hypertension: Secondary | ICD-10-CM | POA: Diagnosis present

## 2021-04-22 DIAGNOSIS — I2510 Atherosclerotic heart disease of native coronary artery without angina pectoris: Secondary | ICD-10-CM

## 2021-04-22 DIAGNOSIS — Z833 Family history of diabetes mellitus: Secondary | ICD-10-CM

## 2021-04-22 DIAGNOSIS — E669 Obesity, unspecified: Secondary | ICD-10-CM

## 2021-04-22 DIAGNOSIS — E1152 Type 2 diabetes mellitus with diabetic peripheral angiopathy with gangrene: Secondary | ICD-10-CM | POA: Diagnosis present

## 2021-04-22 DIAGNOSIS — M19071 Primary osteoarthritis, right ankle and foot: Secondary | ICD-10-CM | POA: Diagnosis present

## 2021-04-22 DIAGNOSIS — E1165 Type 2 diabetes mellitus with hyperglycemia: Secondary | ICD-10-CM | POA: Diagnosis not present

## 2021-04-22 DIAGNOSIS — Z720 Tobacco use: Secondary | ICD-10-CM | POA: Diagnosis present

## 2021-04-22 DIAGNOSIS — E785 Hyperlipidemia, unspecified: Secondary | ICD-10-CM | POA: Diagnosis present

## 2021-04-22 DIAGNOSIS — N492 Inflammatory disorders of scrotum: Secondary | ICD-10-CM | POA: Diagnosis present

## 2021-04-22 DIAGNOSIS — Z86711 Personal history of pulmonary embolism: Secondary | ICD-10-CM

## 2021-04-22 DIAGNOSIS — A401 Sepsis due to streptococcus, group B: Principal | ICD-10-CM | POA: Diagnosis present

## 2021-04-22 DIAGNOSIS — R6520 Severe sepsis without septic shock: Secondary | ICD-10-CM | POA: Diagnosis present

## 2021-04-22 DIAGNOSIS — M79671 Pain in right foot: Secondary | ICD-10-CM

## 2021-04-22 DIAGNOSIS — Z7984 Long term (current) use of oral hypoglycemic drugs: Secondary | ICD-10-CM

## 2021-04-22 DIAGNOSIS — Z888 Allergy status to other drugs, medicaments and biological substances status: Secondary | ICD-10-CM

## 2021-04-22 DIAGNOSIS — Z7902 Long term (current) use of antithrombotics/antiplatelets: Secondary | ICD-10-CM

## 2021-04-22 DIAGNOSIS — Z6839 Body mass index (BMI) 39.0-39.9, adult: Secondary | ICD-10-CM

## 2021-04-22 DIAGNOSIS — E118 Type 2 diabetes mellitus with unspecified complications: Secondary | ICD-10-CM | POA: Diagnosis present

## 2021-04-22 DIAGNOSIS — Z95828 Presence of other vascular implants and grafts: Secondary | ICD-10-CM

## 2021-04-22 DIAGNOSIS — Z86718 Personal history of other venous thrombosis and embolism: Secondary | ICD-10-CM

## 2021-04-22 DIAGNOSIS — N433 Hydrocele, unspecified: Secondary | ICD-10-CM | POA: Diagnosis present

## 2021-04-22 DIAGNOSIS — A419 Sepsis, unspecified organism: Secondary | ICD-10-CM | POA: Diagnosis present

## 2021-04-22 DIAGNOSIS — I82409 Acute embolism and thrombosis of unspecified deep veins of unspecified lower extremity: Secondary | ICD-10-CM | POA: Diagnosis present

## 2021-04-22 DIAGNOSIS — Z7901 Long term (current) use of anticoagulants: Secondary | ICD-10-CM

## 2021-04-22 DIAGNOSIS — Z79899 Other long term (current) drug therapy: Secondary | ICD-10-CM

## 2021-04-22 DIAGNOSIS — N493 Fournier gangrene: Principal | ICD-10-CM | POA: Diagnosis present

## 2021-04-22 DIAGNOSIS — I252 Old myocardial infarction: Secondary | ICD-10-CM

## 2021-04-22 DIAGNOSIS — E871 Hypo-osmolality and hyponatremia: Secondary | ICD-10-CM | POA: Diagnosis present

## 2021-04-22 DIAGNOSIS — Z955 Presence of coronary angioplasty implant and graft: Secondary | ICD-10-CM

## 2021-04-22 DIAGNOSIS — Z20822 Contact with and (suspected) exposure to covid-19: Secondary | ICD-10-CM | POA: Diagnosis present

## 2021-04-22 LAB — CBC WITH DIFFERENTIAL/PLATELET
Abs Immature Granulocytes: 0.06 10*3/uL (ref 0.00–0.07)
Basophils Absolute: 0 10*3/uL (ref 0.0–0.1)
Basophils Relative: 0 %
Eosinophils Absolute: 0 10*3/uL (ref 0.0–0.5)
Eosinophils Relative: 0 %
HCT: 43.1 % (ref 39.0–52.0)
Hemoglobin: 14.6 g/dL (ref 13.0–17.0)
Immature Granulocytes: 0 %
Lymphocytes Relative: 9 %
Lymphs Abs: 1.6 10*3/uL (ref 0.7–4.0)
MCH: 30 pg (ref 26.0–34.0)
MCHC: 33.9 g/dL (ref 30.0–36.0)
MCV: 88.7 fL (ref 80.0–100.0)
Monocytes Absolute: 1.5 10*3/uL — ABNORMAL HIGH (ref 0.1–1.0)
Monocytes Relative: 9 %
Neutro Abs: 14.2 10*3/uL — ABNORMAL HIGH (ref 1.7–7.7)
Neutrophils Relative %: 82 %
Platelets: 253 10*3/uL (ref 150–400)
RBC: 4.86 MIL/uL (ref 4.22–5.81)
RDW: 12.5 % (ref 11.5–15.5)
WBC: 17.4 10*3/uL — ABNORMAL HIGH (ref 4.0–10.5)
nRBC: 0 % (ref 0.0–0.2)

## 2021-04-22 LAB — COMPREHENSIVE METABOLIC PANEL
ALT: 16 U/L (ref 0–44)
AST: 17 U/L (ref 15–41)
Albumin: 3.3 g/dL — ABNORMAL LOW (ref 3.5–5.0)
Alkaline Phosphatase: 102 U/L (ref 38–126)
Anion gap: 8 (ref 5–15)
BUN: 11 mg/dL (ref 6–20)
CO2: 26 mmol/L (ref 22–32)
Calcium: 9.3 mg/dL (ref 8.9–10.3)
Chloride: 100 mmol/L (ref 98–111)
Creatinine, Ser: 0.95 mg/dL (ref 0.61–1.24)
GFR, Estimated: >60 mL/min (ref >60)
Glucose, Bld: 289 mg/dL — ABNORMAL HIGH (ref 70–99)
Potassium: 4 mmol/L (ref 3.5–5.1)
Sodium: 134 mmol/L — ABNORMAL LOW (ref 135–145)
Total Bilirubin: 0.6 mg/dL (ref 0.3–1.2)
Total Protein: 7.4 g/dL (ref 6.5–8.1)

## 2021-04-22 LAB — URINALYSIS, ROUTINE W REFLEX MICROSCOPIC
Bilirubin Urine: NEGATIVE
Glucose, UA: >=500 mg/dL — AB
Ketones, ur: 15 mg/dL — AB
Leukocytes,Ua: NEGATIVE
Nitrite: NEGATIVE
Protein, ur: 30 mg/dL — AB
Specific Gravity, Urine: >1.03 — ABNORMAL HIGH (ref 1.005–1.030)
pH: 6 (ref 5.0–8.0)

## 2021-04-22 LAB — URINALYSIS, MICROSCOPIC (REFLEX): Squamous Epithelial / HPF: NONE SEEN (ref 0–5)

## 2021-04-22 LAB — LACTIC ACID, PLASMA
Lactic Acid, Venous: 1 mmol/L (ref 0.5–1.9)
Lactic Acid, Venous: 1.3 mmol/L (ref 0.5–1.9)

## 2021-04-22 MED ORDER — IOHEXOL 300 MG/ML  SOLN
100.0000 mL | Freq: Once | INTRAMUSCULAR | Status: AC | PRN
  Administered 2021-04-22: 100 mL via INTRAVENOUS

## 2021-04-22 MED ORDER — VANCOMYCIN HCL 1250 MG/250ML IV SOLN
1250.0000 mg | Freq: Three times a day (TID) | INTRAVENOUS | Status: DC
  Administered 2021-04-23: 1250 mg via INTRAVENOUS
  Filled 2021-04-22 (×2): qty 250

## 2021-04-22 MED ORDER — PIPERACILLIN-TAZOBACTAM 3.375 G IVPB
3.3750 g | Freq: Three times a day (TID) | INTRAVENOUS | Status: DC
  Administered 2021-04-22 – 2021-04-26 (×11): 3.375 g via INTRAVENOUS
  Filled 2021-04-22 (×11): qty 50

## 2021-04-22 MED ORDER — VANCOMYCIN HCL 1750 MG/350ML IV SOLN
1750.0000 mg | Freq: Once | INTRAVENOUS | Status: AC
  Administered 2021-04-22: 1750 mg via INTRAVENOUS
  Filled 2021-04-22: qty 350

## 2021-04-22 MED ORDER — ACETAMINOPHEN 325 MG PO TABS
650.0000 mg | ORAL_TABLET | Freq: Once | ORAL | Status: AC
  Administered 2021-04-22: 650 mg via ORAL
  Filled 2021-04-22: qty 2

## 2021-04-22 MED ORDER — ONDANSETRON HCL 4 MG/2ML IJ SOLN
4.0000 mg | Freq: Once | INTRAMUSCULAR | Status: AC
  Administered 2021-04-22: 4 mg via INTRAVENOUS
  Filled 2021-04-22: qty 2

## 2021-04-22 MED ORDER — MORPHINE SULFATE (PF) 4 MG/ML IV SOLN
4.0000 mg | Freq: Once | INTRAVENOUS | Status: AC
  Administered 2021-04-22: 4 mg via INTRAVENOUS
  Filled 2021-04-22: qty 1

## 2021-04-22 NOTE — ED Notes (Signed)
The pt reports that he has had an abscess on his scrotum for several days  getting worse with more pain a and o x 4 ?

## 2021-04-22 NOTE — ED Provider Notes (Addendum)
?[facility] EMERGENCY DEPARTMENT ?Provider Note ? ? ?CSN: [id number] ?Arrival date & time: 04/22/21  1519 ? ?  ? ?History ? ?Chief Complaint  ?Patient presents with  ? Abscess  ? ? ?Greg Quinn is a 51 y.o. male with known scrotal infection who presents with concern for enlarging of the scrotum, worsening pain, fevers, and chills.  Patient was seen in the ED on 3/27 diagnosed with small scrotal abscess in the left hemiscrotum.  Started on doxycycline, which he states he has been taking as prescribed but states that his scrotum "has been ballooning since I was seen".  Significant worsening pain worse in the left on the right.  States he is unable to walk normally. ? ?I personally reviewed the patient's medical records.  His history of smoking, DVT, bilateral PE, NSTEMI.  He is on Xarelto for anticoagulation, does have diabetes on semaglutide and metformin as well as glipizide.  On lisinopril, Toprol, and amlodipine for blood pressure. ? ?HPI ? ?  ? ?Home Medications ?Prior to Admission medications   ?Medication Sig Start Date End Date Taking? Authorizing Provider  ?amLODipine (NORVASC) 5 MG tablet Take 1 tablet (5 mg total) by mouth daily. 08/21/20   Cyndi Bender, NP  ?Ascorbic Acid (VITAMIN C) 1000 MG tablet Take 1,000 mg by mouth 2 (two) times daily.    [provider]  ?atorvastatin (LIPITOR) 80 MG tablet Take 1 tablet (80 mg total) by mouth daily. 08/21/20   Cyndi Bender, NP  ?clopidogrel (PLAVIX) 75 MG tablet Take 1 tablet (75 mg total) by mouth daily. 08/22/20   Cyndi Bender, NP  ?diclofenac Sodium (VOLTAREN) 1 % GEL Apply 4 g topically 4 (four) times daily. 04/20/21   Melene Plan, DO  ?doxycycline (VIBRAMYCIN) 100 MG capsule Take 1 capsule (100 mg total) by mouth 2 (two) times daily. One po bid x 7 days 04/20/21   Melene Plan, DO  ?glipiZIDE (GLUCOTROL) 10 MG tablet Take 0.5 tablets (5 mg total) by mouth daily before breakfast. 08/21/20   Cyndi Bender, NP  ?lisinopril (ZESTRIL) 20 MG  tablet Take 1 tablet (20 mg total) by mouth daily. 08/21/20   Cyndi Bender, NP  ?metFORMIN (GLUCOPHAGE) 1000 MG tablet Take 1 tablet (1,000 mg total) by mouth 2 (two) times daily. Start on 08/23/20 08/23/20   Cyndi Bender, NP  ?metoprolol succinate (TOPROL-XL) 25 MG 24 hr tablet Take 0.5 tablets (12.5 mg total) by mouth daily. 08/21/20   Cyndi Bender, NP  ?Multiple Vitamins-Minerals (MULTIVITAMIN GUMMIES ADULT PO) Take by mouth daily.    [provider]  ?nitroGLYCERIN (NITROSTAT) 0.4 MG SL tablet Place 1 tablet (0.4 mg total) under the tongue every 5 (five) minutes as needed for chest pain. 08/21/20 08/21/21  Cyndi Bender, NP  ?ondansetron (ZOFRAN ODT) 8 MG disintegrating tablet Take 1 tablet (8 mg total) by mouth every 8 (eight) hours as needed. 11/10/20   Molpus, John, MD  ?OVER THE COUNTER MEDICATION Take 2 tablets by mouth daily. Alpha King fat burning pill 30 mins before largest meal.    [provider]  ?oxycodone (OXY-IR) 5 MG capsule Take 1 capsule (5 mg total) by mouth every 4 (four) hours as needed (For pancreatitis pain). 11/10/20   Molpus, John, MD  ?pantoprazole (PROTONIX) 40 MG tablet Take 1 tablet (40 mg total) by mouth daily. 08/21/20 08/21/21  Cyndi Bender, NP  ?Semaglutide 7 MG TABS Take 1 tablet by mouth daily. 08/01/20   [provider]  ?XARELTO 20 MG TABS  tablet Take 20 mg by mouth daily. 06/11/20   [provider]  ?   ? ?Allergies    ?Heparin   ? ?Review of Systems   ?Review of Systems  ?Constitutional:  Positive for appetite change, chills, fatigue and fever. Negative for activity change.  ?HENT: Negative.    ?Respiratory: Negative.    ?Cardiovascular: Negative.   ?Gastrointestinal: Negative.   ?Genitourinary:  Positive for penile swelling, scrotal swelling and testicular pain. Negative for decreased urine volume, difficulty urinating, dysuria, enuresis, flank pain, hematuria, penile discharge, penile pain and urgency.  ?Musculoskeletal: Negative.   ?Neurological: Negative.    ? ?Physical Exam ?Updated Vital Signs ?BP 136/78   Pulse 93   Temp 99.6 ?F (37.6 ?C)   Resp 16   Wt 116.8 kg   SpO2 94%   BMI 36.42 kg/m?  ?Physical Exam ?Vitals and nursing note reviewed. Exam conducted with a chaperone present.  ?Constitutional:   ?   Appearance: He is obese. He is not ill-appearing or toxic-appearing.  ?   Comments: Visibly uncomfortable  ?HENT:  ?   Head: Normocephalic and atraumatic.  ?   Nose: Nose normal.  ?   Mouth/Throat:  ?   Mouth: Mucous membranes are moist.  ?   Pharynx: Oropharynx is clear. Uvula midline. No oropharyngeal exudate or posterior oropharyngeal erythema.  ?   Tonsils: No tonsillar exudate.  ?Eyes:  ?   General: Lids are normal. Vision grossly intact.     ?   Right eye: No discharge.     ?   Left eye: No discharge.  ?   Extraocular Movements: Extraocular movements intact.  ?   Conjunctiva/sclera: Conjunctivae normal.  ?   Pupils: Pupils are equal, round, and reactive to light.  ?Neck:  ?   Trachea: Trachea and phonation normal.  ?   Meningeal: Brudzinski's sign and Kernig's sign absent.  ?Cardiovascular:  ?   Rate and Rhythm: Normal rate and regular rhythm.  ?   Pulses: Normal pulses.  ?   Heart sounds: Normal heart sounds. No murmur heard. ?Pulmonary:  ?   Effort: Pulmonary effort is normal. No tachypnea, bradypnea, accessory muscle usage, prolonged expiration or respiratory distress.  ?   Breath sounds: Normal breath sounds. No wheezing or rales.  ?Chest:  ?   Chest wall: No mass, lacerations, deformity, swelling, tenderness, crepitus or edema.  ?Abdominal:  ?   General: Bowel sounds are normal. There is no distension.  ?   Palpations: Abdomen is soft.  ?   Tenderness: There is no abdominal tenderness. There is no right CVA tenderness, left CVA tenderness, guarding or rebound.  ?Genitourinary: ?   Penis: Circumcised. Swelling present.   ?   Testes:     ?   Right: Swelling present.     ?   Left: Mass, tenderness and swelling present.  ?   Comments: Scrotum edematous,  with significant induration of the right hemiscrotum.  Open draining area of fluctuance centrally with purulent drainage.  Exquisitely tender to palpation.  Penis also with edematous though without erythema or tenderness palpation.  No extension of erythema or induration into the inguinal fold or medial thigh bilaterally.  See photos below. ?Musculoskeletal:     ?   General: No deformity.  ?   Cervical back: Normal range of motion and neck supple. No tenderness.  ?   Right lower leg: No edema.  ?   Left lower leg: No edema.  ?Lymphadenopathy:  ?  Cervical: No cervical adenopathy.  ?Skin: ?   General: Skin is warm and dry.  ?   Capillary Refill: Capillary refill takes less than 2 seconds.  ?   Findings: Abscess present.  ?Neurological:  ?   General: No focal deficit present.  ?   Mental Status: He is alert and oriented to person, place, and time. Mental status is at baseline.  ?Psychiatric:     ?   Mood and Affect: Mood normal.  ? ? ? ? ? ?ED Results / Procedures / Treatments   ?Labs ?(all labs ordered are listed, but only abnormal results are displayed) ?Labs Reviewed  ?COMPREHENSIVE METABOLIC PANEL - Abnormal; Notable for the following components:  ?    Result Value  ? Sodium 134 (*)   ? Glucose, Bld 289 (*)   ? Albumin 3.3 (*)   ? All other components within normal limits  ?CBC WITH DIFFERENTIAL/PLATELET - Abnormal; Notable for the following components:  ? WBC 17.4 (*)   ? Neutro Abs 14.2 (*)   ? Monocytes Absolute 1.5 (*)   ? All other components within normal limits  ?URINALYSIS, ROUTINE W REFLEX MICROSCOPIC - Abnormal; Notable for the following components:  ? Specific Gravity, Urine >1.030 (*)   ? Glucose, UA >=500 (*)   ? Hgb urine dipstick TRACE (*)   ? Ketones, ur 15 (*)   ? Protein, ur 30 (*)   ? All other components within normal limits  ?URINALYSIS, MICROSCOPIC (REFLEX) - Abnormal; Notable for the following components:  ? Bacteria, UA RARE (*)   ? All other components within normal limits  ?LACTIC ACID,  PLASMA  ?LACTIC ACID, PLASMA  ? ? ?EKG ?None ? ?Radiology ?CT ABDOMEN PELVIS W CONTRAST ? ?Result Date: 04/22/2021 ?CLINICAL DATA:  Scrotal mass or lump scrotal abscess EXAM: CT ABDOMEN AND PELVIS WITH CONTRAS

## 2021-04-22 NOTE — Progress Notes (Signed)
Pharmacy Antibiotic Note ? ?Greg Quinn is a 51 y.o. male admitted on 04/22/2021 with  a scrotal abscess .  Pharmacy has been consulted for vancomycin dosing. Patient received doxycycline on 3/27 for the abscess, but reported to the ED today due to new draining and increase in size. Patient reports fevers and pain at home. WBC is 17.4 and temp is 99.56F. Lactic acid is 1 and CrCl is 117.3 mL/min. PMH is significant for diabetes. ? ?Plan: ?Give vancomycin 1750 mg load ?Give vancomycin 1250 mg every 8 hours for [facility] ?Zosyn per MD ?F/u clinical progress and deescalate as indicated ? ?Weight: 116.8 kg (257 lb 8 oz) ? ?Temp (24hrs), Avg:99.3 ?F (37.4 ?C), Min:99.1 ?F (37.3 ?C), Max:99.4 ?F (37.4 ?C) ? ?Recent Labs  ?Lab 04/22/21 ?1808  ?WBC 17.4*  ?CREATININE 0.95  ?LATICACIDVEN 1.0  ?  ?Estimated Creatinine Clearance: 117.3 mL/min (by C-G formula based on SCr of 0.95 mg/dL).   ? ?Allergies  ?Allergen Reactions  ? Heparin Other (See Comments)  ? ? ?Antimicrobials this admission: ?Vancomycin 3/29 >>  ?Zosyn 3/29 >>  ? ?Dose adjustments this admission: ?none ? ?Microbiology results: ?none ? ?Thank you for allowing pharmacy to participate in this patient's care. ? ?Enos Fling, PharmD ?PGY1 Pharmacy Resident ?04/22/2021 7:25 PM ?Check [email or web address] for unit specific pharmacy number ? ? ?

## 2021-04-22 NOTE — ED Triage Notes (Signed)
Pt c/o increasing size of abscess on L testicle, first noticed 3/24. Seen 3/27 for same, given abx & advised on warm compress. Pt advises that abscess is draining but concern due to increasing size. Endorses fevers at home, pain. ?
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
## 2021-04-22 NOTE — ED Provider Triage Note (Signed)
Emergency Medicine Provider Triage Evaluation Note ? ?Khyree [address] [place] , a 51 y.o. male  was evaluated in triage.  Pt complains of left scrotal swelling.  He was seen here on the 27th for similar issue.  He was diagnosed with an abscess on his scrotum. It was recommended that he use warm compresses and take doxycycline. Since discharge, his symptoms have worsened. He says that the abscess is draining some, but the pain is unbearable. He had a fever last night. No other complaints ? ?Review of Systems  ?Positive: Scrotal swelling, fever ?Negative:  ? ?Physical Exam  ?BP (!) 152/85 (BP Location: Left Arm)   Pulse (!) 105   Temp 99.1 ?F (37.3 ?C) (Oral)   Resp 16   SpO2 96%  ?Gen:   Awake, no distress   ?Resp:  Normal effort  ?MSK:   Moves extremities without difficulty  ?Other:  Left scrotum enlarged, erythematous, firm to the touch, tender to the touch. Cellulitis does not extend into perineum. Localized to the scrotum.  ? ?Medical Decision Making  ?Medically screening exam initiated at 4:08 PM.  Appropriate orders placed.  Kellin Rebel Willcutt was informed that the remainder of the evaluation will be completed by another provider, this initial triage assessment does not replace that evaluation, and the importance of remaining in the ED until their evaluation is complete. ? ?Labs since he has had fevers. Will get ultrasound of scrotum. ?  Claudie Leach, PA-C ?04/22/21 1612 ? ?

## 2021-04-22 NOTE — ED Notes (Signed)
Headache still  no better ?

## 2021-04-23 ENCOUNTER — Encounter (HOSPITAL_COMMUNITY)
Admission: EM | Disposition: A | Discharge status: Home Health | Payer: Self-pay | Source: Home / Self Care | Attending: Internal Medicine

## 2021-04-23 ENCOUNTER — Inpatient Hospital Stay (HOSPITAL_COMMUNITY): Payer: BC Managed Care – PPO | Admitting: Anesthesiology

## 2021-04-23 ENCOUNTER — Other Ambulatory Visit: Payer: Self-pay

## 2021-04-23 ENCOUNTER — Encounter (HOSPITAL_COMMUNITY): Payer: Self-pay | Admitting: Internal Medicine

## 2021-04-23 DIAGNOSIS — E118 Type 2 diabetes mellitus with unspecified complications: Secondary | ICD-10-CM | POA: Diagnosis not present

## 2021-04-23 DIAGNOSIS — E785 Hyperlipidemia, unspecified: Secondary | ICD-10-CM

## 2021-04-23 DIAGNOSIS — I825Y9 Chronic embolism and thrombosis of unspecified deep veins of unspecified proximal lower extremity: Secondary | ICD-10-CM | POA: Diagnosis not present

## 2021-04-23 DIAGNOSIS — A401 Sepsis due to streptococcus, group B: Secondary | ICD-10-CM | POA: Diagnosis present

## 2021-04-23 DIAGNOSIS — Z95828 Presence of other vascular implants and grafts: Secondary | ICD-10-CM | POA: Diagnosis not present

## 2021-04-23 DIAGNOSIS — Z955 Presence of coronary angioplasty implant and graft: Secondary | ICD-10-CM | POA: Diagnosis not present

## 2021-04-23 DIAGNOSIS — A419 Sepsis, unspecified organism: Secondary | ICD-10-CM

## 2021-04-23 DIAGNOSIS — Z20822 Contact with and (suspected) exposure to covid-19: Secondary | ICD-10-CM | POA: Diagnosis present

## 2021-04-23 DIAGNOSIS — I252 Old myocardial infarction: Secondary | ICD-10-CM | POA: Diagnosis not present

## 2021-04-23 DIAGNOSIS — I2510 Atherosclerotic heart disease of native coronary artery without angina pectoris: Secondary | ICD-10-CM

## 2021-04-23 DIAGNOSIS — I10 Essential (primary) hypertension: Secondary | ICD-10-CM | POA: Diagnosis not present

## 2021-04-23 DIAGNOSIS — Z720 Tobacco use: Secondary | ICD-10-CM

## 2021-04-23 DIAGNOSIS — E1165 Type 2 diabetes mellitus with hyperglycemia: Secondary | ICD-10-CM | POA: Diagnosis not present

## 2021-04-23 DIAGNOSIS — Z7902 Long term (current) use of antithrombotics/antiplatelets: Secondary | ICD-10-CM | POA: Diagnosis not present

## 2021-04-23 DIAGNOSIS — Z7901 Long term (current) use of anticoagulants: Secondary | ICD-10-CM | POA: Diagnosis not present

## 2021-04-23 DIAGNOSIS — N492 Inflammatory disorders of scrotum: Secondary | ICD-10-CM

## 2021-04-23 DIAGNOSIS — N433 Hydrocele, unspecified: Secondary | ICD-10-CM | POA: Diagnosis present

## 2021-04-23 DIAGNOSIS — M19071 Primary osteoarthritis, right ankle and foot: Secondary | ICD-10-CM | POA: Diagnosis present

## 2021-04-23 DIAGNOSIS — M79671 Pain in right foot: Secondary | ICD-10-CM | POA: Diagnosis not present

## 2021-04-23 DIAGNOSIS — E1152 Type 2 diabetes mellitus with diabetic peripheral angiopathy with gangrene: Secondary | ICD-10-CM | POA: Diagnosis present

## 2021-04-23 DIAGNOSIS — E669 Obesity, unspecified: Secondary | ICD-10-CM | POA: Diagnosis present

## 2021-04-23 DIAGNOSIS — N454 Abscess of epididymis or testis: Secondary | ICD-10-CM | POA: Diagnosis present

## 2021-04-23 DIAGNOSIS — Z888 Allergy status to other drugs, medicaments and biological substances status: Secondary | ICD-10-CM | POA: Diagnosis not present

## 2021-04-23 DIAGNOSIS — F17210 Nicotine dependence, cigarettes, uncomplicated: Secondary | ICD-10-CM | POA: Diagnosis present

## 2021-04-23 DIAGNOSIS — Z7984 Long term (current) use of oral hypoglycemic drugs: Secondary | ICD-10-CM | POA: Diagnosis not present

## 2021-04-23 DIAGNOSIS — Z86711 Personal history of pulmonary embolism: Secondary | ICD-10-CM | POA: Diagnosis not present

## 2021-04-23 DIAGNOSIS — Z9861 Coronary angioplasty status: Secondary | ICD-10-CM

## 2021-04-23 DIAGNOSIS — E871 Hypo-osmolality and hyponatremia: Secondary | ICD-10-CM | POA: Diagnosis present

## 2021-04-23 DIAGNOSIS — N493 Fournier gangrene: Secondary | ICD-10-CM | POA: Diagnosis not present

## 2021-04-23 DIAGNOSIS — R6520 Severe sepsis without septic shock: Secondary | ICD-10-CM | POA: Diagnosis present

## 2021-04-23 DIAGNOSIS — Z79899 Other long term (current) drug therapy: Secondary | ICD-10-CM | POA: Diagnosis not present

## 2021-04-23 DIAGNOSIS — I25119 Atherosclerotic heart disease of native coronary artery with unspecified angina pectoris: Secondary | ICD-10-CM | POA: Diagnosis not present

## 2021-04-23 HISTORY — PX: INCISION AND DRAINAGE ABSCESS: SHX5864

## 2021-04-23 HISTORY — PX: CYSTOSCOPY: SHX5120

## 2021-04-23 LAB — CBC WITH DIFFERENTIAL/PLATELET
Abs Immature Granulocytes: 0.08 10*3/uL — ABNORMAL HIGH (ref 0.00–0.07)
Basophils Absolute: 0 10*3/uL (ref 0.0–0.1)
Basophils Relative: 0 %
Eosinophils Absolute: 0 10*3/uL (ref 0.0–0.5)
Eosinophils Relative: 0 %
HCT: 41.1 % (ref 39.0–52.0)
Hemoglobin: 13.8 g/dL (ref 13.0–17.0)
Immature Granulocytes: 1 %
Lymphocytes Relative: 4 %
Lymphs Abs: 0.6 10*3/uL — ABNORMAL LOW (ref 0.7–4.0)
MCH: 29.6 pg (ref 26.0–34.0)
MCHC: 33.6 g/dL (ref 30.0–36.0)
MCV: 88 fL (ref 80.0–100.0)
Monocytes Absolute: 0.3 10*3/uL (ref 0.1–1.0)
Monocytes Relative: 2 %
Neutro Abs: 14.2 10*3/uL — ABNORMAL HIGH (ref 1.7–7.7)
Neutrophils Relative %: 93 %
Platelets: 242 10*3/uL (ref 150–400)
RBC: 4.67 MIL/uL (ref 4.22–5.81)
RDW: 12.6 % (ref 11.5–15.5)
WBC: 15.2 10*3/uL — ABNORMAL HIGH (ref 4.0–10.5)
nRBC: 0 % (ref 0.0–0.2)

## 2021-04-23 LAB — DIFFERENTIAL
Abs Immature Granulocytes: 0.03 10*3/uL (ref 0.00–0.07)
Basophils Absolute: 0 10*3/uL (ref 0.0–0.1)
Basophils Relative: 0 %
Eosinophils Absolute: 0.1 10*3/uL (ref 0.0–0.5)
Eosinophils Relative: 1 %
Immature Granulocytes: 0 %
Lymphocytes Relative: 16 %
Lymphs Abs: 2.2 10*3/uL (ref 0.7–4.0)
Monocytes Absolute: 1.3 10*3/uL — ABNORMAL HIGH (ref 0.1–1.0)
Monocytes Relative: 10 %
Neutro Abs: 10.3 10*3/uL — ABNORMAL HIGH (ref 1.7–7.7)
Neutrophils Relative %: 73 %

## 2021-04-23 LAB — MAGNESIUM
Magnesium: 1.9 mg/dL (ref 1.7–2.4)
Magnesium: 1.8 mg/dL (ref 1.7–2.4)

## 2021-04-23 LAB — GLUCOSE, CAPILLARY
Glucose-Capillary: 197 mg/dL — ABNORMAL HIGH (ref 70–99)
Glucose-Capillary: 195 mg/dL — ABNORMAL HIGH (ref 70–99)
Glucose-Capillary: 235 mg/dL — ABNORMAL HIGH (ref 70–99)
Glucose-Capillary: 447 mg/dL — ABNORMAL HIGH (ref 70–99)
Glucose-Capillary: 432 mg/dL — ABNORMAL HIGH (ref 70–99)
Glucose-Capillary: 410 mg/dL — ABNORMAL HIGH (ref 70–99)

## 2021-04-23 LAB — COMPREHENSIVE METABOLIC PANEL
ALT: 16 U/L (ref 0–44)
AST: 16 U/L (ref 15–41)
Albumin: 2.9 g/dL — ABNORMAL LOW (ref 3.5–5.0)
Alkaline Phosphatase: 96 U/L (ref 38–126)
Anion gap: 9 (ref 5–15)
BUN: 11 mg/dL (ref 6–20)
CO2: 25 mmol/L (ref 22–32)
Calcium: 8.8 mg/dL — ABNORMAL LOW (ref 8.9–10.3)
Chloride: 101 mmol/L (ref 98–111)
Creatinine, Ser: 0.83 mg/dL (ref 0.61–1.24)
GFR, Estimated: >60 mL/min (ref >60)
Glucose, Bld: 283 mg/dL — ABNORMAL HIGH (ref 70–99)
Potassium: 4.3 mmol/L (ref 3.5–5.1)
Sodium: 135 mmol/L (ref 135–145)
Total Bilirubin: 1.1 mg/dL (ref 0.3–1.2)
Total Protein: 6.7 g/dL (ref 6.5–8.1)

## 2021-04-23 LAB — CBC
HCT: 42 % (ref 39.0–52.0)
Hemoglobin: 14 g/dL (ref 13.0–17.0)
MCH: 29.7 pg (ref 26.0–34.0)
MCHC: 33.3 g/dL (ref 30.0–36.0)
MCV: 89 fL (ref 80.0–100.0)
Platelets: 229 10*3/uL (ref 150–400)
RBC: 4.72 MIL/uL (ref 4.22–5.81)
RDW: 12.4 % (ref 11.5–15.5)
WBC: 14 10*3/uL — ABNORMAL HIGH (ref 4.0–10.5)
nRBC: 0 % (ref 0.0–0.2)

## 2021-04-23 LAB — PHOSPHORUS
Phosphorus: 3.3 mg/dL (ref 2.5–4.6)
Phosphorus: 2.5 mg/dL (ref 2.5–4.6)

## 2021-04-23 LAB — HEPATIC FUNCTION PANEL
ALT: 15 U/L (ref 0–44)
AST: 16 U/L (ref 15–41)
Albumin: 3 g/dL — ABNORMAL LOW (ref 3.5–5.0)
Alkaline Phosphatase: 90 U/L (ref 38–126)
Bilirubin, Direct: 0.2 mg/dL (ref 0.0–0.2)
Indirect Bilirubin: 1 mg/dL — ABNORMAL HIGH (ref 0.3–0.9)
Total Bilirubin: 1.2 mg/dL (ref 0.3–1.2)
Total Protein: 6.8 g/dL (ref 6.5–8.1)

## 2021-04-23 LAB — TSH: TSH: 0.466 u[IU]/mL (ref 0.350–4.500)

## 2021-04-23 LAB — RESP PANEL BY RT-PCR (FLU A&B, COVID) ARPGX2
Influenza A by PCR: NEGATIVE
Influenza B by PCR: NEGATIVE
SARS Coronavirus 2 by RT PCR: NEGATIVE

## 2021-04-23 LAB — CORTISOL: Cortisol, Plasma: 5.9 ug/dL

## 2021-04-23 LAB — HEMOGLOBIN A1C
Hgb A1c MFr Bld: 10.7 % — ABNORMAL HIGH (ref 4.8–5.6)
Mean Plasma Glucose: 260.39 mg/dL

## 2021-04-23 LAB — CK: Total CK: 102 U/L (ref 49–397)

## 2021-04-23 LAB — PROCALCITONIN: Procalcitonin: 0.16 ng/mL

## 2021-04-23 LAB — GLUCOSE, RANDOM: Glucose, Bld: 404 mg/dL — ABNORMAL HIGH (ref 70–99)

## 2021-04-23 SURGERY — EXPLORATION, SCROTUM
Anesthesia: General

## 2021-04-23 SURGERY — INCISION AND DRAINAGE, ABSCESS
Anesthesia: General | Site: Urethra

## 2021-04-23 MED ORDER — NICOTINE 7 MG/24HR TD PT24
7.0000 mg | MEDICATED_PATCH | Freq: Every day | TRANSDERMAL | Status: DC
  Administered 2021-04-23: 7 mg via TRANSDERMAL
  Filled 2021-04-23 (×9): qty 1

## 2021-04-23 MED ORDER — ONDANSETRON HCL 4 MG/2ML IJ SOLN: 4.0000 mg | Freq: Once | INTRAMUSCULAR | Status: DC | PRN

## 2021-04-23 MED ORDER — ACETAMINOPHEN 325 MG PO TABS
650.0000 mg | ORAL_TABLET | Freq: Four times a day (QID) | ORAL | Status: DC | PRN
  Administered 2021-04-28: 650 mg via ORAL
  Filled 2021-04-23: qty 2

## 2021-04-23 MED ORDER — ESMOLOL HCL 100 MG/10ML IV SOLN
INTRAVENOUS | Status: AC
  Filled 2021-04-23: qty 10

## 2021-04-23 MED ORDER — ONDANSETRON HCL 4 MG/2ML IJ SOLN
INTRAMUSCULAR | Status: DC | PRN
  Administered 2021-04-23: 4 mg via INTRAVENOUS

## 2021-04-23 MED ORDER — FENTANYL CITRATE (PF) 100 MCG/2ML IJ SOLN
INTRAMUSCULAR | Status: DC | PRN
  Administered 2021-04-23: 100 ug via INTRAVENOUS
  Administered 2021-04-23 (×3): 50 ug via INTRAVENOUS

## 2021-04-23 MED ORDER — ONDANSETRON HCL 4 MG/2ML IJ SOLN
INTRAMUSCULAR | Status: AC
  Filled 2021-04-23: qty 2

## 2021-04-23 MED ORDER — PHENYLEPHRINE 40 MCG/ML (10ML) SYRINGE FOR IV PUSH (FOR BLOOD PRESSURE SUPPORT)
PREFILLED_SYRINGE | INTRAVENOUS | Status: AC
  Filled 2021-04-23: qty 10

## 2021-04-23 MED ORDER — ACETAMINOPHEN 650 MG RE SUPP
650.0000 mg | Freq: Four times a day (QID) | RECTAL | Status: DC | PRN
Start: 2021-04-23 — End: 2021-05-01

## 2021-04-23 MED ORDER — SODIUM CHLORIDE 0.9 % IV SOLN: INTRAVENOUS | Status: DC | PRN

## 2021-04-23 MED ORDER — PROPOFOL 10 MG/ML IV BOLUS
INTRAVENOUS | Status: AC
  Filled 2021-04-23: qty 20

## 2021-04-23 MED ORDER — ROCURONIUM BROMIDE 10 MG/ML (PF) SYRINGE
PREFILLED_SYRINGE | INTRAVENOUS | Status: AC
  Filled 2021-04-23: qty 10

## 2021-04-23 MED ORDER — FENTANYL CITRATE (PF) 250 MCG/5ML IJ SOLN
INTRAMUSCULAR | Status: AC
  Filled 2021-04-23: qty 5

## 2021-04-23 MED ORDER — ROCURONIUM BROMIDE 100 MG/10ML IV SOLN
INTRAVENOUS | Status: DC | PRN
  Administered 2021-04-23: 50 mg via INTRAVENOUS

## 2021-04-23 MED ORDER — LISINOPRIL 20 MG PO TABS
20.0000 mg | ORAL_TABLET | Freq: Every day | ORAL | Status: DC
  Administered 2021-04-23 – 2021-05-01 (×9): 20 mg via ORAL
  Filled 2021-04-23 (×9): qty 1

## 2021-04-23 MED ORDER — PHENYLEPHRINE HCL-NACL 20-0.9 MG/250ML-% IV SOLN
INTRAVENOUS | Status: DC | PRN
Start: 2021-04-23 — End: 2021-04-23
  Administered 2021-04-23: 50 ug/min via INTRAVENOUS

## 2021-04-23 MED ORDER — INSULIN GLARGINE-YFGN 100 UNIT/ML ~~LOC~~ SOLN
10.0000 [IU] | Freq: Every day | SUBCUTANEOUS | Status: DC
  Administered 2021-04-23 – 2021-04-24 (×2): 10 [IU] via SUBCUTANEOUS
  Filled 2021-04-23 (×2): qty 0.1

## 2021-04-23 MED ORDER — ALBUTEROL SULFATE (2.5 MG/3ML) 0.083% IN NEBU: 2.5000 mg | INHALATION_SOLUTION | Freq: Four times a day (QID) | RESPIRATORY_TRACT | Status: DC | PRN

## 2021-04-23 MED ORDER — PROPOFOL 10 MG/ML IV BOLUS
INTRAVENOUS | Status: DC | PRN
  Administered 2021-04-23: 180 mg via INTRAVENOUS

## 2021-04-23 MED ORDER — SODIUM CHLORIDE 0.9 % IR SOLN
Status: DC | PRN
Start: 2021-04-23 — End: 2021-04-23
  Administered 2021-04-23: 3000 mL

## 2021-04-23 MED ORDER — INSULIN ASPART 100 UNIT/ML IJ SOLN
0.0000 [IU] | INTRAMUSCULAR | Status: DC
  Administered 2021-04-23: 9 [IU] via SUBCUTANEOUS

## 2021-04-23 MED ORDER — INSULIN ASPART 100 UNIT/ML IJ SOLN
0.0000 [IU] | Freq: Three times a day (TID) | INTRAMUSCULAR | Status: DC
  Administered 2021-04-23: 15 [IU] via SUBCUTANEOUS
  Administered 2021-04-24: 8 [IU] via SUBCUTANEOUS
  Administered 2021-04-24: 11 [IU] via SUBCUTANEOUS
  Administered 2021-04-24 – 2021-04-25 (×3): 5 [IU] via SUBCUTANEOUS
  Administered 2021-04-25: 8 [IU] via SUBCUTANEOUS
  Administered 2021-04-26: 11 [IU] via SUBCUTANEOUS
  Administered 2021-04-26: 3 [IU] via SUBCUTANEOUS
  Administered 2021-04-26: 8 [IU] via SUBCUTANEOUS
  Administered 2021-04-27: 3 [IU] via SUBCUTANEOUS
  Administered 2021-04-27 – 2021-04-28 (×4): 5 [IU] via SUBCUTANEOUS
  Administered 2021-04-28: 3 [IU] via SUBCUTANEOUS
  Administered 2021-04-29: 2 [IU] via SUBCUTANEOUS
  Administered 2021-04-29: 3 [IU] via SUBCUTANEOUS
  Administered 2021-04-29: 5 [IU] via SUBCUTANEOUS
  Administered 2021-04-30: 3 [IU] via SUBCUTANEOUS
  Administered 2021-04-30: 5 [IU] via SUBCUTANEOUS
  Administered 2021-04-30: 2 [IU] via SUBCUTANEOUS
  Administered 2021-05-01: 5 [IU] via SUBCUTANEOUS
  Administered 2021-05-01: 3 [IU] via SUBCUTANEOUS

## 2021-04-23 MED ORDER — LIDOCAINE 2% (20 MG/ML) 5 ML SYRINGE
INTRAMUSCULAR | Status: AC
  Filled 2021-04-23: qty 5

## 2021-04-23 MED ORDER — HYDROCODONE-ACETAMINOPHEN 5-325 MG PO TABS
1.0000 | ORAL_TABLET | ORAL | Status: DC | PRN
  Administered 2021-04-23 – 2021-04-24 (×4): 2 via ORAL
  Filled 2021-04-23 (×4): qty 2

## 2021-04-23 MED ORDER — MORPHINE SULFATE (PF) 2 MG/ML IV SOLN
2.0000 mg | INTRAVENOUS | Status: DC | PRN
  Administered 2021-04-23 – 2021-05-01 (×27): 2 mg via INTRAVENOUS
  Filled 2021-04-23 (×29): qty 1

## 2021-04-23 MED ORDER — LIDOCAINE HCL (CARDIAC) PF 50 MG/5ML IV SOSY
PREFILLED_SYRINGE | INTRAVENOUS | Status: DC | PRN
  Administered 2021-04-23: 100 mg via INTRAVENOUS

## 2021-04-23 MED ORDER — INSULIN GLARGINE-YFGN 100 UNIT/ML ~~LOC~~ SOLN
10.0000 [IU] | Freq: Once | SUBCUTANEOUS | Status: AC
  Administered 2021-04-23: 10 [IU] via SUBCUTANEOUS
  Filled 2021-04-23: qty 0.1

## 2021-04-23 MED ORDER — LACTATED RINGERS IV SOLN
INTRAVENOUS | Status: DC | PRN
Start: 2021-04-23 — End: 2021-04-23

## 2021-04-23 MED ORDER — CHLORHEXIDINE GLUCONATE CLOTH 2 % EX PADS
6.0000 | MEDICATED_PAD | Freq: Every day | CUTANEOUS | Status: DC
  Administered 2021-04-23 – 2021-05-01 (×8): 6 via TOPICAL

## 2021-04-23 MED ORDER — SODIUM CHLORIDE 0.9 % IV SOLN: INTRAVENOUS | Status: DC

## 2021-04-23 MED ORDER — ARTIFICIAL TEARS OPHTHALMIC OINT
TOPICAL_OINTMENT | OPHTHALMIC | Status: AC
  Filled 2021-04-23: qty 3.5

## 2021-04-23 MED ORDER — FENTANYL CITRATE (PF) 100 MCG/2ML IJ SOLN: 25.0000 ug | INTRAMUSCULAR | Status: DC | PRN

## 2021-04-23 MED ORDER — ATORVASTATIN CALCIUM 80 MG PO TABS
80.0000 mg | ORAL_TABLET | Freq: Every day | ORAL | Status: DC
  Administered 2021-04-23 – 2021-04-30 (×8): 80 mg via ORAL
  Filled 2021-04-23 (×8): qty 1

## 2021-04-23 MED ORDER — MIDAZOLAM HCL 2 MG/2ML IJ SOLN
INTRAMUSCULAR | Status: AC
  Filled 2021-04-23: qty 2

## 2021-04-23 MED ORDER — DEXAMETHASONE SODIUM PHOSPHATE 4 MG/ML IJ SOLN
INTRAMUSCULAR | Status: DC | PRN
  Administered 2021-04-23: 10 mg via INTRAVENOUS

## 2021-04-23 MED ORDER — SUGAMMADEX SODIUM 200 MG/2ML IV SOLN
INTRAVENOUS | Status: DC | PRN
Start: 2021-04-23 — End: 2021-04-23
  Administered 2021-04-23: 300 mg via INTRAVENOUS

## 2021-04-23 MED ORDER — MIDAZOLAM HCL 5 MG/5ML IJ SOLN
INTRAMUSCULAR | Status: DC | PRN
  Administered 2021-04-23: 2 mg via INTRAVENOUS

## 2021-04-23 MED ORDER — SUCCINYLCHOLINE CHLORIDE 200 MG/10ML IV SOSY
PREFILLED_SYRINGE | INTRAVENOUS | Status: AC
  Filled 2021-04-23: qty 10

## 2021-04-23 MED ORDER — INSULIN ASPART 100 UNIT/ML IJ SOLN
0.0000 [IU] | Freq: Every day | INTRAMUSCULAR | Status: DC
  Administered 2021-04-23: 5 [IU] via SUBCUTANEOUS

## 2021-04-23 MED ORDER — 0.9 % SODIUM CHLORIDE (POUR BTL) OPTIME
TOPICAL | Status: DC | PRN
  Administered 2021-04-23: 1000 mL

## 2021-04-23 MED ORDER — SUCCINYLCHOLINE CHLORIDE 200 MG/10ML IV SOSY
PREFILLED_SYRINGE | INTRAVENOUS | Status: DC | PRN
  Administered 2021-04-23: 130 mg via INTRAVENOUS

## 2021-04-23 MED ORDER — INSULIN ASPART 100 UNIT/ML IJ SOLN
INTRAMUSCULAR | Status: DC | PRN
  Administered 2021-04-23: 4 [IU] via SUBCUTANEOUS

## 2021-04-23 MED ORDER — DEXAMETHASONE SODIUM PHOSPHATE 10 MG/ML IJ SOLN
INTRAMUSCULAR | Status: AC
  Filled 2021-04-23: qty 1

## 2021-04-23 MED ORDER — VANCOMYCIN HCL 1250 MG/250ML IV SOLN
1250.0000 mg | Freq: Two times a day (BID) | INTRAVENOUS | Status: DC
  Administered 2021-04-23 – 2021-04-25 (×4): 1250 mg via INTRAVENOUS
  Filled 2021-04-23 (×5): qty 250

## 2021-04-23 MED ORDER — EPHEDRINE 5 MG/ML INJ
INTRAVENOUS | Status: AC
  Filled 2021-04-23: qty 5

## 2021-04-23 MED ORDER — ESMOLOL HCL 100 MG/10ML IV SOLN
INTRAVENOUS | Status: DC | PRN
  Administered 2021-04-23 (×3): 20 mg via INTRAVENOUS
  Administered 2021-04-23: 10 mg via INTRAVENOUS
  Administered 2021-04-23: 20 mg via INTRAVENOUS

## 2021-04-23 SURGICAL SUPPLY — 63 items
BAG COUNTER SPONGE SURGICOUNT (BAG) ×6 IMPLANT
BAG DECANTER FOR FLEXI CONT (MISCELLANEOUS) ×3 IMPLANT
BAG URO CATCHER STRL LF (MISCELLANEOUS) ×3 IMPLANT
BLADE SURG 10 STRL SS (BLADE) ×1 IMPLANT
BLADE SURG 15 STRL LF DISP TIS (BLADE) ×2 IMPLANT
BLADE SURG 15 STRL SS (BLADE)
BNDG GAUZE ELAST 4 BULKY (GAUZE/BANDAGES/DRESSINGS) ×1 IMPLANT
CLEANER TIP ELECTROSURG 2X2 (MISCELLANEOUS) ×3 IMPLANT
CNTNR URN SCR LID CUP LEK RST (MISCELLANEOUS) IMPLANT
CONT SPEC 4OZ STRL OR WHT (MISCELLANEOUS) ×1
COVER SURGICAL LIGHT HANDLE (MISCELLANEOUS) ×6 IMPLANT
DRAPE CAMERA CLOSED 9X96 (DRAPES) ×3 IMPLANT
DRAPE HALF SHEET 40X57 (DRAPES) IMPLANT
DRAPE LAPAROTOMY T 102X78X121 (DRAPES) IMPLANT
DRSG PAD ABDOMINAL 8X10 ST (GAUZE/BANDAGES/DRESSINGS) ×1 IMPLANT
ELECT CAUTERY BLADE 6.4 (BLADE) ×3 IMPLANT
ELECT REM PT RETURN 9FT ADLT (ELECTROSURGICAL) ×3
ELECTRODE REM PT RTRN 9FT ADLT (ELECTROSURGICAL) ×2 IMPLANT
GAUZE 4X4 16PLY ~~LOC~~+RFID DBL (SPONGE) ×4 IMPLANT
GAUZE SPONGE 4X4 12PLY STRL (GAUZE/BANDAGES/DRESSINGS) ×1 IMPLANT
GLOVE SURG ENC MOIS LTX SZ7.5 (GLOVE) ×3 IMPLANT
GLOVE SURG ENC MOIS LTX SZ8 (GLOVE) ×3 IMPLANT
GOWN STRL REUS W/ TWL LRG LVL3 (GOWN DISPOSABLE) ×4 IMPLANT
GOWN STRL REUS W/ TWL XL LVL3 (GOWN DISPOSABLE) ×2 IMPLANT
GOWN STRL REUS W/TWL LRG LVL3 (GOWN DISPOSABLE) ×2
GOWN STRL REUS W/TWL XL LVL3 (GOWN DISPOSABLE) ×1
HANDPIECE INTERPULSE COAX TIP (DISPOSABLE)
KIT BASIN OR (CUSTOM PROCEDURE TRAY) ×3 IMPLANT
KIT TURNOVER KIT B (KITS) ×6 IMPLANT
LEGGING LITHOTOMY PAIR STRL (DRAPES) IMPLANT
MANIFOLD NEPTUNE II (INSTRUMENTS) ×3 IMPLANT
NDL 18GX1X1/2 (RX/OR ONLY) (NEEDLE) ×2 IMPLANT
NDL HYPO 25X1 1.5 SAFETY (NEEDLE) IMPLANT
NEEDLE 18GX1X1/2 (RX/OR ONLY) (NEEDLE) ×3 IMPLANT
NEEDLE HYPO 22GX1.5 SAFETY (NEEDLE) ×3 IMPLANT
NEEDLE HYPO 25X1 1.5 SAFETY (NEEDLE) IMPLANT
NS IRRIG 1000ML POUR BTL (IV SOLUTION) ×3 IMPLANT
PACK CYSTO (CUSTOM PROCEDURE TRAY) ×3 IMPLANT
PACK SURGICAL SETUP 50X90 (CUSTOM PROCEDURE TRAY) ×3 IMPLANT
PAD ARMBOARD 7.5X6 YLW CONV (MISCELLANEOUS) ×6 IMPLANT
PENCIL BUTTON HOLSTER BLD 10FT (ELECTRODE) ×3 IMPLANT
PLUG CATH AND CAP STER (CATHETERS) ×1 IMPLANT
SET HNDPC FAN SPRY TIP SCT (DISPOSABLE) IMPLANT
SET IRRIG Y TYPE TUR BLADDER L (SET/KITS/TRAYS/PACK) ×3 IMPLANT
SOL PREP POV-IOD 4OZ 10% (MISCELLANEOUS) ×3 IMPLANT
SPONGE T-LAP 18X18 ~~LOC~~+RFID (SPONGE) ×2 IMPLANT
SPONGE T-LAP 4X18 ~~LOC~~+RFID (SPONGE) IMPLANT
SUT CHROMIC 3 0 PS 2 (SUTURE) IMPLANT
SUT CHROMIC 3 0 SH 27 (SUTURE) IMPLANT
SUT CHROMIC 4 0 RB 1X27 (SUTURE) IMPLANT
SUT VIC AB 2-0 SH 27 (SUTURE) ×1
SUT VIC AB 2-0 SH 27XBRD (SUTURE) IMPLANT
SUT VIC AB 3-0 SH 27 (SUTURE) ×1
SUT VIC AB 3-0 SH 27X BRD (SUTURE) IMPLANT
SWAB CULTURE ESWAB REG 1ML (MISCELLANEOUS) ×1 IMPLANT
SYR 20ML LL LF (SYRINGE) ×3 IMPLANT
SYR CONTROL 10ML LL (SYRINGE) ×3 IMPLANT
TOWEL GREEN STERILE FF (TOWEL DISPOSABLE) ×3 IMPLANT
TRAY FOLEY SLVR 16FR LF STAT (SET/KITS/TRAYS/PACK) ×1 IMPLANT
TUBE CONNECTING 12X1/4 (SUCTIONS) ×4 IMPLANT
WATER STERILE IRR 1000ML POUR (IV SOLUTION) ×3 IMPLANT
WATER STERILE IRR 3000ML UROMA (IV SOLUTION) ×3 IMPLANT
YANKAUER SUCT BULB TIP NO VENT (SUCTIONS) ×3 IMPLANT

## 2021-04-23 NOTE — Progress Notes (Addendum)
Pharmacy Antibiotic Note ? ?Greg Quinn is a 51 y.o. male admitted on 04/22/2021 with scrotal abscess and early Fournier's gangrene s/p I&D 3/30. Pharmacy has been consulted for vancomycin and piperacillin/tazobactam dosing. ? ?Cultures are growing GPC and GNR. WBC down trending.  ? ?3/30 Vancomycin 1250mg  Q 12 hr ?Scr used: 0.82 mg/dL ?Weight: 123.5 kg ?Vd coeff: 0.5 L/kg ?Est AUC: 421 ? ? ?Plan: ?Decrease vancomycin 1250 mg Q 12 hours   ?Piperacillin/tazobactam 3.375g Q 8hr  ?Monitor cultures, clinical status, renal function, vancomycin level ?Narrow abx as able and f/u duration  ? ? ?Height: 5' 10.5" (179.1 cm) ?Weight: 123.5 kg (272 lb 4.3 oz) ?IBW/kg (Calculated) : 74.15 ? ?Temp (24hrs), Avg:98.7 ?F (37.1 ?C), Min:97.8 ?F (36.6 ?C), Max:99.6 ?F (37.6 ?C) ? ?Recent Labs  ?Lab 04/22/21 ?1808 04/22/21 ?1902 04/23/21 ?0125 04/23/21 ?04/25/21  ?WBC 17.4*  --  14.0* 15.2*  ?CREATININE 0.95  --   --  0.83  ?LATICACIDVEN 1.0 1.3  --   --   ? ?  ?Estimated Creatinine Clearance: 138.3 mL/min (by C-G formula based on SCr of 0.83 mg/dL).   ? ?Allergies  ?Allergen Reactions  ? Heparin Other (See Comments)  ? Pork-Derived Products   ? ? ?Antimicrobials this admission: ?Vancomycin 3/29 >>  ?Zosyn 3/29 >>  ? ?Dose adjustments this admission: ?none ? ?Microbiology results: ?3/30 OR abscess: Few GPC, few GNR ? ?Thank you for allowing pharmacy to participate in this patient's care. ? ?4/30, PharmD, BCPS, BCCP ?Clinical Pharmacist ? ?Please check AMION for all [facility] Pharmacy phone numbers ?After 10:00 PM, call Main Pharmacy [phone] ? ? ?

## 2021-04-23 NOTE — Progress Notes (Signed)
?PROGRESS NOTE ? ?[employer]  ?DOB: [birth year]  ?PCP: Pcp, No ?BJS:[id number]  ?DOA: 04/22/2021 ? LOS: 0 days  ?Hospital Day: 2 ? ?Brief narrative: ?Greg Quinn is a 51 y.o. male with PMH significant for poorly controlled diabetes, HTN, HLD, CAD/NSTEMI on Plavix, DVT/PE on Xarelto, s/p IVC filter, current everyday smoker. ?Patient presented to the ED on 3/29 with 1 week of scrotal infection/abscess which progressively worsened over the last 24 hours ?As an outpatient, patient was tried on oral doxycycline without success.  No fever but endorses chills ? ?In the ED, patient was slightly tachycardic to 100s, blood pressure in 150s ?Labs with WC count elevated to 17.4, blood glucose level elevated to 89 ?CT abdomen pelvis showed subcutaneous soft tissue edema of the bilateral scrotum and penis, no evidence of gas.  Unable to rule out Fournier's gangrene. ?Admitted to hospital service ?Urology was consulted  ? ? ?Subjective: ?Patient was seen and examined this morning.  Pleasant middle-aged African-American male.  Lying on bed.  Not in distress.  Pain controlled. ?Chart reviewed ?Remains slightly tachycardic ?Repeat labs this morning with WBC count 15.2 ? ?Principal Problem: ?  Sepsis (HCC) ?Active Problems: ?  DM (diabetes mellitus), type 2 with complications (HCC) ?  Hypertension ?  Hyperlipidemia LDL goal <70 ?  DVT, lower extremity (HCC) ?  CAD S/P percutaneous coronary angioplasty ?  Scrotal infection ?  Tobacco abuse ?  ? ?Assessment and Plan: ?Scrotal infection/abscess ?Early Fournier's gangrene ?-Seen by urologist.  Lottie Rater emergent I&D of scrotum and debridement of necrotic tissue with irrigation and Foley catheter. ?-Wound culture was sent. ?-Currently on IV vancomycin and IV Zosyn. ?-Continue IV hydration with NS at 100 mill per hour at this time ?-Continue monitor WBC count ?-Pain control with Norco. ?Recent Labs  ?Lab 04/22/21 ?1808 04/22/21 ?1902 04/23/21 ?0125 04/23/21 ?6073   ?WBC 17.4*  --  14.0* 15.2*  ?LATICACIDVEN 1.0 1.3  --   --   ?PROCALCITON  --   --  0.16  --   ? ?Uncontrolled type 2 diabetes mellitus with hyperglycemia ?-A1c 10.7 on 04/23/2021 ?-Home meds include metformin 750 mg daily ?-Currently on sliding scale insulin with Accu-Cheks.  Blood sugar level over 200.  Start on Semglee 10 units from this morning. ?-Consult diabetes care coordinator. ?Recent Labs  ?Lab 04/23/21 ?0158 04/23/21 ?0321 04/23/21 ?0415 04/23/21 ?1019  ?GLUCAP 197* 195* 235* 447*  ? ?CAD s/p stent ?Hyperlipidemia ?-Patient underwent left heart cath and stent placement on July 2022 ?-PTA on aspirin 81 mg daily and Plavix 75 mg daily, Lipitor 80 mg daily ?-Currently both are on hold as patient may need to be back to the OR again..  Continue statin. ? ?Essential hypertension ?-PTA, on lisinopril 20 mg daily ?-Resume lisinopril ? ?History of recurrent DVT/PE  ?s/p IVC filter ?-PTA, patient was on Xarelto which is currently on hold. ?-Plan to resume once okay with urology. ?-Patient says he is allergic to heparin products.  He could not specify but he states his platelet level dropped with it presumably HIT. ? ?chronic daily smoker ?-Counseled to quit. ?-Nicotine patch offered. ? ?Goals of care ?  Code Status: Full Code  ? ? ?Mobility: Encourage ambulation. ? ?Nutritional status:  ?Body mass index is 38.51 kg/m?.  ?  ?  ? ? ? ? ?Diet:  ?Diet Order   ? ?       ?  Diet Carb Modified Fluid consistency: Thin; Room service appropriate? Yes  Diet effective now       ?  ? ?  ?  ? ?  ? ? ?  DVT prophylaxis:  ?SCDs Start: 04/23/21 0427 ?  ?Antimicrobials: Currently on IV Zosyn and vancomycin ?Fluid: NS at 100 mill per hour ?Consultants: Urology ?Family Communication: None at bedside ? ?Status is: Inpatient ? ?Continue in-hospital care because: POD 0 ?Level of care: Progressive  ? ?Dispo: The patient is from: Home ?             Anticipated d/c is to: Pending clinical course ?             Patient currently is not  medically stable to d/c. ?  Difficult to place patient No ? ? ? ? ?Infusions:  ? sodium chloride 100 mL/hr at 04/23/21 0539  ? sodium chloride 10 mL/hr at 04/23/21 0539  ? piperacillin-tazobactam (ZOSYN)  IV 12.5 mL/hr at 04/23/21 0540  ? vancomycin    ? ? ?Scheduled Meds: ? atorvastatin  80 mg Oral QHS  ? insulin aspart  0-9 Units Subcutaneous Q4H  ? insulin glargine-yfgn  10 Units Subcutaneous Daily  ? lisinopril  20 mg Oral Daily  ? nicotine  7 mg Transdermal Daily  ? ? ?PRN meds: ?sodium chloride, acetaminophen **OR** acetaminophen, albuterol, HYDROcodone-acetaminophen, morphine injection  ? ?Antimicrobials: ?Anti-infectives (From admission, onward)  ? ? Start     Dose/Rate Route Frequency Ordered Stop  ? 04/23/21 1800  vancomycin (VANCOREADY) IVPB 1250 mg/250 mL       ? 1,250 mg ?166.7 mL/hr over 90 Minutes Intravenous Every 12 hours 04/23/21 0950    ? 04/23/21 0400  vancomycin (VANCOREADY) IVPB 1250 mg/250 mL  Status:  Discontinued       ? 1,250 mg ?166.7 mL/hr over 90 Minutes Intravenous Every 8 hours 04/22/21 1926 04/23/21 0950  ? 04/22/21 2200  piperacillin-tazobactam (ZOSYN) IVPB 3.375 g       ? 3.375 g ?12.5 mL/hr over 240 Minutes Intravenous Every 8 hours 04/22/21 1859    ? 04/22/21 1915  vancomycin (VANCOREADY) IVPB 1750 mg/350 mL       ? 1,750 mg ?175 mL/hr over 120 Minutes Intravenous  Once 04/22/21 1913 04/22/21 2300  ? ?  ? ? ?Objective: ?Vitals:  ? 04/23/21 0915 04/23/21 1021  ?BP:  (!) 141/87  ?Pulse: 98 92  ?Resp:  16  ?Temp:  98.1 ?F (36.7 ?C)  ?SpO2:  98%  ? ? ?Intake/Output Summary (Last 24 hours) at 04/23/2021 1238 ?Last data filed at 04/23/2021 1026 ?Gross per 24 hour  ?Intake 1205.47 ml  ?Output 1955 ml  ?Net -749.53 ml  ? ?Filed Weights  ? 04/22/21 1900 04/23/21 0430  ?Weight: 116.8 kg 123.5 kg  ? ?Weight change:  ?Body mass index is 38.51 kg/m?.  ? ?Physical Exam: ?General exam: Pleasant, middle-aged African-American male.  Not in physical distress ?Skin: No rashes, lesions or  ulcers. ?HEENT: Atraumatic, normocephalic, no obvious bleeding ?Lungs: Clear to auscultate bilaterally ?CVS: Regular rate and rhythm, no murmur ?GI/Abd soft, nontender, nondistended, bowel sound present ?CNS: Alert, awake, oriented x3 ?Psychiatry: Mood appropriate ?Extremities: No pedal edema, no calf tenderness ? ?Data Review: I have personally reviewed the laboratory data and studies available. ? ?F/u labs ordered ?Unresulted Labs (From admission, onward)  ? ?  Start     Ordered  ? 04/24/21 0500  CBC with Differential/Platelet  Daily,   R     ? 04/23/21 1237  ? 04/24/21 0500  Basic metabolic panel  Daily,   R     ? 04/23/21 1237  ? 04/23/21 1048  Glucose, random  ONCE - STAT,  STAT       ?Comments: Point of care capillary blood glucose 447. ?  ? 04/23/21 1047  ? 04/23/21 0217  Urine Culture  RELEASE UPON ORDERING,   TIMED       ?Comments: Specimen A: Phone [phone] ?Immunocompromised?  No  ?Antibiotic Treatment:  none ?Is the patient on airborne/droplet precautions? No ?Clinical History:  gangrene ?Special Instructions:  none ?Specimen Disposition:  Microbiology ? ?  ? 04/23/21 0217  ? 04/23/21 0101  Culture, blood (x 2)  BLOOD CULTURE X 2,   STAT     ?Comments: INITIATE ANTIBIOTICS WITHIN 1 HOUR AFTER BLOOD CULTURES DRAWN.  If unable to obtain blood cultures, call MD immediately regarding antibiotic instructions. ?  ? 04/23/21 0101  ? 04/23/21 0101  Urine Culture  Add-on,   AD       ?Question:  Indication  Answer:  Sepsis  ? 04/23/21 0101  ? ?  ?  ? ?  ? ? ?Signed, ?Lorin GlassBinaya Marielys Trinidad, MD ?[facility] ?04/23/2021 ? ? ? ? ? ? ? ? ? ? ? ? ?

## 2021-04-23 NOTE — Consult Note (Addendum)
Urology Consult Note   Requesting Attending Physician:  Dione Booze, MD Service Providing Consult: Urology  Consulting Attending: Alfredo Martinez, MD   Reason for Consult:  Fournier's Gangrene  HPI: Greg Quinn is seen in consultation for reasons noted above at the request of Dione Booze, MD for evaluation of possible Fournier's gangrene of the scrotum.  This is a 51 y.o. male with Hx of HTN, CAD, PE, and poorly controlled diabetes who presents with ~ 1 week of scrotal abscess/infection, which has progressively worsened over the last 24 hours. He was seen a few days ago for this issue and was started on Doxycycline, which he has been taking. He has not had any fevers, but does endorse chills.   In the ED, he is AF, borderline tachy. He does have a leukocytosis to 17.4. Blood glucose is elevated to 289. Lactate is 1.0.    CT scan was concerning for scrotal infection, but no evidence of gas present.   Greg Quinn: voids q 1-2 hours with good flow; chills over last few days; no surgical hx; significant cardiac history   Past Medical History: Past Medical History:  Diagnosis Date   Bilateral pulmonary embolism (HCC)    2009, on xarelto, IVC filter   Coronary artery disease    Current every day smoker    25 pack year history   DVT, lower extremity (HCC)    2009   Hyperlipidemia LDL goal <70    Hypertension    NSTEMI (non-ST elevated myocardial infarction) (HCC) 08/20/2020   Uncontrolled diabetes mellitus     Past Surgical History:  Past Surgical History:  Procedure Laterality Date   CORONARY STENT INTERVENTION N/A 08/20/2020   Procedure: CORONARY STENT INTERVENTION;  Surgeon: Corky Crafts, MD;  Location: [facility] INVASIVE CV LAB;  Service: Cardiovascular;  Laterality: N/A;   INTRAVASCULAR ULTRASOUND/IVUS N/A 08/20/2020   Procedure: Intravascular Ultrasound/IVUS;  Surgeon: Corky Crafts, MD;  Location[facility] INVASIVE CV LAB;  Service: Cardiovascular;   Laterality: N/A;   LEFT HEART CATH AND CORONARY ANGIOGRAPHY N/A 08/20/2020   Procedure: LEFT HEART CATH AND CORONARY ANGIOGRAPHY;  Surgeon: Corky Crafts, MD;  Location: [facility] INVASIVE CV LAB;  Service: Cardiovascular;  Laterality: N/A;    Medication: Current Facility-Administered Medications  Medication Dose Route Frequency Provider Last Rate Last Admin   piperacillin-tazobactam (ZOSYN) IVPB 3.375 g  3.375 g Intravenous Q8H Sponseller, Rebekah R, PA-C 12.5 mL/hr at 04/22/21 2305 3.375 g at 04/22/21 2305   vancomycin (VANCOREADY) IVPB 1250 mg/250 mL  1,250 mg Intravenous Q8H Marygrace Drought, RPH       Current Outpatient Medications  Medication Sig Dispense Refill   amLODipine (NORVASC) 5 MG tablet Take 1 tablet (5 mg total) by mouth daily. 90 tablet 1   Ascorbic Acid (VITAMIN C) 1000 MG tablet Take 1,000 mg by mouth 2 (two) times daily.     atorvastatin (LIPITOR) 80 MG tablet Take 1 tablet (80 mg total) by mouth daily. 90 tablet 1   clopidogrel (PLAVIX) 75 MG tablet Take 1 tablet (75 mg total) by mouth daily. 90 tablet 1   diclofenac Sodium (VOLTAREN) 1 % GEL Apply 4 g topically 4 (four) times daily. 100 g 0   doxycycline (VIBRAMYCIN) 100 MG capsule Take 1 capsule (100 mg total) by mouth 2 (two) times daily. One po bid x 7 days 14 capsule 0   glipiZIDE (GLUCOTROL) 10 MG tablet Take 0.5 tablets (5 mg total) by mouth daily before breakfast. 15 tablet 0  lisinopril (ZESTRIL) 20 MG tablet Take 1 tablet (20 mg total) by mouth daily. 90 tablet 1   metFORMIN (GLUCOPHAGE) 1000 MG tablet Take 1 tablet (1,000 mg total) by mouth 2 (two) times daily. Start on 08/23/20 60 tablet 0   metoprolol succinate (TOPROL-XL) 25 MG 24 hr tablet Take 0.5 tablets (12.5 mg total) by mouth daily. 90 tablet 1   Multiple Vitamins-Minerals (MULTIVITAMIN GUMMIES ADULT PO) Take by mouth daily.     nitroGLYCERIN (NITROSTAT) 0.4 MG SL tablet Place 1 tablet (0.4 mg total) under the tongue every 5 (five) minutes as needed for  chest pain. 20 tablet 1   ondansetron (ZOFRAN ODT) 8 MG disintegrating tablet Take 1 tablet (8 mg total) by mouth every 8 (eight) hours as needed. 10 tablet 0   OVER THE COUNTER MEDICATION Take 2 tablets by mouth daily. Alpha King fat burning pill 30 mins before largest meal.     oxycodone (OXY-IR) 5 MG capsule Take 1 capsule (5 mg total) by mouth every 4 (four) hours as needed (For pancreatitis pain). 20 capsule 0   pantoprazole (PROTONIX) 40 MG tablet Take 1 tablet (40 mg total) by mouth daily. 30 tablet 0   Semaglutide 7 MG TABS Take 1 tablet by mouth daily.     XARELTO 20 MG TABS tablet Take 20 mg by mouth daily.      Allergies: Allergies  Allergen Reactions   Heparin Other (See Comments)    Social History: Social History   Tobacco Use   Smoking status: Every Day    Packs/day: 1.00    Types: Cigarettes   Smokeless tobacco: Never  Substance Use Topics   Alcohol use: Yes    Comment: occasional   Drug use: Never    Family History Family History  Problem Relation Age of Onset   Diabetes Father     Review of Systems 10 systems were reviewed and are negative except as noted specifically in the HPI.  Objective   Vital signs in last 24 hours: BP 136/78   Pulse 93   Temp 99.6 F (37.6 C)   Resp 16   Wt 116.8 kg   SpO2 94%   BMI 36.42 kg/m   Physical Exam General: NAD, A&O, resting, appropriate HEENT: [place]/AT, EOMI, MMM Pulmonary: Normal work of breathing Cardiovascular: HDS, adequate peripheral perfusion Abdomen: Soft, NTTP, nondistended. GU: Normal circumcised phallus. There is significant swelling and induration of the left hemi-scrotum with a ~ 4 cm circle of necrotic skin change. There is no crepitus palpated. The right hemiscrotum is soft. He has some tenderness of his left spermatic cord but no concerning skin changes of the left inguinal cleft of left thigh. Perineum is soft and non-tender. Agree with findings - left scrotal abscess - right side and perineum  and abdomon OK Extremities: warm and well perfused Neuro: Appropriate, no focal neurological deficits  Most Recent Labs: Lab Results  Component Value Date   WBC 17.4 (H) 04/22/2021   HGB 14.6 04/22/2021   HCT 43.1 04/22/2021   PLT 253 04/22/2021    Lab Results  Component Value Date   NA 134 (L) 04/22/2021   K 4.0 04/22/2021   CL 100 04/22/2021   CO2 26 04/22/2021   BUN 11 04/22/2021   CREATININE 0.95 04/22/2021   CALCIUM 9.3 04/22/2021    Lab Results  Component Value Date   INR 1.0 08/20/2020   APTT 92 (H) 08/20/2020     IMAGING: CT ABDOMEN PELVIS W CONTRAST  Result  Date: 04/22/2021 CLINICAL DATA:  Scrotal mass or lump scrotal abscess EXAM: CT ABDOMEN AND PELVIS WITH CONTRAST TECHNIQUE: Multidetector CT imaging of the abdomen and pelvis was performed using the standard protocol following bolus administration of intravenous contrast. RADIATION DOSE REDUCTION: This exam was performed according to the departmental dose-optimization program which includes automated exposure control, adjustment of the mA and/or kV according to patient size and/or use of iterative reconstruction technique. CONTRAST:  OMNIPAQUE IOHEXOL 300 MG/ML  SOLN COMPARISON:  CT abdomen pelvis 11/10/2020 FINDINGS: Lower chest: Coronary artery calcifications.  No acute abnormality. Hepatobiliary: No focal liver abnormality. No gallstones, gallbladder wall thickening, or pericholecystic fluid. No biliary dilatation. Pancreas: No focal lesion. Normal pancreatic contour. No surrounding inflammatory changes. No main pancreatic ductal dilatation. Spleen: Normal in size without focal abnormality. Adrenals/Urinary Tract: No adrenal nodule bilaterally. Bilateral kidneys enhance symmetrically. No hydronephrosis. No hydroureter. The urinary bladder is unremarkable. Stomach/Bowel: Stomach is within normal limits. No evidence of bowel wall thickening or dilatation. Stool throughout the majority of the colon. Appendix appears  normal. Vascular/Lymphatic: Inferior vena cava filter in grossly appropriate position. All 4 distal legs are noted external to the inferior vena cava. No abdominal aorta or iliac aneurysm. Moderate atherosclerotic plaque of the aorta and its branches. No abdominal, pelvic, or inguinal lymphadenopathy. Reproductive: Prostate is unremarkable.  Bilateral small hydroceles. Other: No intraperitoneal free fluid. No intraperitoneal free gas. No organized fluid collection. Musculoskeletal: Subcutaneus soft tissue edema of the bilateral scrotum and of the penis. No subcutaneus soft tissue emphysema. No suspicious lytic or blastic osseous lesions. No acute displaced fracture. IMPRESSION: 1. No acute intra-abdominal or intrapelvic abnormality. 2. Subcutaneus soft tissue edema of the bilateral scrotum and penis. No subcutaneus soft tissue emphysema. Correlate with signs symptoms of infection/inflammation. Fournier gangrene not excluded as this is a clinical diagnosis. 3. Inferior vena cava filter. 4.  Aortic Atherosclerosis (ICD10-I70.0). Electronically Signed   By: Tish Frederickson M.D.   On: 04/22/2021 22:58   US SCROTUM W/DOPPLER  Result Date: 04/22/2021 CLINICAL DATA:  Left testicular swelling since 04/20/2021 EXAM: SCROTAL ULTRASOUND DOPPLER ULTRASOUND OF THE TESTICLES TECHNIQUE: Complete ultrasound examination of the testicles, epididymis, and other scrotal structures was performed. Color and spectral Doppler ultrasound were also utilized to evaluate blood flow to the testicles. COMPARISON:  None. FINDINGS: Right testicle Measurements: 3.7 x 3.3 x 2.9. No mass or microlithiasis visualized. Left testicle Measurements: 3.6 x 2.6 x 2.7. No mass or microlithiasis visualized. Right epididymis:  Normal in size and appearance. Left epididymis: Left epididymal tail is hypoechoic with increased vascularity. Hydrocele: Moderate complex right hydrocele containing echogenic debris Varicocele:  None visualized. Pulsed Doppler  interrogation of both testes demonstrates normal low resistance arterial and venous waveforms bilaterally. Others: There is generalized scrotal wall thickening and edema. IMPRESSION: 1.  No evidence of testicular mass or torsion. 2. Edema and increased vascular flow to left epididymal tail, concerning for epididymitis. 3. Generalized scrotal edema, which may be secondary to volume overload, clinical correlation is suggested. Electronically Signed   By: Larose Hires D.O.   On: 04/22/2021 17:20    ------  Assessment:  51 y.o. male with concern for developing Fournier's gangrene of the left hemi-scrotum   Recommendations: - Please keep patient NPO - Recommend stat Hospitalist consult for evaluation for sepsis management and post-op admission - Recommend emergent OR for incision and debridement of devitalized/infected tissue Intraop and post op sequelae discussed with long post op course; bleeding risk discussed; ICU team saw patient  Thank you for this consult. Please contact the urology consult pager with any further questions/concerns.   Reola Mosher, MD [facility] Urology Resident, [facility]

## 2021-04-23 NOTE — Anesthesia Preprocedure Evaluation (Signed)
Anesthesia Evaluation  ?Patient identified by MRN, date of birth, ID band ?Patient awake ? ? ? ?Reviewed: ?Allergy & Precautions, NPO status , Patient's Chart, lab work & pertinent test results ? ?Airway ?Mallampati: III ? ?TM Distance: >3 FB ?Neck ROM: Full ? ? ? Dental ? ?(+) Dental Advisory Given, Poor Dentition, Missing ?  ?Pulmonary ?Current Smoker and Patient abstained from smoking.,  ?  ?Pulmonary exam normal ?breath sounds clear to auscultation ? ? ? ? ? ? Cardiovascular ?hypertension, Pt. on medications ?+ angina + CAD, + Past MI, + Cardiac Stents and + DVT (s/p IVC filter)  ?Normal cardiovascular exam ?Rhythm:Regular Rate:Normal ? ? ?  ?Neuro/Psych ?negative neurological ROS ? negative psych ROS  ? GI/Hepatic ?negative GI ROS, Neg liver ROS,   ?Endo/Other  ?diabetes, Type 2, Oral Hypoglycemic Agents, Insulin DependentObesity ? ? Renal/GU ?negative Renal ROS  ? ?  ?Musculoskeletal ?negative musculoskeletal ROS ?(+)  ? Abdominal ?  ?Peds ? Hematology ? ?(+) Blood dyscrasia (Plavix), ,   ?Anesthesia Other Findings ?Day of surgery medications reviewed with the patient. ? Reproductive/Obstetrics ? ?  ? ? ? ? ? ? ? ? ? ? ? ? ? ?  ?  ? ? ? ? ? ? ? ? ?Anesthesia Physical ?Anesthesia Plan ? ?ASA: 3 and emergent ? ?Anesthesia Plan: General  ? ?Post-op Pain Management: Ofirmev IV (intra-op)*  ? ?Induction: Intravenous ? ?PONV Risk Score and Plan: 1 and Midazolam, Dexamethasone and Ondansetron ? ?Airway Management Planned: Oral ETT ? ?Additional Equipment:  ? ?Intra-op Plan:  ? ?Post-operative Plan: Extubation in OR ? ?Informed Consent: I have reviewed the patients History and Physical, chart, labs and discussed the procedure including the risks, benefits and alternatives for the proposed anesthesia with the patient or authorized representative who has indicated his/her understanding and acceptance.  ? ? ? ?Dental advisory given ? ?Plan Discussed with: CRNA ? ?Anesthesia Plan  Comments:   ? ? ? ? ? ? ?Anesthesia Quick Evaluation ? ?

## 2021-04-23 NOTE — Assessment & Plan Note (Signed)
Resume statins when able to tolerate currently unclear for how long patient will have to be n.p.o. for surgical procedures ?

## 2021-04-23 NOTE — Progress Notes (Signed)
MD notified of CBG being 447. STAT lab glucose ordered.  ?

## 2021-04-23 NOTE — Assessment & Plan Note (Signed)
Scrotal infection suspect possible development of hernia gangrene although not yet. ?Appreciate urology consult that was seen patient in ER plan to take to the OR in the nearest future to for emergent debridement ?Keep n.p.o. ?Continue vancomycin and Zosyn for now ?Patient may need recurrent procedures for debridement ?Surgical care as per urology ?

## 2021-04-23 NOTE — Assessment & Plan Note (Signed)
-  SIRS criteria met with  elevated white blood cell count,    ?   ?Component Value Date/Time  ? WBC 14.0 (H) 04/23/2021 0125  ? LYMPHSABS 2.2 04/23/2021 0125  ? ? ? tachycardia    ?Today's Vitals  ? 04/22/21 2030 04/22/21 2306 04/22/21 2315 04/22/21 2355  ?BP: (!) 148/78  136/78   ?Pulse: 93  93   ?Resp: 16  16   ?Temp:      ?TempSrc:      ?SpO2: 92%  94%   ?Weight:      ?PainSc:  6   6   ? ?Body mass index is 36.42 kg/m?. ? ?This patient meets SIRS Criteria and may be septic. ?   ?Order a lactic acid level if needed AND/OR Initiate the sepsis   ? ?The recent clinical data is shown below. ?Vitals:  ? 04/22/21 2015 04/22/21 2020 04/22/21 2030 04/22/21 2315  ?BP: (!) 153/100  (!) 148/78 136/78  ?Pulse: 100  93 93  ?Resp: _0 ?Temp:  99.6 ?F (37.6 ?C)    ?TempSrc:      ?SpO2: 95%  92% 94%  ?Weight:      ? ? -Most likely source being soft tissue infection   ? - Obtain serial lactic acid and procalcitonin level. ? - Initiated IV antibiotics in ER: ?Antibiotics Given (last 72 hours)   ? Date/Time Action Medication Dose Rate  ? 04/22/21 2010 New Bag/Given  ? vancomycin (VANCOREADY) IVPB 1750 mg/350 mL 1,750 mg 175 mL/hr  ? 04/22/21 2305 New Bag/Given  ? [MAR Hold] piperacillin-tazobactam (ZOSYN) IVPB 3.375 g (MAR Hold since Thu 04/23/2021 at 0146.Hold Reason: Transfer to a Procedural area) 3.375 g 12.5 mL/hr  ?  ? ? ?Will continue  on : Vancomycin and Zosyn for now ? ? - await results of blood and urine culture ? - Rehydrate aggressively  ?Intravenous fluids were administered  ?   ?2:00 AM ? ?

## 2021-04-23 NOTE — Assessment & Plan Note (Signed)
-   Order Sensitive  SSI     -  check TSH and HgA1C  - Hold by mouth medications*  Order  diabetes coordinator consult 

## 2021-04-23 NOTE — TOC Progression Note (Signed)
Transition of Care (TOC) - Progression Note  ? ? ?Patient Details  ?Name: Nephi Kinzel ?MRN: [id number] ?Date of Birth: March 02, 1970 ? ?Transition of Care (TOC) CM/SW Contact  ?Zenon Mayo, RN ?Phone Number: ?04/23/2021, 4:42 PM ? ?Clinical Narrative:    ? ?Transition of Care (TOC) Screening Note ? ? ?Patient Details  ?Name: Aurion Risi ?Date of Birth: 08-16-1970 ? ? ?Transition of Care (TOC) CM/SW Contact:    ?Zenon Mayo, RN ?Phone Number: ?04/23/2021, 4:42 PM ? ? ? ?Transition of Care Department [facility]) has reviewed patient and no TOC needs have been identified at this time. We will continue to monitor patient advancement through interdisciplinary progression rounds. If new patient transition needs arise, please place a TOC consult. ?  ? ? ?  ?  ? ?Expected Discharge Plan and Services ?  ?  ?  ?  ?  ?                ?  ?  ?  ?  ?  ?  ?  ?  ?  ?  ? ? ?Social Determinants of Health (SDOH) Interventions ?  ? ?Readmission Risk Interventions ?   ? View : No data to display.  ?  ?  ?  ? ? ?

## 2021-04-23 NOTE — Progress Notes (Signed)
Reviewed chart  ?WBC minimally up today ?Afebrile ?Minimal pain ?Dressing in tact and skin looks good with no extension into other soft tissue areas ?Explained usual course with patient  ?Might need second look OR ?Change dressing tomorrow noon ?'spoke with nursing ?Please continue daily labs and sepsis tx ? ?

## 2021-04-23 NOTE — Subjective & Objective (Addendum)
Presents with 1 week of scrotal abscess/infection has gotten worse over the past 24 hours he was started for this on doxycycline few days ago and has been taking it but now started to have some chills no fever though. ?Reports no prior surgeries at this site. ?Diabetes controlled with p.o. medications.  Patient states he still takes Xarelto as well as Plavix. ?No chest pain or shortness of breath. ?Reports severe pain of the scrotum ?

## 2021-04-23 NOTE — Op Note (Signed)
Preoperative diagnosis: Scrotal abscess and early Fournier's gangrene ?Postoperative diagnosis: Scrotal abscess and early Fournier's gangrene and possible epididymitis or orchitis ?Surgery: Incision and drainage of abscess of scrotum and debridement of necrotic tissue with irrigation and Foley catheter ?Surgeon: Dr. Lorin Picket Mcdiarmid ?Assistant: Dr. Reola Mosher ? ?The patient has the above diagnosis and consented the above procedure.  He was on doxycycline over the last few days.  Preoperative consult was documented.  Extra care was taken with leg positioning to minimize the risk of compartment syndrome and neuropathy and deep vein thrombosis ? ?On careful reexamination the right scrotum was normal.  Inguinal area and perineal area normal.  Abdomen normal.  Penis normal.  He had a left scrotal abscess with induration and shiny necrotic tissue draining pus. ? ?Initially cystoscoped the patient with a 23 French cystoscope.  Penile bulbar membranous urethra normal.  Prostatic urethra normal.  Urine was cloudy and sent for culture.  Bladder epithelium was normal. ? ?An incision was made approximately 9 cm in length in the middle of the abscess closer to the midline to leave scrotal skin on his left side.  Cultures were taken with the pus.  It was not foul-smelling.  Cautery and finger dissection was carried down approximately 2 cm in depth but not involving the testicle.  I developed a flap to the patient's left side that was approximately a centimeter and a half thick.  It was the appropriate plane allowing some finger dissection.  The same maneuver was done to the patient's right side and the flap was approximately 1/2 to 2 cm thick.  Some necrotic skin edges were debrided with scissors.  Circumferential mobilization cephalad and near the inferior edge of the incision was also done.   ? ?The testicle was easily identified.  I continued to mobilize with cautery and finger dissection to free up the testicle.  It almost  appeared that there was some infection involving the cord or posterior to the cord.  I did not open the cord or tunica vaginalis.  The testicle itself looked healthy.  I thought there was little bit a yellow superficially but it was not extending through the tunica vaginalis.  The question will than not the patient could have had an epididymitis but this is much less likely. ? ?Careful cautery to dry up tissues was done multiple times and checked multiple times.  Tissues were irrigated with 1 L of saline.  I triple checked and I was happy with how healthy the skin flaps were as well as the testicle and cord area.  There was no area of infection in the left. ? ?A wet Kerlix was utilized to pack the scrotum not affecting the natural lie of the testicle.  Dry dressing was applied with multiple ABD pads and 4 x 4's with a good pressure dressing with mesh pants.  There was no bleeding at the end of the case.  Foley catheter was draining well. ? ?I was pleased with the surgery and hopefully he will not require a second procedure. ?

## 2021-04-23 NOTE — Anesthesia Procedure Notes (Signed)
Procedure Name: Intubation ?Date/Time: 04/23/2021 1:53 AM ?Performed by: Edmonia Caprio, CRNA ?Pre-anesthesia Checklist: Patient identified, Emergency Drugs available, Suction available, Patient being monitored and Timeout performed ?Patient Re-evaluated:Patient Re-evaluated prior to induction ?Oxygen Delivery Method: Circle system utilized ?Preoxygenation: Pre-oxygenation with 100% oxygen ?Induction Type: IV induction and Rapid sequence ?Laryngoscope Size: Hyacinth Meeker and 2 ?Grade View: Grade II ?Tube type: Oral ?Tube size: 7.5 mm ?Number of attempts: 1 ?Airway Equipment and Method: Stylet ?Placement Confirmation: ETT inserted through vocal cords under direct vision, positive ETCO2 and breath sounds checked- equal and bilateral ?Secured at: 23 cm ?Tube secured with: Tape ?Dental Injury: Teeth and Oropharynx as per pre-operative assessment  ? ? ? ? ?

## 2021-04-23 NOTE — Assessment & Plan Note (Signed)
Known history of CAD last cardiac catheterization was in July 2022 supposed to be on Plavix for 1 year.  We will hold for tonight given need for emergent surgical procedures.  Resume when urology deems it safe ?

## 2021-04-23 NOTE — ED Provider Notes (Signed)
00:00 assumed care of patient from PA Sponseller at shift change. ? ?Please see prior provider note for full H&P.  Briefly patient is a 51 year old gentleman who presented to the emergency department and was found to have findings concerning for developing Fournier's gangrene.  Leukocytosis and hyperglycemia with normal lactic acid in terms of his laboratory assessment.  Currently receiving vancomycin and Zosyn. ? ?Patient seen by urology team, discussed w/ Dr. Janice Norrie- plan to take to the OR for debridement, admit to medicine. ? ?00:43: CONSULT: Discussed with hospital Dr. Adela Glimpse, except admission. ? ? ?Procedures  ?Marland KitchenCritical Care ?Performed by: Cherly Anderson, PA-C ?Authorized by: Cherly Anderson, PA-C  ? ?CRITICAL CARE ?Performed by: Harvie Heck ? ? ?Total critical care time: 30 minutes ? ?Critical care time was exclusive of separately billable procedures and treating other patients. ? ?Critical care was necessary to treat or prevent imminent or life-threatening deterioration. ? ?Critical care was time spent personally by me on the following activities: development of treatment plan with patient and/or surrogate as well as nursing, discussions with consultants, evaluation of patient's response to treatment, examination of patient, obtaining history from patient or surrogate, ordering and performing treatments and interventions, ordering and review of laboratory studies, ordering and review of radiographic studies, pulse oximetry and re-evaluation of patient's condition. ?  Cherly Anderson, PA-C ?04/23/21 [id number] ? ?  ?Dione Booze, MD ?04/23/21 [phone] ? ?

## 2021-04-23 NOTE — Assessment & Plan Note (Signed)
History of DVT and PE/status post IVC filter. ?Given need for surgical debridement of scrotal infection ?Hold Xarelto for tonight. ?If needs to be have recurrent procedures in OR consider bridging with heparin drip ?Resume anticoagulation when neurology deems it safe from surgical standpoint ?

## 2021-04-23 NOTE — Transfer of Care (Signed)
Immediate Anesthesia Transfer of Care Note ? ?Patient: Greg Quinn ? ?Procedure(s) Performed: INCISION AND DRAINAGE ABSCESS (Scrotum) ?CYSTOSCOPY (Urethra) ? ?Patient Location: PACU ? ?Anesthesia Type:General ? ?Level of Consciousness: awake, alert  and oriented ? ?Airway & Oxygen Therapy: Patient Spontanous Breathing ? ?Post-op Assessment: Report given to RN, Post -op Vital signs reviewed and stable and Patient moving all extremities X 4 ? ?Post vital signs: Reviewed and stable ? ?Last Vitals:  ?Vitals Value Taken Time  ?BP 166/98 04/23/21 0320  ?Temp    ?Pulse 110 04/23/21 0322  ?Resp 20 04/23/21 0322  ?SpO2 92 % 04/23/21 0322  ?Vitals shown include unvalidated device data. ? ?Last Pain:  ?Vitals:  ? 04/22/21 2355  ?TempSrc:   ?PainSc: 6   ?   ? ?  ? ?Complications: No notable events documented. ?

## 2021-04-23 NOTE — Progress Notes (Signed)
Per MD, give novolog 15 units for CBG 432. Patient eating muffin at bedside that family brought in. Education provided on diet.  ?

## 2021-04-23 NOTE — Assessment & Plan Note (Signed)
-   Spoke about importance of quitting spent 5 minutes discussing options for treatment, prior attempts at quitting, and dangers of smoking ? -At this point patient is    interested in quitting ? - order nicotine patch  ? - nursing tobacco cessation protocol ? ?

## 2021-04-23 NOTE — H&P (Signed)
? ? Greg Quinn [id number]:[id number] DOB: 11/07/1970 DOA: 04/22/2021 ?  ?  ?PCP:  Greg Solders, MD  ?Outpatient Specialists:   ?CARDS:  Dr.Croitoru ?  ? ?Patient arrived to ER on 04/22/21 at 1519 ?Referred by Attending Greg Booze, MD ? ? ?Patient coming from:   ? home Lives  With family ?  ? ?Chief Complaint:   ?Chief Complaint  ?Patient presents with  ? Abscess  ? ? ?HPI: ?Greg Quinn is a 51 y.o. male with medical history significant of bilateral PE sp IVC filter, DM2, HTN, CAD  ? ?Presented with  1 wk swelling of the scrotum ?Presents with 1 week of scrotal abscess/infection has gotten worse over the past 24 hours he was started for this on doxycycline few days ago and has been taking it but now started to have some chills no fever though. ?Reports no prior surgeries at this site. ?Diabetes controlled with p.o. medications.  Patient states he still takes Xarelto as well as Plavix. ?No chest pain or shortness of breath. ?Reports severe pain of the scrotum ?Patient smokes on occasion but denies alcohol abuse ? ? Initial COVID TEST  in house  PCR testing  Pending ? ?Lab Results  ?Component Value Date  ? SARSCOV2NAA NEGATIVE 12/26/2020  ? SARSCOV2NAA NEGATIVE 08/20/2020  ? ?  ?Regarding pertinent Chronic problems:   ? ? Hyperlipidemia -  on statins Lipitor ?Lipid Panel  ?   ?Component Value Date/Time  ? CHOL 146 08/21/2020 0207  ? TRIG 57 08/21/2020 0207  ? HDL 42 08/21/2020 0207  ? CHOLHDL 3.5 08/21/2020 0207  ? VLDL 11 08/21/2020 0207  ? LDLCALC 93 08/21/2020 0207  ? ? ? HTN on NOrvasc ?  ? ?  CAD  - On Aspirin, statin, betablocker, Plavix  ?               -  followed by cardiology ?               - last cardiac cath  ?  Mid Cx lesion is 90% stenosed. ?  Dist RCA lesion is 40% stenosed. ?  Prox LAD to Mid LAD lesion is 25% stenosed. ?  A drug-eluting stent was successfully placed using a STENT ONYX FRONTIER [id number], postdilated to 4.5 mm. ?  Post intervention, there is a 0% residual  stenosis. ?  The left ventricular systolic function is normal. ?  LV end diastolic pressure is mildly elevated. ?  The left ventricular ejection fraction is 55-65% by visual estimate. ?  There is no aortic valve stenosis. ? Recommend concurrent antiplatelet therapy of Aspirin 81 mg for 1 month and Clopidogrel 75mg  daily for 12 months.   ? ?  DM 2 -  ?Lab Results  ?Component Value Date  ? HGBA1C 8.8 (H) 08/20/2020  ? on insulin, PO meds only, diet controlled ?  ? obesity-   ?BMI Readings from Last 1 Encounters:  ?04/22/21 36.42 kg/m?  ?   ? Hx of DVT/PE on - anticoagulation with  Xarelto,  ?  ? ?While in ER: ?Clinical Course as of 04/23/21 0050  ?Wed Apr 22, 2021  ?1858 Consult with pharmacist who recommends Vanco and Zosyn as initial antibiosis for this patient's presentation.  Appreciate his collaboration of care of this patient. [RS]  ?2340 Consult to urology resident Dr. [id number], who is going to evaluate the patient at the bedside due to clinical and diagnostic concern for Fournier's gangrene. I appreciate his collaboration in the care  of this patient.  [RS]  ?  ?Clinical Course User Index ?[RS] Sponseller, Eugene Gaviaebekah R, PA-C  ?  ? ?Ordered ?US scrotum IMPRESSION: ?1.  No evidence of testicular mass or torsion. ?  ?2. Edema and increased vascular flow to left epididymal tail, ?concerning for epididymitis. ?  ?3. Generalized scrotal edema, which may be secondary to volume ?overload, clinical correlation is suggested. ?   ?CTabd/pelvis -  1. No acute intra-abdominal or intrapelvic abnormality. ?2. Subcutaneus soft tissue edema of the bilateral scrotum and penis. ?No subcutaneus soft tissue emphysema. Correlate with signs symptoms ?of infection/inflammation. Fournier gangrene not excluded as this is ?a clinical diagnosis. ?3. Inferior vena cava filter. ?4.  Aortic Atherosclerosis ?  ?Following Medications were ordered in ER: ?Medications  ?piperacillin-tazobactam (ZOSYN) IVPB 3.375 g (3.375 g Intravenous New Bag/Given  04/22/21 2305)  ?vancomycin (VANCOREADY) IVPB 1250 mg/250 mL (has no administration in time range)  ?ondansetron [facility](ZOFRAN) injection 4 mg (4 mg Intravenous Given 04/22/21 1909)  ?morphine (PF) 4 MG/ML injection 4 mg (4 mg Intravenous Given 04/22/21 1909)  ?vancomycin (VANCOREADY) IVPB 1750 mg/350 mL (0 mg Intravenous Stopped 04/22/21 2300)  ?iohexol (OMNIPAQUE) 300 MG/ML solution 100 mL (100 mLs Intravenous Contrast Given 04/22/21 2108)  ?acetaminophen (TYLENOL) tablet 650 mg (650 mg Oral Given 04/22/21 2143)  ?  ?_______________________________________________________ ?ER Provider Called:  Urology    Dr.McDermid ?They Recommend admit to medicine   ? SEEN in ER ?  ?ED Triage Vitals  ?Enc Vitals Group  ?   BP 04/22/21 1542 (!) 152/85  ?   Pulse Rate 04/22/21 1542 (!) 105  ?   Resp 04/22/21 1542 16  ?   Temp 04/22/21 1542 99.1 ?F (37.3 ?C)  ?   Temp Source 04/22/21 1542 Oral  ?   SpO2 04/22/21 1542 96 %  ?   Weight 04/22/21 1900 257 lb 8 oz (116.8 kg)  ?   Height --   ?   Head Circumference --   ?   Peak Flow --   ?   Pain Score 04/22/21 1552 9  ?   Pain Loc --   ?   Pain Edu? --   ?   Excl. in GC? --   ?VHQI(69)@TMAX(24)@    ? _________________________________________ ?Significant initial  Findings: ?Abnormal Labs Reviewed  ?COMPREHENSIVE METABOLIC PANEL - Abnormal; Notable for the following components:  ?    Result Value  ? Sodium 134 (*)   ? Glucose, Bld 289 (*)   ? Albumin 3.3 (*)   ? All other components within normal limits  ?CBC WITH DIFFERENTIAL/PLATELET - Abnormal; Notable for the following components:  ? WBC 17.4 (*)   ? Neutro Abs 14.2 (*)   ? Monocytes Absolute 1.5 (*)   ? All other components within normal limits  ?URINALYSIS, ROUTINE W REFLEX MICROSCOPIC - Abnormal; Notable for the following components:  ? Specific Gravity, Urine >1.030 (*)   ? Glucose, UA >=500 (*)   ? Hgb urine dipstick TRACE (*)   ? Ketones, ur 15 (*)   ? Protein, ur 30 (*)   ? All other components within normal limits  ?URINALYSIS, MICROSCOPIC  (REFLEX) - Abnormal; Notable for the following components:  ? Bacteria, UA RARE (*)   ? All other components within normal limits  ?  ?ECG: Ordered ?Personally reviewed by me showing: ?HR : 93 ?Rhythm:Right axis deviation ?Probable anteroseptal infarct, old ?Minimal ST elevation, inferior leads ?When compared with ECG of 11/09/2020, ?No significant change was found ?  QTC 439 ?  ?____________________ ?This patient meets SIRS Criteria and may be septic.  ?The recent clinical data is shown below. ?Vitals:  ? 04/22/21 2015 04/22/21 2020 04/22/21 2030 04/22/21 2315  ?BP: (!) 153/100  (!) 148/78 136/78  ?Pulse: 100  93 93  ?Resp: 18  16 16   ?Temp:  99.6 ?F (37.6 ?C)    ?TempSrc:      ?SpO2: 95%  92% 94%  ?Weight:      ?  ?WBC ? ?   ?Component Value Date/Time  ? WBC 17.4 (H) 04/22/2021 1808  ? LYMPHSABS 1.6 04/22/2021 1808  ? MONOABS 1.5 (H) 04/22/2021 1808  ? EOSABS 0.0 04/22/2021 1808  ? BASOSABS 0.0 04/22/2021 1808  ?  ?Lactic Acid, Venous ?   ?Component Value Date/Time  ? LATICACIDVEN 1.3 04/22/2021 1902  ?  ?Procalcitonin   Ordered  ?  ? UA   no evidence of UTI   ?  ?Urine analysis: ?   ?Component Value Date/Time  ? COLORURINE YELLOW 04/22/2021 1925  ? APPEARANCEUR CLEAR 04/22/2021 1925  ? LABSPEC >1.030 (H) 04/22/2021 1925  ? PHURINE 6.0 04/22/2021 1925  ? GLUCOSEU >=500 (A) 04/22/2021 1925  ? HGBUR TRACE (A) 04/22/2021 1925  ? BILIRUBINUR NEGATIVE 04/22/2021 1925  ? KETONESUR 15 (A) 04/22/2021 1925  ? PROTEINUR 30 (A) 04/22/2021 1925  ? NITRITE NEGATIVE 04/22/2021 1925  ? LEUKOCYTESUR NEGATIVE 04/22/2021 1925  ?   ?_______________________________________________ ?Hospitalist was called for admission for Scrotal infection ? ? ?The following Work up has been ordered so far: ? ?Orders Placed This Encounter  ?Procedures  ? Procedural/ Surgical Case Request: INCISION AND DRAINAGE ABSCESS  ? Resp Panel by RT-PCR (Flu A&B, Covid) Nasopharyngeal Swab  ? 04/24/2021 SCROTUM W/DOPPLER  ? CT ABDOMEN PELVIS W CONTRAST  ? Comprehensive  metabolic panel  ? CBC with Differential  ? Lactic acid, plasma  ? Urinalysis, Routine w reflex microscopic Urine, Clean Catch  ? Urinalysis, Microscopic (reflex)  ? Diet NPO time specified  ? vancomycin per pharmacy

## 2021-04-23 NOTE — Assessment & Plan Note (Signed)
Resume home medications when able to tolerate 

## 2021-04-24 ENCOUNTER — Encounter (HOSPITAL_COMMUNITY): Payer: Self-pay | Admitting: Urology

## 2021-04-24 DIAGNOSIS — A419 Sepsis, unspecified organism: Secondary | ICD-10-CM | POA: Diagnosis not present

## 2021-04-24 LAB — URINE CULTURE: Culture: NO GROWTH

## 2021-04-24 LAB — BASIC METABOLIC PANEL
Anion gap: 9 (ref 5–15)
BUN: 11 mg/dL (ref 6–20)
CO2: 23 mmol/L (ref 22–32)
Calcium: 8.5 mg/dL — ABNORMAL LOW (ref 8.9–10.3)
Chloride: 100 mmol/L (ref 98–111)
Creatinine, Ser: 0.81 mg/dL (ref 0.61–1.24)
GFR, Estimated: >60 mL/min (ref >60)
Glucose, Bld: 309 mg/dL — ABNORMAL HIGH (ref 70–99)
Potassium: 4.5 mmol/L (ref 3.5–5.1)
Sodium: 132 mmol/L — ABNORMAL LOW (ref 135–145)

## 2021-04-24 LAB — CBC WITH DIFFERENTIAL/PLATELET
Abs Immature Granulocytes: 0.12 10*3/uL — ABNORMAL HIGH (ref 0.00–0.07)
Basophils Absolute: 0 10*3/uL (ref 0.0–0.1)
Basophils Relative: 0 %
Eosinophils Absolute: 0 10*3/uL (ref 0.0–0.5)
Eosinophils Relative: 0 %
HCT: 36.5 % — ABNORMAL LOW (ref 39.0–52.0)
Hemoglobin: 12.6 g/dL — ABNORMAL LOW (ref 13.0–17.0)
Immature Granulocytes: 1 %
Lymphocytes Relative: 10 %
Lymphs Abs: 1.7 10*3/uL (ref 0.7–4.0)
MCH: 29.9 pg (ref 26.0–34.0)
MCHC: 34.5 g/dL (ref 30.0–36.0)
MCV: 86.7 fL (ref 80.0–100.0)
Monocytes Absolute: 1.2 10*3/uL — ABNORMAL HIGH (ref 0.1–1.0)
Monocytes Relative: 7 %
Neutro Abs: 13.9 10*3/uL — ABNORMAL HIGH (ref 1.7–7.7)
Neutrophils Relative %: 82 %
Platelets: 246 10*3/uL (ref 150–400)
RBC: 4.21 MIL/uL — ABNORMAL LOW (ref 4.22–5.81)
RDW: 12.2 % (ref 11.5–15.5)
WBC: 17 10*3/uL — ABNORMAL HIGH (ref 4.0–10.5)
nRBC: 0 % (ref 0.0–0.2)

## 2021-04-24 LAB — GLUCOSE, CAPILLARY
Glucose-Capillary: 247 mg/dL — ABNORMAL HIGH (ref 70–99)
Glucose-Capillary: 251 mg/dL — ABNORMAL HIGH (ref 70–99)
Glucose-Capillary: 264 mg/dL — ABNORMAL HIGH (ref 70–99)
Glucose-Capillary: 341 mg/dL — ABNORMAL HIGH (ref 70–99)
Glucose-Capillary: 344 mg/dL — ABNORMAL HIGH (ref 70–99)
Glucose-Capillary: 406 mg/dL — ABNORMAL HIGH (ref 70–99)

## 2021-04-24 LAB — SURGICAL PATHOLOGY

## 2021-04-24 MED ORDER — INSULIN GLARGINE-YFGN 100 UNIT/ML ~~LOC~~ SOLN: 20.0000 [IU] | Freq: Every day | SUBCUTANEOUS | Status: DC

## 2021-04-24 MED ORDER — INSULIN GLARGINE-YFGN 100 UNIT/ML ~~LOC~~ SOLN
25.0000 [IU] | Freq: Every day | SUBCUTANEOUS | Status: DC
  Administered 2021-04-25 – 2021-04-26 (×2): 25 [IU] via SUBCUTANEOUS
  Filled 2021-04-24 (×2): qty 0.25

## 2021-04-24 MED ORDER — INSULIN GLARGINE-YFGN 100 UNIT/ML ~~LOC~~ SOLN
10.0000 [IU] | Freq: Once | SUBCUTANEOUS | Status: DC
  Filled 2021-04-24: qty 0.1

## 2021-04-24 MED ORDER — SENNOSIDES-DOCUSATE SODIUM 8.6-50 MG PO TABS
2.0000 | ORAL_TABLET | Freq: Two times a day (BID) | ORAL | Status: DC
  Administered 2021-04-24 – 2021-04-27 (×4): 2 via ORAL
  Filled 2021-04-24 (×11): qty 2

## 2021-04-24 MED ORDER — INSULIN ASPART 100 UNIT/ML IJ SOLN
8.0000 [IU] | Freq: Once | INTRAMUSCULAR | Status: AC
  Administered 2021-04-24: 8 [IU] via SUBCUTANEOUS

## 2021-04-24 MED ORDER — HYDROCODONE-ACETAMINOPHEN 5-325 MG PO TABS
2.0000 | ORAL_TABLET | Freq: Three times a day (TID) | ORAL | Status: DC | PRN
  Administered 2021-04-24 – 2021-05-01 (×18): 2 via ORAL
  Filled 2021-04-24 (×19): qty 2

## 2021-04-24 MED ORDER — PHENOL 1.4 % MT LIQD
1.0000 | OROMUCOSAL | Status: DC | PRN
  Administered 2021-04-24: 1 via OROMUCOSAL
  Filled 2021-04-24: qty 177

## 2021-04-24 MED ORDER — INSULIN GLARGINE-YFGN 100 UNIT/ML ~~LOC~~ SOLN
5.0000 [IU] | SUBCUTANEOUS | Status: AC
  Administered 2021-04-24: 5 [IU] via SUBCUTANEOUS
  Filled 2021-04-24: qty 0.05

## 2021-04-24 NOTE — Progress Notes (Signed)
Feels good  ?Sore throat ?Ambulating ?Still needs pain meds ?Changed dressing with morphine 2 mg iv- very well tolerated ?No signs of infection or necrosis of skin and deep tissue ?Testicle normal ?Skin flaps thin but soft and mobile ?No foul smell ?Irrigated with nacl and place more superficial wet to dry dressing ?Continue with foley and daily dressing changes ?LOCAL FINDING DO NOT EXPLAIN WBC ELEVATION ? ?

## 2021-04-24 NOTE — Progress Notes (Signed)
?PROGRESS NOTE ? ?[employer]  ?DOB: 04/24/1970  ?PCP: Pcp, No ?[id number]  ?DOA: 04/22/2021 ? LOS: 1 day  ?Hospital Day: 3 ? ?Brief narrative: ?Greg Quinn is a 51 y.o. male with PMH significant for poorly controlled diabetes, HTN, HLD, CAD/NSTEMI on Plavix, DVT/PE on Xarelto, s/p IVC filter, current everyday smoker. ?Patient presented to the ED on 3/29 with 1 week of scrotal infection/abscess which progressively worsened over the last 24 hours ?As an outpatient, patient was tried on oral doxycycline without success.  No fever but endorses chills ? ?In the ED, patient was slightly tachycardic to 100s, blood pressure in 150s ?Labs with WC count elevated to 17.4, blood glucose level elevated to 89 ?CT abdomen pelvis showed subcutaneous soft tissue edema of the bilateral scrotum and penis, no evidence of gas.  Unable to rule out Fournier's gangrene. ?Admitted to hospital service ?Urology was consulted  ? ? ?Subjective: ?Patient was seen and examined at bedside.  He reports pain in his scrotum and penis post I&D from early Fournier's gangrene.  Plan for dressing changes today.  States pain management is not working at this time, increased doses of opiates. ? ?Principal Problem: ?  Sepsis (HCC) ?Active Problems: ?  DM (diabetes mellitus), type 2 with complications (HCC) ?  Hypertension ?  Hyperlipidemia LDL goal <70 ?  DVT, lower extremity (HCC) ?  CAD S/P percutaneous coronary angioplasty ?  Scrotal infection ?  Tobacco abuse ?  ? ?Assessment and Plan: ?Scrotal infection/abscess ?Early Fournier's gangrene ?-Seen by urologist.  Lottie Rater emergent I&D of scrotum and debridement of necrotic tissue with irrigation and Foley catheter. ?-Wound culture was sent. ?-Currently on IV vancomycin and IV Zosyn. ?-Continue IV hydration with NS at 100 mill per hour at this time ?-Continue monitor WBC count ?-Pain control with Norco, dose increased.  Bowel regimen added. ?Recent Labs  ?Lab 04/22/21 ?1808  04/22/21 ?1902 04/23/21 ?0125 04/23/21 ?4401 04/24/21 ?0272  ?WBC 17.4*  --  14.0* 15.2* 17.0*  ?LATICACIDVEN 1.0 1.3  --   --   --   ?PROCALCITON  --   --  0.16  --   --   ? ?Uncontrolled type 2 diabetes mellitus with hyperglycemia ?-A1c 10.7 on 04/23/2021 ?-Home meds include metformin 750 mg daily ?-Currently on sliding scale insulin with Accu-Cheks.  Blood sugar level over 200.  Start on Semglee 10 units from this morning, increased to 20 unit daily.  Continue insulin sliding scale. ?Appreciate diabetes coordinator's assistance. ? ?CAD s/p stent ?Hyperlipidemia ?-Patient underwent left heart cath and stent placement on July 2022 ?-PTA on aspirin 81 mg daily and Plavix 75 mg daily, Lipitor 80 mg daily ?-Currently both are on hold as patient may need to be back to the OR again..  Continue statin. ? ?Essential hypertension ?-PTA, on lisinopril 20 mg daily ?Continue lisinopril ? ?History of recurrent DVT/PE  ?s/p IVC filter ?-PTA, patient was on Xarelto which is currently on hold. ?-Plan to resume once okay with urology. ?-Patient says he is allergic to heparin products.  He could not specify but he states his platelet level dropped with it presumably HIT. ? ?chronic daily smoker ?-Counseled to quit. ?-Nicotine patch offered. ? ?Goals of care ?  Code Status: Full Code  ? ? ?Mobility: Encourage ambulation. ? ?Nutritional status:  ?Body mass index is 39.29 kg/m?.  ?  ?  ? ? ? ? ?Diet:  ?Diet Order   ? ?       ?  Diet Carb Modified  Fluid consistency: Thin; Room service appropriate? Yes  Diet effective now       ?  ? ?  ?  ? ?  ? ? ?DVT prophylaxis:  ?SCDs Start: 04/23/21 0427 ?  ?Antimicrobials: Currently on IV Zosyn and vancomycin ?Fluid: NS at 100 mill per hour ?Consultants: Urology ?Family Communication: None at bedside ? ?Status is: Inpatient ? ?Continue in-hospital care because: POD 0 ?Level of care: Progressive  ? ?Dispo: The patient is from: Home ?             Anticipated d/c is to: Pending clinical course ?              Patient currently is not medically stable to d/c. ?  Difficult to place patient No ? ? ? ? ?Infusions:  ? sodium chloride 100 mL/hr at 04/24/21 1105  ? sodium chloride 10 mL/hr at 04/23/21 0941  ? piperacillin-tazobactam (ZOSYN)  IV 3.375 g (04/24/21 0544)  ? vancomycin 1,250 mg (04/24/21 0545)  ? ? ?Scheduled Meds: ? atorvastatin  80 mg Oral QHS  ? Chlorhexidine Gluconate Cloth  6 each Topical Daily  ? insulin aspart  0-15 Units Subcutaneous TID WC  ? [START ON 04/25/2021] insulin glargine-yfgn  20 Units Subcutaneous Daily  ? lisinopril  20 mg Oral Daily  ? nicotine  7 mg Transdermal Daily  ? ? ?PRN meds: ?sodium chloride, acetaminophen **OR** acetaminophen, albuterol, HYDROcodone-acetaminophen, morphine injection  ? ?Antimicrobials: ?Anti-infectives (From admission, onward)  ? ? Start     Dose/Rate Route Frequency Ordered Stop  ? 04/23/21 1800  vancomycin (VANCOREADY) IVPB 1250 mg/250 mL       ? 1,250 mg ?166.7 mL/hr over 90 Minutes Intravenous Every 12 hours 04/23/21 0950    ? 04/23/21 0400  vancomycin (VANCOREADY) IVPB 1250 mg/250 mL  Status:  Discontinued       ? 1,250 mg ?166.7 mL/hr over 90 Minutes Intravenous Every 8 hours 04/22/21 1926 04/23/21 0950  ? 04/22/21 2200  piperacillin-tazobactam (ZOSYN) IVPB 3.375 g       ? 3.375 g ?12.5 mL/hr over 240 Minutes Intravenous Every 8 hours 04/22/21 1859    ? 04/22/21 1915  vancomycin (VANCOREADY) IVPB 1750 mg/350 mL       ? 1,750 mg ?175 mL/hr over 120 Minutes Intravenous  Once 04/22/21 1913 04/22/21 2300  ? ?  ? ? ?Objective: ?Vitals:  ? 04/24/21 0817 04/24/21 1127  ?BP: 123/76 128/77  ?Pulse: 92 79  ?Resp: 20 17  ?Temp: 97.8 ?F (36.6 ?C) 98.1 ?F (36.7 ?C)  ?SpO2: 96% 97%  ? ? ?Intake/Output Summary (Last 24 hours) at 04/24/2021 1410 ?Last data filed at 04/24/2021 1301 ?Gross per 24 hour  ?Intake 3648.34 ml  ?Output 5476 ml  ?Net -1827.66 ml  ? ?Filed Weights  ? 04/22/21 1900 04/23/21 0430 04/24/21 0038  ?Weight: 116.8 kg 123.5 kg 126 kg  ? ?Weight change: 9.2  kg ?Body mass index is 39.29 kg/m?.  ? ?Physical Exam: ?General exam: Well-developed well-nourished in no acute stress.   ?Skin: Scrotal lesion from early Fournier gangrene ?HEENT: Atraumatic, normocephalic, no obvious bleeding ?Lungs: Clear to auscultation no wheeze or rales ?CVS: Regular rate and rhythm ?GI/Abd soft nontender normal bowel sounds. ?CNS: Alert, awake, oriented x3 ?Psychiatry: Mood is appropriate ?Extremities: No lower extremity edema bilaterally. ? ?Data Review: I have personally reviewed the laboratory data and studies available. ? ?F/u labs ordered ?Unresulted Labs (From admission, onward)  ? ?  Start     Ordered  ?  04/25/21 0800  Vancomycin, peak  Once-Timed,   TIMED       ?Comments: Please draw 2-3 hours after vancomycin dose ?  ? 04/24/21 1149  ? 04/25/21 0400  Vancomycin, trough  Once-Timed,   TIMED       ?See Hyperspace for full Linked Orders Report.  ? 04/24/21 1149  ? 04/24/21 0500  CBC with Differential/Platelet  Daily,   R     ? 04/23/21 1237  ? 04/24/21 0500  Basic metabolic panel  Daily,   R     ? 04/23/21 1237  ? ?  ?  ? ?  ? ? ?Signed, ?Darlin Drop, MD ?[facility] ?04/24/2021 ? ? ? ? ? ? ? ? ? ? ? ? ?

## 2021-04-24 NOTE — Progress Notes (Signed)
Spoke with Urology. Was informed provider will be at bedside in 15 minutes to change dressing.  ?

## 2021-04-24 NOTE — TOC Progression Note (Signed)
Transition of Care (TOC) - Progression Note  ? ? ?Patient Details  ?Name: Greg Quinn ?MRN: [id number] ?Date of Birth: 1970/01/31 ? ?Transition of Care (TOC) CM/SW Contact  ?Zenon Mayo, RN ?Phone Number: ?04/24/2021, 3:00 PM ? ?Clinical Narrative:    ? ?from home, indep, with  Fournier's gangrene, I and D done, conts on iv abx and ivf. TOC will continue to follow for dc needs. ? ?  ?  ? ?Expected Discharge Plan and Services ?  ?  ?  ?  ?  ?                ?  ?  ?  ?  ?  ?  ?  ?  ?  ?  ? ? ?Social Determinants of Health (SDOH) Interventions ?  ? ?Readmission Risk Interventions ?   ? View : No data to display.  ?  ?  ?  ? ? ?

## 2021-04-24 NOTE — Progress Notes (Addendum)
Inpatient Diabetes Program Recommendations ? ?AACE/ADA: New Consensus Statement on Inpatient Glycemic Control (2015) ? ?Target Ranges:  Prepandial:   less than 140 mg/dL ?     Peak postprandial:   less than 180 mg/dL (1-2 hours) ?     Critically ill patients:  140 - 180 mg/dL  ? ?Lab Results  ?Component Value Date  ? GLUCAP 247 (H) 04/24/2021  ? HGBA1C 10.7 (H) 04/23/2021  ? ? ?Review of Glycemic Control ? ?Diabetes history: type 2 ?Outpatient Diabetes medications: Metformin 750 mg daily, Glucotrol 5 mg daily, Rybelsus 14 mg daily ?Current orders for Inpatient glycemic control: Semglee 20 units daily, Novolog MODERATE correction scale TID ? ?Inpatient Diabetes Program Recommendations:   ?Spoke with patient in regards to his diabetes. He was diagnosed in 2011. Patient had stopped his diabetes medications for the last 60 days. Stated that he had changed his diet in the last month to eating much healthier, less carbs. Is determined to change his health habits so that he will be "healthier".  ? ?He has a very positive attitude for the future with his care. He will need prescription for home blood glucose meter kit at discharge. (Order #[id number]) ? ? ?Will continue to monitor blood sugars while in the hospital.  ? ?Harvel Ricks RN BSN CDE ?Diabetes Coordinator ?Pager: [phone]  8am-5pm  ? ? ?

## 2021-04-24 NOTE — Plan of Care (Signed)
?  Problem: Pain Managment: ?Goal: General experience of comfort will improve ?Outcome: Completed/Met ?  ?Problem: Clinical Measurements: ?Goal: Respiratory complications will improve ?Outcome: Completed/Met ?  ?

## 2021-04-25 DIAGNOSIS — A419 Sepsis, unspecified organism: Secondary | ICD-10-CM | POA: Diagnosis not present

## 2021-04-25 LAB — BASIC METABOLIC PANEL
Anion gap: 8 (ref 5–15)
BUN: 10 mg/dL (ref 6–20)
CO2: 26 mmol/L (ref 22–32)
Calcium: 8.7 mg/dL — ABNORMAL LOW (ref 8.9–10.3)
Chloride: 103 mmol/L (ref 98–111)
Creatinine, Ser: 0.83 mg/dL (ref 0.61–1.24)
GFR, Estimated: >60 mL/min (ref >60)
Glucose, Bld: 235 mg/dL — ABNORMAL HIGH (ref 70–99)
Potassium: 3.9 mmol/L (ref 3.5–5.1)
Sodium: 137 mmol/L (ref 135–145)

## 2021-04-25 LAB — GLUCOSE, CAPILLARY
Glucose-Capillary: 245 mg/dL — ABNORMAL HIGH (ref 70–99)
Glucose-Capillary: 240 mg/dL — ABNORMAL HIGH (ref 70–99)
Glucose-Capillary: 277 mg/dL — ABNORMAL HIGH (ref 70–99)
Glucose-Capillary: 305 mg/dL — ABNORMAL HIGH (ref 70–99)

## 2021-04-25 LAB — CBC WITH DIFFERENTIAL/PLATELET
Abs Immature Granulocytes: 0 10*3/uL (ref 0.00–0.07)
Basophils Absolute: 0 10*3/uL (ref 0.0–0.1)
Basophils Relative: 0 %
Eosinophils Absolute: 0.2 10*3/uL (ref 0.0–0.5)
Eosinophils Relative: 2 %
HCT: 39.1 % (ref 39.0–52.0)
Hemoglobin: 13.1 g/dL (ref 13.0–17.0)
Lymphocytes Relative: 28 %
Lymphs Abs: 3 10*3/uL (ref 0.7–4.0)
MCH: 29.5 pg (ref 26.0–34.0)
MCHC: 33.5 g/dL (ref 30.0–36.0)
MCV: 88.1 fL (ref 80.0–100.0)
Monocytes Absolute: 0.5 10*3/uL (ref 0.1–1.0)
Monocytes Relative: 5 %
Neutro Abs: 7 10*3/uL (ref 1.7–7.7)
Neutrophils Relative %: 65 %
Platelets: 287 10*3/uL (ref 150–400)
RBC: 4.44 MIL/uL (ref 4.22–5.81)
RDW: 12.5 % (ref 11.5–15.5)
WBC: 10.8 10*3/uL — ABNORMAL HIGH (ref 4.0–10.5)
nRBC: 0 % (ref 0.0–0.2)
nRBC: 1 /100 WBC — ABNORMAL HIGH

## 2021-04-25 LAB — URINE CULTURE: Culture: 500 — AB

## 2021-04-25 LAB — VANCOMYCIN, TROUGH: Vancomycin Tr: 9 ug/mL — ABNORMAL LOW (ref 15–20)

## 2021-04-25 LAB — VANCOMYCIN, PEAK: Vancomycin Pk: 20 ug/mL — ABNORMAL LOW (ref 30–40)

## 2021-04-25 MED ORDER — VANCOMYCIN HCL 1500 MG/300ML IV SOLN
1500.0000 mg | Freq: Two times a day (BID) | INTRAVENOUS | Status: DC
  Administered 2021-04-25 – 2021-04-26 (×2): 1500 mg via INTRAVENOUS
  Filled 2021-04-25 (×2): qty 300

## 2021-04-25 NOTE — Progress Notes (Signed)
2 Days Post-Op  ? ?Subjective/Chief Complaint: ? ?1 - Severe Scrotal Abscess / Fourneir's Gangrene - s/p OR I+D by MacDiarmid 3/30 for early necrotizing infection. [place] strep/GRN - pending, A1c10. Placed on empiric Vanc+Zosyn. Now wound being managed with daily wet to dry dressings. ? ?Today " Greg Quinn " is improving objectively. WBC trending down, no fevers.  ? ? ?Objective: ?Vital signs in last 24 hours: ?Temp:  [97.8 ?F (36.6 ?C)-98.4 ?F (36.9 ?C)] 98.2 ?F (36.8 ?C) (04/01 [id number]) ?Pulse Rate:  [73-92] 83 (04/01 0433) ?Resp:  [17-20] 19 (04/01 0433) ?BP: (110-153)/(76-90) 133/90 (04/01 0433) ?SpO2:  [92 %-97 %] 92 % (04/01 0433) ?Weight:  [126.2 kg] 126.2 kg (04/01 0433) ?Last BM Date : 04/24/21 ? ?Intake/Output from previous day: ?03/31 0701 - 04/01 0700 ?In: 3672.3 [P.O.:653; I.V.:2629.7; IV Piggyback:389.6] ?Out: 4325 [Urine:4325] ?Intake/Output this shift: ?No intake/output data recorded. ? ?NAD, talking on phone, in good spirits ?Non-labored breathing on RA ?RRR ?Very obese abdomen ?We to dry dressing in place, removed and wound very clean based, no resudual purlulence / fluctence. Lt. Testis exposed but pink / viable. No surrounding crepitus.  ? ? ?Lab Results:  ?Recent Labs  ?  04/24/21 ?0356 04/25/21 ?0353  ?WBC 17.0* 10.8*  ?HGB 12.6* 13.1  ?HCT 36.5* 39.1  ?PLT 246 287  ? ?BMET ?Recent Labs  ?  04/24/21 ?0356 04/25/21 ?0353  ?NA 132* 137  ?K 4.5 3.9  ?CL 100 103  ?CO2 23 26  ?GLUCOSE 309* 235*  ?BUN 11 10  ?CREATININE 0.81 0.83  ?CALCIUM 8.5* 8.7*  ? ?PT/INR ?No results for input(s): LABPROT, INR in the last 72 hours. ?ABG ?No results for input(s): PHART, HCO3 in the last 72 hours. ? ?Invalid input(s): PCO2, PO2 ? ?Studies/Results: ?No results found. ? ?Anti-infectives: ?Anti-infectives (From admission, onward)  ? ? Start     Dose/Rate Route Frequency Ordered Stop  ? 04/23/21 1800  vancomycin (VANCOREADY) IVPB 1250 mg/250 mL       ? 1,250 mg ?166.7 mL/hr over 90 Minutes Intravenous Every 12 hours 04/23/21  0950    ? 04/23/21 0400  vancomycin (VANCOREADY) IVPB 1250 mg/250 mL  Status:  Discontinued       ? 1,250 mg ?166.7 mL/hr over 90 Minutes Intravenous Every 8 hours 04/22/21 1926 04/23/21 0950  ? 04/22/21 2200  piperacillin-tazobactam (ZOSYN) IVPB 3.375 g       ? 3.375 g ?12.5 mL/hr over 240 Minutes Intravenous Every 8 hours 04/22/21 1859    ? 04/22/21 1915  vancomycin (VANCOREADY) IVPB 1750 mg/350 mL       ? 1,750 mg ?175 mL/hr over 120 Minutes Intravenous  Once 04/22/21 1913 04/22/21 2300  ? ?  ? ? ?Assessment/Plan: ? ? ?1 - Severe Scrotal Abscess / Fourneir's Gangrene  - imporving clinically. Can likely narrow ABX soon Unasyn only with plan for augmention or similar PO for bridging to discharge. Pt has very good understnading of management with wet to dry dressings and that time course for complete healing will be weeks. He is motivated to learn and participate. I do not feel further operative debridement will be necessary. We may consider additional procedure for partial wound closure to hasten wound management.  ? ?Will follow. Greatly appreciate hospitalsit team comanagement, especially of his severe DM.  ? ? ?Alexis Frock ?04/25/2021 ? ?

## 2021-04-25 NOTE — Progress Notes (Signed)
? ?PROGRESS NOTE ? ? ? ?[employer]  ?[id number]:[id number]  ?DOB: 09-13-1970  ?DOA: 04/22/2021 ?PCP: Pcp, No ?Outpatient Specialists: ? ? ?Hospital course: ?51 year old man with poorly controlled diabetes, HTN, HLD, CAD/NSTEMI on Plavix, DVT/PE on Xarelto, s/p IVC filter, current everyday smoker was admitted 04/22/2021 with Fournier's gangrene.  He has been treated by urology with intraoperative I&D and vancomycin and Zosyn. ? ? ?Subjective: ?Patient states he is generally doing well.  His main concern is significant pain with dressing changes occurring twice a day.  No other complaints. ? ? ?Objective: ?Vitals:  ? 04/25/21 0015 04/25/21 0433 04/25/21 0803 04/25/21 1026  ?BP: 110/79 133/90 (!) 142/90 137/81  ?Pulse: 73 83 84 75  ?Resp: 20 19 19 18   ?Temp: 98.4 ?F (36.9 ?C) 98.2 ?F (36.8 ?C) 98.2 ?F (36.8 ?C) 98 ?F (36.7 ?C)  ?TempSrc: Oral Oral Oral Oral  ?SpO2: 92% 92% 99% 92%  ?Weight:  126.2 kg    ?Height:      ? ? ?Intake/Output Summary (Last 24 hours) at 04/25/2021 1447 ?Last data filed at 04/25/2021 1322 ?Gross per 24 hour  ?Intake 3499.29 ml  ?Output 4776 ml  ?Net -1276.71 ml  ? ?Filed Weights  ? 04/23/21 0430 04/24/21 0038 04/25/21 0433  ?Weight: 123.5 kg 126 kg 126.2 kg  ? ? ? ?Exam: ? ?General: Obese man in good spirits lying in bed in NAD ?Eyes: sclera anicteric, conjuctiva mild injection bilaterally ?CVS: S1-S2, regular  ?Respiratory: Reasonable air entry  ?GI/back: NABS, soft, NT, wound VAC in place ?LE: No edema.  ?Neuro: grossly nonfocal.  ?Psych: patient is logical and coherent, judgement and insight appear normal, mood and affect appropriate to situation. ? ? ?Assessment & Plan: ?  ?Fournier's gangrene ?S/p intraoperative I&D and debridement on 04/23/2021 ?Continue vancomycin and Zosyn ?Leukocytosis improving ?Order entered for nurses to pretreat with IV morphine prior to dressing changes ? ?IDDM ?Blood sugars remain markedly elevated at 250-350 ?Patient is being followed by diabetes  coordinator ?Glargine increased to 25 units today ?Follow fingersticks and increase glargine tomorrow as warranted ? ? ?CAD ?No active disease at present ?Continue Lipitor ?Aspirin and Plavix are on hold given possible need for ongoing I&D/debridement ? ?HTN ?Continue lisinopril ? ?h/o VTE/PE ?S/p IVC filter ?Xarelto was on hold ?Apparently allergic to heparin products possibly HIT ?Xarelto can be restarted upon discharge ? ? ? ?DVT prophylaxis: SCD ?Code Status: Full ?Family Communication: None at bedside, patient states not necessary to call ?Disposition Plan:  ? Patient is from: Home ? Anticipated Discharge Location: TBD ? Barriers to Discharge: Ongoing infection ? Is patient medically stable for Discharge: No ? ? ?Scheduled Meds: ? atorvastatin  80 mg Oral QHS  ? Chlorhexidine Gluconate Cloth  6 each Topical Daily  ? insulin aspart  0-15 Units Subcutaneous TID WC  ? insulin glargine-yfgn  25 Units Subcutaneous Daily  ? lisinopril  20 mg Oral Daily  ? nicotine  7 mg Transdermal Daily  ? senna-docusate  2 tablet Oral BID  ? ?Continuous Infusions: ? sodium chloride 100 mL/hr at 04/25/21 0606  ? sodium chloride 10 mL/hr at 04/23/21 0941  ? piperacillin-tazobactam (ZOSYN)  IV 3.375 g (04/25/21 1423)  ? vancomycin    ? ? ?Data Reviewed: ? ?Basic Metabolic Panel: ?Recent Labs  ?Lab 04/22/21 ?1808 04/23/21 ?0125 04/23/21 ?04/25/21 04/23/21 ?1206 04/24/21 ?04/26/21 04/25/21 ?06/25/21  ?NA 134*  --  135  --  132* 137  ?K 4.0  --  4.3  --  4.5 3.9  ?  CL 100  --  101  --  100 103  ?CO2 26  --  25  --  23 26  ?GLUCOSE 289*  --  283* 404* 309* 235*  ?BUN 11  --  11  --  11 10  ?CREATININE 0.95  --  0.83  --  0.81 0.83  ?CALCIUM 9.3  --  8.8*  --  8.5* 8.7*  ?MG  --  1.9 1.8  --   --   --   ?PHOS  --  3.3 2.5  --   --   --   ? ? ?CBC: ?Recent Labs  ?Lab 04/22/21 ?1808 04/23/21 ?0125 04/23/21 ?8502 04/24/21 ?7741 04/25/21 ?2878  ?WBC 17.4* 14.0* 15.2* 17.0* 10.8*  ?NEUTROABS 14.2* 10.3* 14.2* 13.9* 7.0  ?HGB 14.6 14.0 13.8 12.6* 13.1  ?HCT  43.1 42.0 41.1 36.5* 39.1  ?MCV 88.7 89.0 88.0 86.7 88.1  ?PLT 253 229 242 246 287  ? ? ?Studies: ?No results found. ? ?Principal Problem: ?  Sepsis (HCC) ?Active Problems: ?  DM (diabetes mellitus), type 2 with complications (HCC) ?  Hypertension ?  Hyperlipidemia LDL goal <70 ?  DVT, lower extremity (HCC) ?  CAD S/P percutaneous coronary angioplasty ?  Scrotal infection ?  Tobacco abuse ? ? ? ? ?Horatio Pel Orma Flaming, ?[facility] ? ?If 7PM-7AM, please contact night-coverage [email or web address] ? ? LOS: 2 days  ? ?

## 2021-04-25 NOTE — Progress Notes (Signed)
During rounds, Vancomycin IV pump was found not infusing. Patient stated arm was wet, IV was leaking. Catheter removed. IV consult placed. Pt requiring IV vanc and IV zosyn for abx therapy-medications are not compatible to infuse together.   ?

## 2021-04-25 NOTE — Plan of Care (Signed)
?  Problem: Education: ?Goal: Knowledge of General Education information will improve ?Description: Including pain rating scale, medication(s)/side effects and non-pharmacologic comfort measures ?Outcome: Progressing ?  ?Problem: Health Behavior/Discharge Planning: ?Goal: Ability to manage health-related needs will improve ?Outcome: Progressing ?  ?Problem: Clinical Measurements: ?Goal: Ability to maintain clinical measurements within normal limits will improve ?Outcome: Progressing ?Goal: Will remain free from infection ?Outcome: Progressing ?Goal: Diagnostic test results will improve ?Outcome: Progressing ?Goal: Cardiovascular complication will be avoided ?Outcome: Progressing ?  ?Problem: Activity: ?Goal: Risk for activity intolerance will decrease ?Outcome: Progressing ?  ?Problem: Nutrition: ?Goal: Adequate nutrition will be maintained ?Outcome: Progressing ?  ?Problem: Coping: ?Goal: Level of anxiety will decrease ?Outcome: Progressing ?  ?Problem: Elimination: ?Goal: Will not experience complications related to bowel motility ?Outcome: Progressing ?Goal: Will not experience complications related to urinary retention ?Outcome: Progressing ?  ?Problem: Safety: ?Goal: Ability to remain free from injury will improve ?Outcome: Progressing ?  ?Problem: Skin Integrity: ?Goal: Risk for impaired skin integrity will decrease ?Outcome: Progressing ?  ?Problem: Fluid Volume: ?Goal: Hemodynamic stability will improve ?Outcome: Progressing ?  ?Problem: Clinical Measurements: ?Goal: Diagnostic test results will improve ?Outcome: Progressing ?Goal: Signs and symptoms of infection will decrease ?Outcome: Progressing ?  ?Problem: Respiratory: ?Goal: Ability to maintain adequate ventilation will improve ?Outcome: Progressing ?  ?

## 2021-04-26 DIAGNOSIS — N492 Inflammatory disorders of scrotum: Secondary | ICD-10-CM | POA: Diagnosis not present

## 2021-04-26 LAB — GLUCOSE, CAPILLARY
Glucose-Capillary: 281 mg/dL — ABNORMAL HIGH (ref 70–99)
Glucose-Capillary: 186 mg/dL — ABNORMAL HIGH (ref 70–99)
Glucose-Capillary: 314 mg/dL — ABNORMAL HIGH (ref 70–99)
Glucose-Capillary: 357 mg/dL — ABNORMAL HIGH (ref 70–99)

## 2021-04-26 LAB — CBC WITH DIFFERENTIAL/PLATELET
Abs Immature Granulocytes: 0.05 10*3/uL (ref 0.00–0.07)
Basophils Absolute: 0 10*3/uL (ref 0.0–0.1)
Basophils Relative: 0 %
Eosinophils Absolute: 0.1 10*3/uL (ref 0.0–0.5)
Eosinophils Relative: 1 %
HCT: 38.5 % — ABNORMAL LOW (ref 39.0–52.0)
Hemoglobin: 13.1 g/dL (ref 13.0–17.0)
Immature Granulocytes: 1 %
Lymphocytes Relative: 18 %
Lymphs Abs: 1.9 10*3/uL (ref 0.7–4.0)
MCH: 29.8 pg (ref 26.0–34.0)
MCHC: 34 g/dL (ref 30.0–36.0)
MCV: 87.7 fL (ref 80.0–100.0)
Monocytes Absolute: 0.8 10*3/uL (ref 0.1–1.0)
Monocytes Relative: 8 %
Neutro Abs: 7.6 10*3/uL (ref 1.7–7.7)
Neutrophils Relative %: 72 %
Platelets: 291 10*3/uL (ref 150–400)
RBC: 4.39 MIL/uL (ref 4.22–5.81)
RDW: 12.1 % (ref 11.5–15.5)
WBC: 10.6 10*3/uL — ABNORMAL HIGH (ref 4.0–10.5)
nRBC: 0 % (ref 0.0–0.2)

## 2021-04-26 LAB — BASIC METABOLIC PANEL
Anion gap: 6 (ref 5–15)
BUN: 6 mg/dL (ref 6–20)
CO2: 25 mmol/L (ref 22–32)
Calcium: 8.7 mg/dL — ABNORMAL LOW (ref 8.9–10.3)
Chloride: 106 mmol/L (ref 98–111)
Creatinine, Ser: 0.76 mg/dL (ref 0.61–1.24)
GFR, Estimated: >60 mL/min (ref >60)
Glucose, Bld: 324 mg/dL — ABNORMAL HIGH (ref 70–99)
Potassium: 4.1 mmol/L (ref 3.5–5.1)
Sodium: 137 mmol/L (ref 135–145)

## 2021-04-26 MED ORDER — INSULIN GLARGINE-YFGN 100 UNIT/ML ~~LOC~~ SOLN
30.0000 [IU] | Freq: Every day | SUBCUTANEOUS | Status: DC
  Administered 2021-04-27 – 2021-04-28 (×2): 30 [IU] via SUBCUTANEOUS
  Filled 2021-04-26 (×2): qty 0.3

## 2021-04-26 MED ORDER — METOPROLOL SUCCINATE ER 25 MG PO TB24
12.5000 mg | ORAL_TABLET | Freq: Every day | ORAL | Status: DC
  Administered 2021-04-26 – 2021-04-28 (×3): 12.5 mg via ORAL
  Filled 2021-04-26 (×3): qty 1

## 2021-04-26 MED ORDER — SODIUM CHLORIDE 0.9 % IV SOLN
3.0000 g | Freq: Four times a day (QID) | INTRAVENOUS | Status: DC
  Administered 2021-04-26 – 2021-05-01 (×21): 3 g via INTRAVENOUS
  Filled 2021-04-26 (×24): qty 8

## 2021-04-26 NOTE — Progress Notes (Signed)
Pharmacy Antibiotic Note ? ?Greg Quinn is a 51 y.o. male admitted on 04/22/2021 with scrotal abscess and early Fournier's gangrene s/p I&D 3/30. Pharmacy has been consulted for Unasyn dosing. ? ?D#5 total antibiotics - D#1 Unasyn ?WBC down (10s), afebrile ? ?Plan: ?Stop vancomycin and piperacillin/tazobactam ?Start Unasyn 3g IV every 6 hours ?Monitor cultures, clinical status, renal function, LOT ? ?Height: 5' 10.5" (179.1 cm) ?Weight: 123.4 kg (272 lb) (scale c) ?IBW/kg (Calculated) : 74.15 ? ?Temp (24hrs), Avg:98.3 ?F (36.8 ?C), Min:98 ?F (36.7 ?C), Max:98.4 ?F (36.9 ?C) ? ?Recent Labs  ?Lab 04/22/21 ?1808 04/22/21 ?1902 04/23/21 ?0125 04/23/21 ?9937 04/24/21 ?1696 04/25/21 ?7893 04/25/21 ?8101 04/26/21 ?0430  ?WBC 17.4*  --  14.0* 15.2* 17.0* 10.8*  --  10.6*  ?CREATININE 0.95  --   --  0.83 0.81 0.83  --  0.76  ?LATICACIDVEN 1.0 1.3  --   --   --   --   --   --   ?VANCOTROUGH  --   --   --   --   --  9*  --   --   ?VANCOPEAK  --   --   --   --   --   --  20*  --   ? ?  ?Estimated Creatinine Clearance: 143.5 mL/min (by C-G formula based on SCr of 0.76 mg/dL).   ? ?Allergies  ?Allergen Reactions  ? Heparin Other (See Comments)  ? Pork-Derived Products   ? ? ?Antimicrobials this admission: ?Vancomycin 3/29 >> 4/2 ?Zosyn 3/29 >> 4/2 ?Unasyn 4/2 >> ? ?Dose adjustments this admission: ?none ? ?Microbiology results: ?3/30 Bcx: NG x3d ?3/30 OR abscess: few GPC, few GNR >> moderate S. Agalactiae, rare staph lug; reincubated for possible anaerobe  ?3/29 UCx: 500 CFU E faecalis ? ?Thank you for allowing pharmacy to participate in this patient's care. ? ?Filbert Schilder, PharmD ?PGY1 Pharmacy Resident ?04/26/2021  11:53 AM ? ?Please check [email or web address] for unit-specific pharmacy phone numbers. ? ? ?

## 2021-04-26 NOTE — Progress Notes (Signed)
? ?PROGRESS NOTE ? ? ? ?[employer]  ?[id number]  ?DOB: 1970/02/14  ?DOA: 04/22/2021 ?PCP: Pcp, No ?Outpatient Specialists: ? ? ?Hospital course: ?51 year old man with poorly controlled diabetes, HTN, HLD, CAD/NSTEMI on Plavix, DVT/PE on Xarelto, s/p IVC filter, current everyday smoker was admitted 04/22/2021 with Fournier's gangrene.  He has been treated by urology with intraoperative I&D and vancomycin and Zosyn.  Patient has grown out pansensitive E faecalis.  Patient was changed to high-dose Unasyn on 04/26/2021. ? ? ?Subjective: ?Patient without new complaints.  Notes he continues to have discomfort with dressing changes as it continues to be difficult to coordinate premedication with morphine. ? ?Objective: ?Vitals:  ? 04/26/21 0544 04/26/21 0903 04/26/21 0944 04/26/21 1123  ?BP:  (!) 157/94  (!) 160/85  ?Pulse:  84  75  ?Resp:  18 18 20   ?Temp:  98.4 ?F (36.9 ?C)  98 ?F (36.7 ?C)  ?TempSrc:  Oral  Oral  ?SpO2:  95%  94%  ?Weight: 123.4 kg     ?Height:      ? ? ?Intake/Output Summary (Last 24 hours) at 04/26/2021 1327 ?Last data filed at 04/26/2021 1122 ?Gross per 24 hour  ?Intake 820 ml  ?Output 4350 ml  ?Net -3530 ml  ? ? ?Filed Weights  ? 04/24/21 0038 04/25/21 0433 04/26/21 0544  ?Weight: 126 kg 126.2 kg 123.4 kg  ? ? ? ?Exam: ? ?General: Obese man in good spirits lying in bed in NAD ?Eyes: sclera anicteric, conjuctiva mild injection bilaterally ?CVS: S1-S2, regular  ?Respiratory: Reasonable air entry  ?GI/back: NABS, soft, NT, wound VAC in place ?LE: No edema.  ?Neuro: grossly nonfocal.  ?Psych: patient is logical and coherent, judgement and insight appear normal, mood and affect appropriate to situation. ? ? ?Assessment & Plan: ?  ?Fournier's gangrene ?S/p intraoperative I&D and debridement on 04/23/2021 ?Patient growing out pansensitive Enterococcus faecalis ?Change from Vanco and Zosyn to high-dose Unasyn today antibiotic day #4 ?Leukocytosis improved, now seems to be stable around 11 ?Order  entered for nurses to pretreat with IV morphine prior to dressing changes ? ?IDDM ?Blood sugars improved but not normalized with increase of glargine to 25 ?Will increase glargine to 30 units to start tomorrow ?Patient is being followed by diabetes coordinator ?Follow fingersticks and increase glargine tomorrow as warranted ? ?HTN ?BP remain elevated on lisinopril,  ?Will add back metoprolol from home meds.   ? ?CAD ?No active disease at present ?Continue Lipitor and amlodipine ?Aspirin and Plavix are on hold given possible need for ongoing I&D/debridement ? ?h/o VTE/PE ?S/p IVC filter ?Xarelto was on hold ?Apparently allergic to heparin products possibly HIT ?Xarelto can be restarted upon discharge ? ? ? ?DVT prophylaxis: SCD ?Code Status: Full ?Family Communication: None at bedside, patient states not necessary to call ?Disposition Plan:  ? Patient is from: Home ? Anticipated Discharge Location: TBD ? Barriers to Discharge: Ongoing infection ? Is patient medically stable for Discharge: No ? ? ?Scheduled Meds: ? atorvastatin  80 mg Oral QHS  ? Chlorhexidine Gluconate Cloth  6 each Topical Daily  ? insulin aspart  0-15 Units Subcutaneous TID WC  ? insulin glargine-yfgn  25 Units Subcutaneous Daily  ? lisinopril  20 mg Oral Daily  ? metoprolol succinate  12.5 mg Oral Daily  ? nicotine  7 mg Transdermal Daily  ? senna-docusate  2 tablet Oral BID  ? ?Continuous Infusions: ? sodium chloride 100 mL/hr at 04/26/21 0737  ? sodium chloride 10 mL/hr at 04/23/21 0941  ?  ampicillin-sulbactam (UNASYN) IV 3 g (04/26/21 1059)  ? ? ?Data Reviewed: ? ?Basic Metabolic Panel: ?Recent Labs  ?Lab 04/22/21 ?1808 04/23/21 ?0125 04/23/21 ?1610 04/23/21 ?1206 04/24/21 ?9604 04/25/21 ?5409 04/26/21 ?0430  ?NA 134*  --  135  --  132* 137 137  ?K 4.0  --  4.3  --  4.5 3.9 4.1  ?CL 100  --  101  --  100 103 106  ?CO2 26  --  25  --  23 26 25   ?GLUCOSE 289*  --  283* 404* 309* 235* 324*  ?BUN 11  --  11  --  11 10 6   ?CREATININE 0.95  --  0.83   --  0.81 0.83 0.76  ?CALCIUM 9.3  --  8.8*  --  8.5* 8.7* 8.7*  ?MG  --  1.9 1.8  --   --   --   --   ?PHOS  --  3.3 2.5  --   --   --   --   ? ? ? ?CBC: ?Recent Labs  ?Lab 04/23/21 ?0125 04/23/21 ?04/25/21 04/24/21 ?8119 04/25/21 ?1478 04/26/21 ?0430  ?WBC 14.0* 15.2* 17.0* 10.8* 10.6*  ?NEUTROABS 10.3* 14.2* 13.9* 7.0 7.6  ?HGB 14.0 13.8 12.6* 13.1 13.1  ?HCT 42.0 41.1 36.5* 39.1 38.5*  ?MCV 89.0 88.0 86.7 88.1 87.7  ?PLT 229 242 246 287 291  ? ? ? ?Studies: ?No results found. ? ?Principal Problem: ?  Sepsis (HCC) ?Active Problems: ?  DM (diabetes mellitus), type 2 with complications (HCC) ?  Hypertension ?  Hyperlipidemia LDL goal <70 ?  DVT, lower extremity (HCC) ?  CAD S/P percutaneous coronary angioplasty ?  Scrotal infection ?  Tobacco abuse ? ? ? ? ?2956 06/26/21, ?[facility] ? ?If 7PM-7AM, please contact night-coverage [email or web address] ? ? LOS: 3 days  ? ?

## 2021-04-27 DIAGNOSIS — A419 Sepsis, unspecified organism: Secondary | ICD-10-CM | POA: Diagnosis not present

## 2021-04-27 LAB — GLUCOSE, CAPILLARY
Glucose-Capillary: 202 mg/dL — ABNORMAL HIGH (ref 70–99)
Glucose-Capillary: 186 mg/dL — ABNORMAL HIGH (ref 70–99)
Glucose-Capillary: 244 mg/dL — ABNORMAL HIGH (ref 70–99)
Glucose-Capillary: 267 mg/dL — ABNORMAL HIGH (ref 70–99)

## 2021-04-27 LAB — CBC WITH DIFFERENTIAL/PLATELET
Abs Immature Granulocytes: 0.08 10*3/uL — ABNORMAL HIGH (ref 0.00–0.07)
Basophils Absolute: 0 10*3/uL (ref 0.0–0.1)
Basophils Relative: 0 %
Eosinophils Absolute: 0.2 10*3/uL (ref 0.0–0.5)
Eosinophils Relative: 2 %
HCT: 39.1 % (ref 39.0–52.0)
Hemoglobin: 13.4 g/dL (ref 13.0–17.0)
Immature Granulocytes: 1 %
Lymphocytes Relative: 20 %
Lymphs Abs: 2.1 10*3/uL (ref 0.7–4.0)
MCH: 30 pg (ref 26.0–34.0)
MCHC: 34.3 g/dL (ref 30.0–36.0)
MCV: 87.5 fL (ref 80.0–100.0)
Monocytes Absolute: 0.8 10*3/uL (ref 0.1–1.0)
Monocytes Relative: 7 %
Neutro Abs: 7.6 10*3/uL (ref 1.7–7.7)
Neutrophils Relative %: 70 %
Platelets: 302 10*3/uL (ref 150–400)
RBC: 4.47 MIL/uL (ref 4.22–5.81)
RDW: 11.9 % (ref 11.5–15.5)
WBC: 10.8 10*3/uL — ABNORMAL HIGH (ref 4.0–10.5)
nRBC: 0 % (ref 0.0–0.2)

## 2021-04-27 LAB — BASIC METABOLIC PANEL
Anion gap: 6 (ref 5–15)
BUN: 6 mg/dL (ref 6–20)
CO2: 26 mmol/L (ref 22–32)
Calcium: 8.8 mg/dL — ABNORMAL LOW (ref 8.9–10.3)
Chloride: 104 mmol/L (ref 98–111)
Creatinine, Ser: 0.71 mg/dL (ref 0.61–1.24)
GFR, Estimated: >60 mL/min (ref >60)
Glucose, Bld: 216 mg/dL — ABNORMAL HIGH (ref 70–99)
Potassium: 4 mmol/L (ref 3.5–5.1)
Sodium: 136 mmol/L (ref 135–145)

## 2021-04-27 LAB — AEROBIC/ANAEROBIC CULTURE W GRAM STAIN (SURGICAL/DEEP WOUND)

## 2021-04-27 NOTE — Progress Notes (Signed)
Mobility Specialist Progress Note: ? ? 04/27/21 1350  ?Mobility  ?Activity Ambulated with assistance in hallway  ?Level of Assistance Standby assist, set-up cues, supervision of patient - no hands on  ?Assistive Device Other (Comment) ?(IV Pole)  ?Distance Ambulated (ft) 240 ft  ?Activity Response Tolerated well  ?$Mobility charge 1 Mobility  ? ?Pt with 7/10 scrotal pain throughout ambulation. No assistance required. Back in bed with all needs met.  ? ?Anna Kincaid ?Acute Rehab ?Phone: [phone] ?Office Phone: [phone] ? ?

## 2021-04-27 NOTE — Anesthesia Postprocedure Evaluation (Signed)
Anesthesia Post Note ? ?Patient: Greg Quinn ? ?Procedure(s) Performed: INCISION AND DRAINAGE ABSCESS (Scrotum) ?CYSTOSCOPY (Urethra) ? ?  ? ?Patient location during evaluation: PACU ?Anesthesia Type: General ?Level of consciousness: awake and alert ?Pain management: pain level controlled ?Vital Signs Assessment: post-procedure vital signs reviewed and stable ?Respiratory status: spontaneous breathing, nonlabored ventilation, respiratory function stable and patient connected to nasal cannula oxygen ?Cardiovascular status: blood pressure returned to baseline and stable ?Postop Assessment: no apparent nausea or vomiting ?Anesthetic complications: no ? ? ?No notable events documented. ? ?Last Vitals:  ?Vitals:  ? 04/27/21 0457 04/27/21 0850  ?BP: (!) 148/72 (!) 147/82  ?Pulse:  80  ?Resp: 20 18  ?Temp:  37.1 ?C  ?SpO2:  95%  ?  ?Last Pain:  ?Vitals:  ? 04/27/21 1700  ?TempSrc:   ?PainSc: 8   ? ? ?  ?  ?  ?  ?  ?  ? ?Collene Schlichter ? ? ? ? ?

## 2021-04-27 NOTE — Progress Notes (Signed)
Reviewed notes and spoke with Dr Berneice Heinrich ?Sugars improving  ?Cultures positive ?WBC normal and afebrile ?Wound healing well- primary problem discussed with Dr Berneice Heinrich is size of testis relative to skin closure ability ?The cavity is dramatically smaller but still a gap with testis ?I finger dissected skin flap minimally well tolerated; irrigated; used steri strips to try to hold skin close to cover testicle ?Will get plastic opinion  ? ?

## 2021-04-27 NOTE — Progress Notes (Signed)
? ?PROGRESS NOTE ? ? ? ?[employer]  ?BJY:[id number]  ?DOB: 26-May-1970  ?DOA: 04/22/2021 ?PCP: Pcp, No ?Outpatient Specialists: ? ? ?Hospital course: ?51 year old man with poorly controlled diabetes, HTN, HLD, CAD/NSTEMI on Plavix, DVT/PE on Xarelto, s/p IVC filter, current everyday smoker was admitted 04/22/2021 with Fournier's gangrene.  He has been treated by urology with intraoperative I&D and vancomycin and Zosyn.  Patient has grown out pansensitive E faecalis.  Patient was changed to high-dose Unasyn on 04/26/2021. ? ? ?Subjective: ?Patient without new complaints.  Is appreciative of the pain management attempts with the nurses ? ?Objective: ?Vitals:  ? 04/26/21 1944 04/27/21 0426 04/27/21 0457 04/27/21 0850  ?BP:   (!) 148/72 (!) 147/82  ?Pulse: 78 87  80  ?Resp: 20  20 18   ?Temp: 98.2 ?F (36.8 ?C) 98.5 ?F (36.9 ?C)  98.7 ?F (37.1 ?C)  ?TempSrc: Oral Oral  Oral  ?SpO2: 99% 97%  95%  ?Weight:  123.1 kg    ?Height:      ? ? ?Intake/Output Summary (Last 24 hours) at 04/27/2021 1630 ?Last data filed at 04/27/2021 1246 ?Gross per 24 hour  ?Intake 900 ml  ?Output 8800 ml  ?Net -7900 ml  ? ? ?Filed Weights  ? 04/25/21 0433 04/26/21 0544 04/27/21 0426  ?Weight: 126.2 kg 123.4 kg 123.1 kg  ? ? ? ?Exam: ? ?General: Obese man in good spirits lying in bed in NAD ?Eyes: sclera anicteric, conjuctiva mild injection bilaterally ?CVS: S1-S2, regular  ?Respiratory: Reasonable air entry  ?GI/back: NABS, soft, NT, wound VAC in place ?LE: No edema.  ?Neuro: grossly nonfocal.  ?Psych: patient is logical and coherent, judgement and insight appear normal, mood and affect appropriate to situation. ? ? ?Assessment & Plan: ?  ?Fournier's gangrene ?S/p intraoperative I&D and debridement on 04/23/2021 ?Patient growing out pansensitive Enterococcus faecalis ?Change from Vanco and Zosyn to high-dose Unasyn today antibiotic day #5 ?Leukocytosis improved, now seems to be stable around 11 ?Order entered for nurses to pretreat with IV  morphine prior to dressing changes ? ?IDDM ?Blood sugars improved on glargine 30, will not increase today ?Follow fingersticks and reevaluate tomorrow ?Patient is being followed by diabetes coordinator ? ?HTN ?BP improved with the addition of metoprolol  ?Continue metoprolol and lisinopril ? ?CAD ?No active disease at present ?Continue Lipitor and amlodipine ?Aspirin and Plavix are on hold given possible need for ongoing I&D/debridement ? ?h/o VTE/PE ?S/p IVC filter ?Xarelto was on hold ?Apparently allergic to heparin products possibly HIT ?Xarelto can be restarted upon discharge ? ? ? ?DVT prophylaxis: SCD ?Code Status: Full ?Family Communication: None at bedside, patient states not necessary to call ?Disposition Plan:  ? Patient is from: Home ? Anticipated Discharge Location: TBD ? Barriers to Discharge: Ongoing infection ? Is patient medically stable for Discharge: No ? ? ?Scheduled Meds: ? atorvastatin  80 mg Oral QHS  ? Chlorhexidine Gluconate Cloth  6 each Topical Daily  ? insulin aspart  0-15 Units Subcutaneous TID WC  ? insulin glargine-yfgn  30 Units Subcutaneous Daily  ? lisinopril  20 mg Oral Daily  ? metoprolol succinate  12.5 mg Oral Daily  ? nicotine  7 mg Transdermal Daily  ? senna-docusate  2 tablet Oral BID  ? ?Continuous Infusions: ? sodium chloride Stopped (04/26/21 0840)  ? sodium chloride 10 mL/hr at 04/23/21 0941  ? ampicillin-sulbactam (UNASYN) IV 3 g (04/27/21 1123)  ? ? ?Data Reviewed: ? ?Basic Metabolic Panel: ?Recent Labs  ?Lab 04/23/21 ?0125 04/23/21 ?04/25/21  04/23/21 ?1206 04/24/21 ?0356 04/25/21 ?0353 04/26/21 ?0430 04/27/21 ?0245  ?NA  --  135  --  132* 137 137 136  ?K  --  4.3  --  4.5 3.9 4.1 4.0  ?CL  --  101  --  100 103 106 104  ?CO2  --  25  --  23 26 25 26   ?GLUCOSE  --  283* 404* 309* 235* 324* 216*  ?BUN  --  11  --  11 10 6 6   ?CREATININE  --  0.83  --  0.81 0.83 0.76 0.71  ?CALCIUM  --  8.8*  --  8.5* 8.7* 8.7* 8.8*  ?MG 1.9 1.8  --   --   --   --   --   ?PHOS 3.3 2.5  --   --    --   --   --   ? ? ? ?CBC: ?Recent Labs  ?Lab 04/23/21 ?0628 04/24/21 ?0356 04/25/21 ?0353 04/26/21 ?0430 04/27/21 ?0245  ?WBC 15.2* 17.0* 10.8* 10.6* 10.8*  ?NEUTROABS 14.2* 13.9* 7.0 7.6 7.6  ?HGB 13.8 12.6* 13.1 13.1 13.4  ?HCT 41.1 36.5* 39.1 38.5* 39.1  ?MCV 88.0 86.7 88.1 87.7 87.5  ?PLT 242 246 287 291 302  ? ? ? ?Studies: ?No results found. ? ?Principal Problem: ?  Sepsis (HCC) ?Active Problems: ?  DM (diabetes mellitus), type 2 with complications (HCC) ?  Hypertension ?  Hyperlipidemia LDL goal <70 ?  DVT, lower extremity (HCC) ?  CAD S/P percutaneous coronary angioplasty ?  Scrotal infection ?  Tobacco abuse ? ? ? ? ?06/26/21 06/27/21, ?[facility] ? ?If 7PM-7AM, please contact night-coverage [email or web address] ? ? LOS: 4 days  ? ?

## 2021-04-27 NOTE — Plan of Care (Signed)

## 2021-04-28 DIAGNOSIS — A419 Sepsis, unspecified organism: Secondary | ICD-10-CM | POA: Diagnosis not present

## 2021-04-28 LAB — GLUCOSE, CAPILLARY
Glucose-Capillary: 211 mg/dL — ABNORMAL HIGH (ref 70–99)
Glucose-Capillary: 210 mg/dL — ABNORMAL HIGH (ref 70–99)
Glucose-Capillary: 199 mg/dL — ABNORMAL HIGH (ref 70–99)
Glucose-Capillary: 183 mg/dL — ABNORMAL HIGH (ref 70–99)

## 2021-04-28 LAB — CBC WITH DIFFERENTIAL/PLATELET
Abs Immature Granulocytes: 0.07 10*3/uL (ref 0.00–0.07)
Basophils Absolute: 0 10*3/uL (ref 0.0–0.1)
Basophils Relative: 0 %
Eosinophils Absolute: 0.2 10*3/uL (ref 0.0–0.5)
Eosinophils Relative: 2 %
HCT: 41.2 % (ref 39.0–52.0)
Hemoglobin: 13.8 g/dL (ref 13.0–17.0)
Immature Granulocytes: 1 %
Lymphocytes Relative: 18 %
Lymphs Abs: 2 10*3/uL (ref 0.7–4.0)
MCH: 29.6 pg (ref 26.0–34.0)
MCHC: 33.5 g/dL (ref 30.0–36.0)
MCV: 88.2 fL (ref 80.0–100.0)
Monocytes Absolute: 0.9 10*3/uL (ref 0.1–1.0)
Monocytes Relative: 8 %
Neutro Abs: 8.2 10*3/uL — ABNORMAL HIGH (ref 1.7–7.7)
Neutrophils Relative %: 71 %
Platelets: 320 10*3/uL (ref 150–400)
RBC: 4.67 MIL/uL (ref 4.22–5.81)
RDW: 12.2 % (ref 11.5–15.5)
WBC: 11.4 10*3/uL — ABNORMAL HIGH (ref 4.0–10.5)
nRBC: 0 % (ref 0.0–0.2)

## 2021-04-28 LAB — BASIC METABOLIC PANEL
Anion gap: 7 (ref 5–15)
BUN: 10 mg/dL (ref 6–20)
CO2: 26 mmol/L (ref 22–32)
Calcium: 8.8 mg/dL — ABNORMAL LOW (ref 8.9–10.3)
Chloride: 103 mmol/L (ref 98–111)
Creatinine, Ser: 0.82 mg/dL (ref 0.61–1.24)
GFR, Estimated: >60 mL/min (ref >60)
Glucose, Bld: 224 mg/dL — ABNORMAL HIGH (ref 70–99)
Potassium: 4.3 mmol/L (ref 3.5–5.1)
Sodium: 136 mmol/L (ref 135–145)

## 2021-04-28 LAB — CULTURE, BLOOD (ROUTINE X 2)
Culture: NO GROWTH
Culture: NO GROWTH
Special Requests: ADEQUATE
Special Requests: ADEQUATE

## 2021-04-28 MED ORDER — INSULIN GLARGINE-YFGN 100 UNIT/ML ~~LOC~~ SOLN
35.0000 [IU] | Freq: Every day | SUBCUTANEOUS | Status: DC
  Administered 2021-04-29 – 2021-05-01 (×3): 35 [IU] via SUBCUTANEOUS
  Filled 2021-04-28 (×3): qty 0.35

## 2021-04-28 MED ORDER — METOPROLOL SUCCINATE ER 25 MG PO TB24
25.0000 mg | ORAL_TABLET | Freq: Every day | ORAL | Status: DC
  Administered 2021-04-29 – 2021-05-01 (×3): 25 mg via ORAL
  Filled 2021-04-28 (×3): qty 1

## 2021-04-28 NOTE — Progress Notes (Signed)
Read Plastic note and pending ?Steristrips really looked good today with mild dehsicence and draining  ?Will speak to plastics and priamry re home soon ? ?

## 2021-04-28 NOTE — Plan of Care (Signed)
?  Problem: Clinical Measurements: ?Goal: Ability to maintain clinical measurements within normal limits will improve ?Outcome: Progressing ?Goal: Diagnostic test results will improve ?Outcome: Progressing ?Goal: Cardiovascular complication will be avoided ?Outcome: Progressing ?  ?Problem: Activity: ?Goal: Risk for activity intolerance will decrease ?Outcome: Progressing ?  ?Problem: Coping: ?Goal: Level of anxiety will decrease ?Outcome: Progressing ?  ?Problem: Elimination: ?Goal: Will not experience complications related to urinary retention ?Outcome: Progressing ?  ?Problem: Safety: ?Goal: Ability to remain free from injury will improve ?Outcome: Progressing ?  ?

## 2021-04-28 NOTE — Progress Notes (Signed)
Mobility Specialist Progress Note: ? ? 04/28/21 1450  ?Mobility  ?Activity Ambulated independently in hallway  ?Level of Assistance Modified independent, requires aide device or extra time  ?Assistive Device Other (Comment) ?(IV Pole)  ?Distance Ambulated (ft) 270 ft  ?Activity Response Tolerated well  ?$Mobility charge 1 Mobility  ? ?Pt with R foot and scrotum pain during ambulation. Otherwise asx, pt back in bed with all needs met.  ? ?Greg Quinn ?Acute Rehab ?Phone: [phone] ?Office Phone: [phone] ? ?

## 2021-04-28 NOTE — Progress Notes (Signed)
Reason for Consult: Fournier's gangrene, scrotum. ?Referring Physician: Dr. Sherron Monday ? ?Greg Quinn is an 51 y.o. male.  ?HPI: Patient is a 51 year old male with PMH of bilateral PE s/p IVC filter placement, HTN, type II DM, and CAD on Plavix and Xarelto who was admitted on 04/23/2021 for scrotal abscess and early Fournier's gangrene with possible epididymitis or orchitis. ? ?Patient was immediately brought to the operating room 04/23/2021 by Dr. Sherron Monday for incision and drainage with irrigation.  Since then, leukocytosis has improved and his cavity is dramatically smaller per most recent notes.  Blood cultures were positive for E faecalis.  However, plastic surgery was consulted given concern for skin covering exposed testicle.  Steri-Strips were placed yesterday in effort to help approximate the skin edges. ? ?On my examination, patient states that he is feeling improved, but continues to endorse a deep ache.  He reports intermittent drainage from the I&D site, but that it is slowing.  He is having dressing changes twice daily.  4 x 4 gauze and ABDs placed over his I&D site, secured with mesh underwear. ? ? ?Past Medical History:  ?Diagnosis Date  ? Bilateral pulmonary embolism (HCC)   ? 2009, on xarelto, IVC filter  ? Coronary artery disease   ? Current every day smoker   ? 25 pack year history  ? DVT, lower extremity (HCC)   ? 2009  ? Hyperlipidemia LDL goal <70   ? Hypertension   ? NSTEMI (non-ST elevated myocardial infarction) (HCC) 08/20/2020  ? NSTEMI (non-ST elevated myocardial infarction) (HCC) 08/20/2020  ? Uncontrolled diabetes mellitus   ? ? ?Past Surgical History:  ?Procedure Laterality Date  ? CORONARY STENT INTERVENTION N/A 08/20/2020  ? Procedure: CORONARY STENT INTERVENTION;  Surgeon: Corky Crafts, MD;  Location: [facility] INVASIVE CV LAB;  Service: Cardiovascular;  Laterality: N/A;  ? CYSTOSCOPY N/A 04/23/2021  ? Procedure: CYSTOSCOPY;  Surgeon: Alfredo Martinez, MD;  Location: [facility]  OR;  Service: Urology;  Laterality: N/A;  ? INCISION AND DRAINAGE ABSCESS N/A 04/23/2021  ? Procedure: INCISION AND DRAINAGE ABSCESS;  Surgeon: Alfredo Martinez, MD;  Location: [facility] OR;  Service: Urology;  Laterality: N/A;  ? INTRAVASCULAR ULTRASOUND/IVUS N/A 08/20/2020  ? Procedure: Intravascular Ultrasound/IVUS;  Surgeon: Corky Crafts, MD;  Location: [facility] INVASIVE CV LAB;  Service: Cardiovascular;  Laterality: N/A;  ? LEFT HEART CATH AND CORONARY ANGIOGRAPHY N/A 08/20/2020  ? Procedure: LEFT HEART CATH AND CORONARY ANGIOGRAPHY;  Surgeon: Corky Crafts, MD;  Location: [facility] INVASIVE CV LAB;  Service: Cardiovascular;  Laterality: N/A;  ? ? ?Family History  ?Problem Relation Age of Onset  ? Diabetes Father   ? ? ?Social History:  reports that he has been smoking cigarettes. He has been smoking an average of 1 pack per day. He has never used smokeless tobacco. He reports current alcohol use. He reports that he does not use drugs. ? ?Allergies:  ?Allergies  ?Allergen Reactions  ? Heparin Other (See Comments)  ? Pork-Derived Products   ? ? ?Medications: I have reviewed the patient's current medications. ? ?Results for orders placed or performed during the hospital encounter of 04/22/21 (from the past 48 hour(s))  ?Glucose, capillary     Status: Abnormal  ? Collection Time: 04/26/21  4:21 PM  ?Result Value Ref Range  ? Glucose-Capillary 314 (H) 70 - 99 mg/dL  ?  Comment: Glucose reference range applies only to samples taken after fasting for at least 8 hours.  ?Glucose, capillary     Status: Abnormal  ?  Collection Time: 04/26/21  9:18 PM  ?Result Value Ref Range  ? Glucose-Capillary 357 (H) 70 - 99 mg/dL  ?  Comment: Glucose reference range applies only to samples taken after fasting for at least 8 hours.  ? Comment 1 Notify RN   ? Comment 2 Document in Chart   ?Basic metabolic panel     Status: Abnormal  ? Collection Time: 04/27/21  2:45 AM  ?Result Value Ref Range  ? Sodium 136 135 - 145 mmol/L  ? Potassium 4.0  3.5 - 5.1 mmol/L  ? Chloride 104 98 - 111 mmol/L  ? CO2 26 22 - 32 mmol/L  ? Glucose, Bld 216 (H) 70 - 99 mg/dL  ?  Comment: Glucose reference range applies only to samples taken after fasting for at least 8 hours.  ? BUN 6 6 - 20 mg/dL  ? Creatinine, Ser 0.71 0.61 - 1.24 mg/dL  ? Calcium 8.8 (L) 8.9 - 10.3 mg/dL  ? GFR, Estimated >60 >60 mL/min  ?  Comment: (NOTE) ?Calculated using the CKD-EPI Creatinine Equation (2021) ?  ? Anion gap 6 5 - 15  ?  Comment: Performed at [facility], [address] [place], [place] [address]  ?CBC with Differential/Platelet     Status: Abnormal  ? Collection Time: 04/27/21  2:45 AM  ?Result Value Ref Range  ? WBC 10.8 (H) 4.0 - 10.5 K/uL  ? RBC 4.47 4.22 - 5.81 MIL/uL  ? Hemoglobin 13.4 13.0 - 17.0 g/dL  ? HCT 39.1 39.0 - 52.0 %  ? MCV 87.5 80.0 - 100.0 fL  ? MCH 30.0 26.0 - 34.0 pg  ? MCHC 34.3 30.0 - 36.0 g/dL  ? RDW 11.9 11.5 - 15.5 %  ? Platelets 302 150 - 400 K/uL  ? nRBC 0.0 0.0 - 0.2 %  ? Neutrophils Relative % 70 %  ? Neutro Abs 7.6 1.7 - 7.7 K/uL  ? Lymphocytes Relative 20 %  ? Lymphs Abs 2.1 0.7 - 4.0 K/uL  ? Monocytes Relative 7 %  ? Monocytes Absolute 0.8 0.1 - 1.0 K/uL  ? Eosinophils Relative 2 %  ? Eosinophils Absolute 0.2 0.0 - 0.5 K/uL  ? Basophils Relative 0 %  ? Basophils Absolute 0.0 0.0 - 0.1 K/uL  ? Immature Granulocytes 1 %  ? Abs Immature Granulocytes 0.08 (H) 0.00 - 0.07 K/uL  ?  Comment: Performed at [facility], [address] [place], [place] [address]  ?Glucose, capillary     Status: Abnormal  ? Collection Time: 04/27/21  6:26 AM  ?Result Value Ref Range  ? Glucose-Capillary 202 (H) 70 - 99 mg/dL  ?  Comment: Glucose reference range applies only to samples taken after fasting for at least 8 hours.  ? Comment 1 Notify RN   ? Comment 2 Document in Chart   ?Glucose, capillary     Status: Abnormal  ? Collection Time: 04/27/21 11:53 AM  ?Result Value Ref Range  ? Glucose-Capillary 186 (H) 70 - 99 mg/dL  ?  Comment: Glucose reference range  applies only to samples taken after fasting for at least 8 hours.  ?Glucose, capillary     Status: Abnormal  ? Collection Time: 04/27/21  4:34 PM  ?Result Value Ref Range  ? Glucose-Capillary 244 (H) 70 - 99 mg/dL  ?  Comment: Glucose reference range applies only to samples taken after fasting for at least 8 hours.  ?Glucose, capillary     Status: Abnormal  ?  Collection Time: 04/27/21  9:01 PM  ?Result Value Ref Range  ? Glucose-Capillary 267 (H) 70 - 99 mg/dL  ?  Comment: Glucose reference range applies only to samples taken after fasting for at least 8 hours.  ? Comment 1 Notify RN   ? Comment 2 Document in Chart   ?Basic metabolic panel     Status: Abnormal  ? Collection Time: 04/28/21  4:16 AM  ?Result Value Ref Range  ? Sodium 136 135 - 145 mmol/L  ? Potassium 4.3 3.5 - 5.1 mmol/L  ? Chloride 103 98 - 111 mmol/L  ? CO2 26 22 - 32 mmol/L  ? Glucose, Bld 224 (H) 70 - 99 mg/dL  ?  Comment: Glucose reference range applies only to samples taken after fasting for at least 8 hours.  ? BUN 10 6 - 20 mg/dL  ? Creatinine, Ser 0.82 0.61 - 1.24 mg/dL  ? Calcium 8.8 (L) 8.9 - 10.3 mg/dL  ? GFR, Estimated >60 >60 mL/min  ?  Comment: (NOTE) ?Calculated using the CKD-EPI Creatinine Equation (2021) ?  ? Anion gap 7 5 - 15  ?  Comment: Performed at [facility], [address] [place], [place] [id number]  ?CBC with Differential/Platelet     Status: Abnormal  ? Collection Time: 04/28/21  4:16 AM  ?Result Value Ref Range  ? WBC 11.4 (H) 4.0 - 10.5 K/uL  ? RBC 4.67 4.22 - 5.81 MIL/uL  ? Hemoglobin 13.8 13.0 - 17.0 g/dL  ? HCT 41.2 39.0 - 52.0 %  ? MCV 88.2 80.0 - 100.0 fL  ? MCH 29.6 26.0 - 34.0 pg  ? MCHC 33.5 30.0 - 36.0 g/dL  ? RDW 12.2 11.5 - 15.5 %  ? Platelets 320 150 - 400 K/uL  ? nRBC 0.0 0.0 - 0.2 %  ? Neutrophils Relative % 71 %  ? Neutro Abs 8.2 (H) 1.7 - 7.7 K/uL  ? Lymphocytes Relative 18 %  ? Lymphs Abs 2.0 0.7 - 4.0 K/uL  ? Monocytes Relative 8 %  ? Monocytes Absolute 0.9 0.1 - 1.0 K/uL  ? Eosinophils Relative  2 %  ? Eosinophils Absolute 0.2 0.0 - 0.5 K/uL  ? Basophils Relative 0 %  ? Basophils Absolute 0.0 0.0 - 0.1 K/uL  ? Immature Granulocytes 1 %  ? Abs Immature Granulocytes 0.07 0.00 - 0.07 K/uL  ?  Comment: P

## 2021-04-28 NOTE — Progress Notes (Signed)
? ?PROGRESS NOTE ? ? ? ?[employer]  ?VXB:[id number]  ?DOB: August 17, 1970  ?DOA: 04/22/2021 ?PCP: Pcp, No ?Outpatient Specialists: ? ? ?Hospital course: ?51 year old man with poorly controlled diabetes, HTN, HLD, CAD/NSTEMI on Plavix, DVT/PE on Xarelto, s/p IVC filter, current everyday smoker was admitted 04/22/2021 with Fournier's gangrene.  He has been treated by urology with intraoperative I&D and vancomycin and Zosyn.  Patient has grown out pansensitive E faecalis.  Patient was changed to high-dose Unasyn on 04/26/2021. ? ? ?Subjective: ?Patient without new complaints.  Feels he is indeed improving.  Notes less pain. ? ?Objective: ?Vitals:  ? 04/27/21 2026 04/27/21 2327 04/28/21 0449 04/28/21 0748  ?BP: (!) 162/87 (!) 159/82 (!) 155/79 (!) 152/93  ?Pulse: 84 83 88 81  ?Resp: 17 16 18 18   ?Temp: 98.5 ?F (36.9 ?C) 98.4 ?F (36.9 ?C) 98.5 ?F (36.9 ?C) 97.6 ?F (36.4 ?C)  ?TempSrc: Oral Oral Oral Oral  ?SpO2: 95% 97% 97% 93%  ?Weight:      ?Height:      ? ? ?Intake/Output Summary (Last 24 hours) at 04/28/2021 1350 ?Last data filed at 04/28/2021 1122 ?Gross per 24 hour  ?Intake 1324.49 ml  ?Output 5305 ml  ?Net -3980.51 ml  ? ? ?Filed Weights  ? 04/25/21 0433 04/26/21 0544 04/27/21 0426  ?Weight: 126.2 kg 123.4 kg 123.1 kg  ? ? ? ?Exam: ? ?General: Obese man in good spirits lying in bed in NAD ?Eyes: sclera anicteric, conjuctiva mild injection bilaterally ?CVS: S1-S2, regular  ?Respiratory: Reasonable air entry  ?GI/back: NABS, soft, NT, wound VAC in place ?LE: No edema.  ?Neuro: grossly nonfocal.  ?Psych: patient is logical and coherent, judgement and insight appear normal, mood and affect appropriate to situation. ? ? ?Assessment & Plan: ?  ?Fournier's gangrene ?S/p intraoperative I&D and debridement on 04/23/2021 ?Patient growing out pansensitive Enterococcus faecalis ?Changed from Vanco and Zosyn to high-dose Unasyn today antibiotic day #6 ?Patient seen by plastic surgery earlier today who noted t surgical  intervention was probably not going to be necessary to close scrotal edges.  It was treated with Steri-Strips to allow drainage.  Being followed closely by urology with intermittent I&D is warranted.. ? ?IDDM ?Blood sugars improved to 200-300s on on glargine 30  ?Increase glargine to 35 units ?Follow fingersticks and reevaluate tomorrow ?Patient is being followed by diabetes coordinator ? ?HTN ?BP improved with the addition of Toprol-XL 12.5 ?Increase Toprol-XL to 25 daily ?Continue lisinopril ? ?CAD ?No active disease at present ?Continue Lipitor and amlodipine ?Aspirin and Plavix are on hold given possible need for ongoing I&D/debridement ? ?h/o VTE/PE ?S/p IVC filter ?Xarelto was on hold ?Apparently allergic to heparin products possibly HIT ?Xarelto can be restarted upon discharge ? ? ? ?DVT prophylaxis: SCD ?Code Status: Full ?Family Communication: None at bedside, patient states not necessary to call ?Disposition Plan:  ? Patient is from: Home ? Anticipated Discharge Location: TBD ? Barriers to Discharge: Ongoing infection ? Is patient medically stable for Discharge: No ? ? ?Scheduled Meds: ? atorvastatin  80 mg Oral QHS  ? Chlorhexidine Gluconate Cloth  6 each Topical Daily  ? insulin aspart  0-15 Units Subcutaneous TID WC  ? insulin glargine-yfgn  30 Units Subcutaneous Daily  ? lisinopril  20 mg Oral Daily  ? metoprolol succinate  12.5 mg Oral Daily  ? nicotine  7 mg Transdermal Daily  ? senna-docusate  2 tablet Oral BID  ? ?Continuous Infusions: ? sodium chloride 100 mL/hr at 04/28/21 1116  ?  sodium chloride 10 mL/hr at 04/23/21 0941  ? ampicillin-sulbactam (UNASYN) IV 3 g (04/28/21 1119)  ? ? ?Data Reviewed: ? ?Basic Metabolic Panel: ?Recent Labs  ?Lab 04/23/21 ?0125 04/23/21 ?5465 04/23/21 ?1206 04/24/21 ?0356 04/25/21 ?0353 04/26/21 ?0430 04/27/21 ?0354 04/28/21 ?0416  ?NA  --  135  --  132* 137 137 136 136  ?K  --  4.3  --  4.5 3.9 4.1 4.0 4.3  ?CL  --  101  --  100 103 106 104 103  ?CO2  --  25  --  23  26 25 26 26   ?GLUCOSE  --  283*   < > 309* 235* 324* 216* 224*  ?BUN  --  11  --  11 10 6 6 10   ?CREATININE  --  0.83  --  0.81 0.83 0.76 0.71 0.82  ?CALCIUM  --  8.8*  --  8.5* 8.7* 8.7* 8.8* 8.8*  ?MG 1.9 1.8  --   --   --   --   --   --   ?PHOS 3.3 2.5  --   --   --   --   --   --   ? < > = values in this interval not displayed.  ? ? ? ?CBC: ?Recent Labs  ?Lab 04/24/21 ?0356 04/25/21 ?0353 04/26/21 ?0430 04/27/21 ?06/26/21 04/28/21 ?0416  ?WBC 17.0* 10.8* 10.6* 10.8* 11.4*  ?NEUTROABS 13.9* 7.0 7.6 7.6 8.2*  ?HGB 12.6* 13.1 13.1 13.4 13.8  ?HCT 36.5* 39.1 38.5* 39.1 41.2  ?MCV 86.7 88.1 87.7 87.5 88.2  ?PLT 246 287 291 302 320  ? ? ? ?Studies: ?No results found. ? ?Principal Problem: ?  Sepsis (HCC) ?Active Problems: ?  DM (diabetes mellitus), type 2 with complications (HCC) ?  Hypertension ?  Hyperlipidemia LDL goal <70 ?  DVT, lower extremity (HCC) ?  CAD S/P percutaneous coronary angioplasty ?  Scrotal infection ?  Tobacco abuse ? ? ? ? ?6568 06/28/21, ?[facility] ? ?If 7PM-7AM, please contact night-coverage ?[email or web address] ? ? LOS: 5 days  ? ?

## 2021-04-29 ENCOUNTER — Inpatient Hospital Stay (HOSPITAL_COMMUNITY): Payer: BC Managed Care – PPO

## 2021-04-29 DIAGNOSIS — E785 Hyperlipidemia, unspecified: Secondary | ICD-10-CM | POA: Diagnosis not present

## 2021-04-29 DIAGNOSIS — E669 Obesity, unspecified: Secondary | ICD-10-CM

## 2021-04-29 DIAGNOSIS — I10 Essential (primary) hypertension: Secondary | ICD-10-CM | POA: Diagnosis not present

## 2021-04-29 DIAGNOSIS — I2510 Atherosclerotic heart disease of native coronary artery without angina pectoris: Secondary | ICD-10-CM | POA: Diagnosis not present

## 2021-04-29 DIAGNOSIS — E118 Type 2 diabetes mellitus with unspecified complications: Secondary | ICD-10-CM | POA: Diagnosis not present

## 2021-04-29 DIAGNOSIS — M79671 Pain in right foot: Secondary | ICD-10-CM

## 2021-04-29 LAB — CBC WITH DIFFERENTIAL/PLATELET
Abs Immature Granulocytes: 0.08 10*3/uL — ABNORMAL HIGH (ref 0.00–0.07)
Basophils Absolute: 0 10*3/uL (ref 0.0–0.1)
Basophils Relative: 0 %
Eosinophils Absolute: 0.2 10*3/uL (ref 0.0–0.5)
Eosinophils Relative: 2 %
HCT: 40.3 % (ref 39.0–52.0)
Hemoglobin: 13.7 g/dL (ref 13.0–17.0)
Immature Granulocytes: 1 %
Lymphocytes Relative: 21 %
Lymphs Abs: 2.2 10*3/uL (ref 0.7–4.0)
MCH: 29.7 pg (ref 26.0–34.0)
MCHC: 34 g/dL (ref 30.0–36.0)
MCV: 87.2 fL (ref 80.0–100.0)
Monocytes Absolute: 0.9 10*3/uL (ref 0.1–1.0)
Monocytes Relative: 9 %
Neutro Abs: 7.4 10*3/uL (ref 1.7–7.7)
Neutrophils Relative %: 67 %
Platelets: 286 10*3/uL (ref 150–400)
RBC: 4.62 MIL/uL (ref 4.22–5.81)
RDW: 12.3 % (ref 11.5–15.5)
WBC: 10.9 10*3/uL — ABNORMAL HIGH (ref 4.0–10.5)
nRBC: 0 % (ref 0.0–0.2)

## 2021-04-29 LAB — GLUCOSE, CAPILLARY
Glucose-Capillary: 150 mg/dL — ABNORMAL HIGH (ref 70–99)
Glucose-Capillary: 225 mg/dL — ABNORMAL HIGH (ref 70–99)
Glucose-Capillary: 179 mg/dL — ABNORMAL HIGH (ref 70–99)
Glucose-Capillary: 235 mg/dL — ABNORMAL HIGH (ref 70–99)

## 2021-04-29 LAB — BASIC METABOLIC PANEL
Anion gap: 7 (ref 5–15)
BUN: 8 mg/dL (ref 6–20)
CO2: 24 mmol/L (ref 22–32)
Calcium: 8.9 mg/dL (ref 8.9–10.3)
Chloride: 105 mmol/L (ref 98–111)
Creatinine, Ser: 0.75 mg/dL (ref 0.61–1.24)
GFR, Estimated: >60 mL/min (ref >60)
Glucose, Bld: 105 mg/dL — ABNORMAL HIGH (ref 70–99)
Potassium: 4.1 mmol/L (ref 3.5–5.1)
Sodium: 136 mmol/L (ref 135–145)

## 2021-04-29 MED ORDER — RIVAROXABAN 20 MG PO TABS
20.0000 mg | ORAL_TABLET | Freq: Every day | ORAL | Status: DC
  Administered 2021-04-29 – 2021-04-30 (×2): 20 mg via ORAL
  Filled 2021-04-29 (×2): qty 1

## 2021-04-29 MED ORDER — HYDROCORTISONE 1 % EX CREA
TOPICAL_CREAM | Freq: Two times a day (BID) | CUTANEOUS | Status: DC
  Administered 2021-04-29: 1 via TOPICAL
  Filled 2021-04-29: qty 28

## 2021-04-29 MED ORDER — INSULIN STARTER KIT- PEN NEEDLES (ENGLISH)
1.0000 | Freq: Once | Status: DC
  Filled 2021-04-29: qty 1

## 2021-04-29 MED ORDER — HYDROCORTISONE 1 % EX LOTN
TOPICAL_LOTION | Freq: Two times a day (BID) | CUTANEOUS | Status: DC
  Filled 2021-04-29: qty 118

## 2021-04-29 NOTE — Assessment & Plan Note (Signed)
-  Smoking Cessation Counseling given  ?-C/w Nicotine Patch 7 mg TD q24h ?

## 2021-04-29 NOTE — Assessment & Plan Note (Addendum)
-  Has home anticoagulation of Xarelto and resume once okay from a Urology standpoint and this was done on 04/29/21 ?

## 2021-04-29 NOTE — TOC Progression Note (Addendum)
Transition of Care (TOC) - Progression Note  ? ? ?Patient Details  ?Name: Oneil Behney ?MRN: [id number] ?Date of Birth: Jul 20, 1970 ? ?Transition of Care (TOC) CM/SW Contact  ?Leone Haven, RN ?Phone Number: ?04/29/2021, 3:27 PM ? ?Clinical Narrative:    ?from home, indep, with  Fournier's gangrene, I and D done, conts on iv abx , he is set up with [facility] has first apt on 4/12 at 8 am. He does not have a caregiver at home that can help with dressing changes to his scrotum. He does not have a PCP , will make a follow up with one of the [facility]: [facility].  Vangie Bicker with [facility] will see if they can provide services for him and she will let this NCM know. Jen with [facility] will be able to take referral for [facility]. Soc on Saturday.   ? ? ?  ?  ? ?Expected Discharge Plan and Services ?  ?  ?  ?  ?  ?                ?  ?  ?  ?  ?  ?  ?  ?  ?  ?  ? ? ?Social Determinants of Health (SDOH) Interventions ?  ? ?Readmission Risk Interventions ?   ? View : No data to display.  ?  ?  ?  ? ? ?

## 2021-04-29 NOTE — Assessment & Plan Note (Addendum)
-  Continue with insulin glargine 35 units ?-Continue monitor CBGs per protocol continue with SSI ?-Continue monitor CBGs per protocol and patient's oral medications have been held including metformin 750 mg p.o. daily ?-Has uncontrolled diabetes mellitus type 2 as his hemoglobin A1c on 04/24/2018 was 10.7 ?-Diabetes education coronary consulted for further evaluation and assistance made recommendations for discharge including discharge insulin if patient accepts this lower upon my conversation today he is adamantly refusing any type of insulin and wants to go home on oral medications ?-CBGs now ranging from 149-235 ?

## 2021-04-29 NOTE — Assessment & Plan Note (Addendum)
-  Check X-Ray and showed "No acute osseous abnormality.  Degenerative changes." ?-MRI Done given persistance of pain and showed "Flexor and extensor tendons are intact. Mildly increased T2 signal within the intrinsic muscles of the forefoot, nonspecific, but likely related to diabetic muscle changes. Soft tissue Scattered mild soft tissue swelling without enhancement. No fluid collection or hematoma. No soft tissue mass." ?-PT/OT to Evaluate and Treat and recommending outpatient outpatient PT/OT and he was given exercises for Right Ankle and Foot Strengthening given his acute Pain in his foot ? ?

## 2021-04-29 NOTE — Assessment & Plan Note (Signed)
-  C/w Atorvastatin 80 mg po qHS ?

## 2021-04-29 NOTE — Evaluation (Signed)
Physical Therapy Evaluation ?Patient Details ?Name: Greg Quinn ?MRN: [id number] ?DOB: [birth year] ?Today's Date: 04/29/2021 ? ?History of Present Illness ? 51 y/o male presented to ED on 04/22/21 for increasing size of abscess on L testicle. Admitted with Fournier's gangrene. S/p I&D L scrotum on 3/30. PMH: hx of bilateral PE s/p IVC filter, DM2, HTN, CAD  ?Clinical Impression ? Patient admitted with above findings. Patient presents with weakness and pain in R foot. Patient with complaints of pain with weightbearing, palpation, and during inversion/eversion at the medial aspect of the R foot. Notified MD for request of further imaging. Patient at supervision level for ambulation with no AD but requires standing rest break due to pain. Patient does have flight of stairs to access bedroom in his townhouse. Patient will benefit from skilled PT services during acute stay to address listed deficits. Recommend OPPT to follow up with patient for general strengthening and to address R foot further.    ?   ? ?Recommendations for follow up therapy are one component of a multi-disciplinary discharge planning process, led by the attending physician.  Recommendations may be updated based on patient status, additional functional criteria and insurance authorization. ? ?Follow Up Recommendations Outpatient PT ? ?  ?Assistance Recommended at Discharge PRN  ?Patient can return home with the following ? Help with stairs or ramp for entrance ? ?  ?Equipment Recommendations None recommended by PT  ?Recommendations for Other Services ?    ?  ?Functional Status Assessment Patient has had a recent decline in their functional status and demonstrates the ability to make significant improvements in function in a reasonable and predictable amount of time.  ? ?  ?Precautions / Restrictions Precautions ?Precautions: Fall ?Precaution Comments: R foot pain ?Restrictions ?Weight Bearing Restrictions: No  ? ?  ? ?Mobility ? Bed  Mobility ?Overal bed mobility: Modified Independent ?  ?  ?  ?  ?  ?  ?  ?  ? ?Transfers ?Overall transfer level: Modified independent ?  ?  ?  ?  ?  ?  ?  ?  ?  ?  ? ?Ambulation/Gait ?Ambulation/Gait assistance: Supervision ?Gait Distance (Feet): 120 Feet ?Assistive device: None ?Gait Pattern/deviations: Step-through pattern, Decreased stance time - right, Decreased stride length, Decreased weight shift to right, Antalgic ?Gait velocity: decreased ?  ?  ?General Gait Details: supervision for safety. patient with standing rest break at 27' due to pain ? ?Stairs ?  ?  ?  ?  ?  ? ?Wheelchair Mobility ?  ? ?Modified Rankin (Stroke Patients Only) ?  ? ?  ? ?Balance Overall balance assessment: Mild deficits observed, not formally tested ?  ?  ?  ?  ?  ?  ?  ?  ?  ?  ?  ?  ?  ?  ?  ?  ?  ?  ?   ? ? ? ?Pertinent Vitals/Pain Pain Assessment ?Pain Assessment: Faces ?Faces Pain Scale: Hurts even more ?Pain Location: R foot ?Pain Descriptors / Indicators: Aching, Discomfort, Dull, Grimacing, Guarding ?Pain Intervention(s): Monitored during session  ? ? ?Home Living Family/patient expects to be discharged to:: Private residence ?Living Arrangements: Spouse/significant other ?Available Help at Discharge: Family ?Type of Home: House ?Home Access: Stairs to enter ?  ?Entrance Stairs-Number of Steps: 2 ?Alternate Level Stairs-Number of Steps: 18 ?Home Layout: Two level ?Home Equipment: None ?   ?  ?Prior Function Prior Level of Function : Independent/Modified Independent;Driving;Working/employed ?  ?  ?  ?  ?  ?  ?  ?  ?  ? ? ?  Hand Dominance  ?   ? ?  ?Extremity/Trunk Assessment  ? Upper Extremity Assessment ?Upper Extremity Assessment: Overall WFL for tasks assessed ?  ? ?Lower Extremity Assessment ?Lower Extremity Assessment: RLE deficits/detail ?RLE Deficits / Details: pain with palpation of medial aspect of R foot. Patient reports pain with weight bearing and inversion/eversion. Patient states "feels like you step on a big rock  with your shoes on". Radiograph negative for fx. Notified MD and requested MRI. Strength overall WFL ?  ? ?Cervical / Trunk Assessment ?Cervical / Trunk Assessment: Normal  ?Communication  ? Communication: No difficulties  ?Cognition Arousal/Alertness: Awake/alert ?Behavior During Therapy: [facility] for tasks assessed/performed ?Overall Cognitive Status: Within Functional Limits for tasks assessed ?  ?  ?  ?  ?  ?  ?  ?  ?  ?  ?  ?  ?  ?  ?  ?  ?  ?  ?  ? ?  ?General Comments   ? ?  ?Exercises    ? ?Assessment/Plan  ?  ?PT Assessment Patient needs continued PT services  ?PT Problem List Decreased strength;Decreased activity tolerance;Decreased range of motion;Decreased mobility ? ?   ?  ?PT Treatment Interventions DME instruction;Stair training;Gait training;Functional mobility training;Therapeutic activities;Therapeutic exercise;Balance training;Patient/family education   ? ?PT Goals (Current goals can be found in the Care Plan section)  ?Acute Rehab PT Goals ?Patient Stated Goal: to find out what's wrong with my right foot ?PT Goal Formulation: With patient ?Time For Goal Achievement: 05/13/21 ?Potential to Achieve Goals: Good ? ?  ?Frequency Min 2X/week ?  ? ? ?Co-evaluation   ?  ?  ?  ?  ? ? ?  ?AM-PAC PT "6 Clicks" Mobility  ?Outcome Measure Help needed turning from your back to your side while in a flat bed without using bedrails?: None ?Help needed moving from lying on your back to sitting on the side of a flat bed without using bedrails?: None ?Help needed moving to and from a bed to a chair (including a wheelchair)?: None ?Help needed standing up from a chair using your arms (e.g., wheelchair or bedside chair)?: None ?Help needed to walk in hospital room?: A Little ?Help needed climbing 3-5 steps with a railing? : A Little ?6 Click Score: 22 ? ?  ?End of Session   ?Activity Tolerance: Patient tolerated treatment well ?Patient left: in bed;with call bell/phone within reach ?Nurse Communication: Mobility status ?PT  Visit Diagnosis: Muscle weakness (generalized) (M62.81);Pain ?Pain - Right/Left: Right ?Pain - part of body: Ankle and joints of foot ?  ? ?Time: 3532-9924 ?PT Time Calculation (min) (ACUTE ONLY): 21 min ? ? ?Charges:   PT Evaluation ?$PT Eval Low Complexity: 1 Low ?  ?  ?   ? ? ?Rontavious Albright A. Dan Humphreys, PT, DPT ?Acute Rehabilitation Services ?Pager [phone] ?Office [phone] ? ? ?Sharri Loya A Jerime Arif ?04/29/2021, 5:08 PM ? ?

## 2021-04-29 NOTE — Progress Notes (Signed)
Mobility Specialist Progress Note: ? ? 04/29/21 1010  ?Mobility  ?Activity Ambulated independently in hallway  ?Level of Assistance Modified independent, requires aide device or extra time  ?Assistive Device Other (Comment) ?(IV Pole)  ?Distance Ambulated (ft) 240 ft  ?Activity Response Tolerated well  ?$Mobility charge 1 Mobility  ? ?Pt still with R foot pain this am. Otherwise asx, back in chair with all needs met.  ? ?Nelta Numbers ?Acute Rehab ?Phone: [phone] ?Office Phone: [phone] ? ?

## 2021-04-29 NOTE — Hospital Course (Addendum)
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  He was evaluated by urology and treated with intraoperative I&D and placed on IV vancomycin and Zosyn.  He grew out pansensitive E faecalis he was changed to high-dose Unasyn on 04/27/2026.  Urology is following and they consulted plastics for further evaluation and they feel that the I&D site is appear to be healing nicely and they do not feel that he needs any surgical intervention from plastic standpoint.  Blood sugars have been uncontrolled has been on insulin here and has to be adjusted.  Patient does not want to go home on [facility] and wants to continue his oral medications.  His main complaint is now right foot pain and weakness so we will further evaluate with an x-ray and have PT OT evaluate.  He has been set up with the [facility] wound care clinic on 05/06/2020 at 8 AM as he does not have a caregiver at home that can help him with dressing changes to the scrotum but will arrange Home Health nurse and they will come out on Saturday. ? ?ID has been consulted for Abx Duration and Diabetes Education Coordinator has seen the patient and made recommendations. Anticipate D/C in the AM.  ?

## 2021-04-29 NOTE — Plan of Care (Signed)
?  Problem: Education: ?Goal: Knowledge of General Education information will improve ?Description: Including pain rating scale, medication(s)/side effects and non-pharmacologic comfort measures ?Outcome: Progressing ?  ?Problem: Health Behavior/Discharge Planning: ?Goal: Ability to manage health-related needs will improve ?Outcome: Progressing ?  ?Problem: Clinical Measurements: ?Goal: Ability to maintain clinical measurements within normal limits will improve ?Outcome: Progressing ?Goal: Will remain free from infection ?Outcome: Progressing ?Goal: Diagnostic test results will improve ?Outcome: Progressing ?Goal: Cardiovascular complication will be avoided ?Outcome: Progressing ?  ?Problem: Activity: ?Goal: Risk for activity intolerance will decrease ?Outcome: Progressing ?  ?Problem: Coping: ?Goal: Level of anxiety will decrease ?Outcome: Progressing ?  ?Problem: Elimination: ?Goal: Will not experience complications related to bowel motility ?Outcome: Progressing ?Goal: Will not experience complications related to urinary retention ?Outcome: Progressing ?  ?Problem: Safety: ?Goal: Ability to remain free from injury will improve ?Outcome: Progressing ?  ?Problem: Skin Integrity: ?Goal: Risk for impaired skin integrity will decrease ?Outcome: Progressing ?  ?Problem: Fluid Volume: ?Goal: Hemodynamic stability will improve ?Outcome: Progressing ?  ?Problem: Clinical Measurements: ?Goal: Diagnostic test results will improve ?Outcome: Progressing ?Goal: Signs and symptoms of infection will decrease ?Outcome: Progressing ?  ?Problem: Respiratory: ?Goal: Ability to maintain adequate ventilation will improve ?Outcome: Progressing ?  ?

## 2021-04-29 NOTE — Progress Notes (Signed)
Healing well ?Clean wound/irrigate once or twice daily and change steristrips with compression making edges come together ?Restart blood thinners ?Will follow ?Have patient contact for outpt f/up ?

## 2021-04-29 NOTE — Progress Notes (Signed)
?PROGRESS NOTE ? ? ? ?Greg Quinn  ZHY:[id number] DOB: 1970/06/25 DOA: 04/22/2021 ?PCP: Pcp, No  ? 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  He was evaluated by urology and treated with intraoperative I&D and placed on IV vancomycin and Zosyn.  He grew out pansensitive E faecalis he was changed to high-dose Unasyn on 04/27/2026.  Urology is following and they consulted plastics for further evaluation and they feel that the I&D site is appear to be healing nicely and they do not feel that he needs any surgical intervention from plastic standpoint.  Blood sugars have been uncontrolled has been on insulin here and has to be adjusted.  [facility] and wants to continue his oral medications.  His main complaint is now right foot pain and weakness so we will further evaluate with an x-ray and have PT OT evaluate.  He has been set up with the [facility] wound care clinic on 05/06/2020 at 8 AM as he does not have a caregiver at home that can help him with dressing changes to the scrotum.  ? ? ?Assessment and Plan: ?DM (diabetes mellitus), type 2 with complications (HCC) ?- Continue with insulin glargine 35 units ?-Continue monitor CBGs per protocol continue with SSI ?-Continue monitor CBGs per protocol and patient's oral medications have been held including metformin 750 mg p.o. daily ?-Has uncontrolled diabetes mellitus type 2 as his hemoglobin A1c on 04/24/2018 was 10.7 ?-Diabetes education coronary consulted for further evaluation and assistance ? ?Hypertension ?- Blood pressure improved with the  addition of Toprol-XL 12.5 mg p.o. daily and this was increased to 25 mg p.o. daily ?-We will continue his home lisinopril ?-Continue to monitor blood pressures per protocol ?-Last blood pressure reading 150/97 ? ? ?Hyperlipidemia LDL goal <70 ?-C/w Atorvastatin 80 mg po qHS ? ?DVT, lower extremity (HCC) ?-Has home anticoagulation of Xarelto and resume once okay from a Urology standpoint ? ?Right foot pain ?-Check X-Ray and showed "No acute osseous abnormality.  Degenerative changes." ?-PT/OT to Evaluate and Treat ? ? ?Obesity (BMI 30-39.9) ?-Complicates overall prognosis and care ?-Estimated body mass index is 37.49 kg/m? as calculated from the following: ?  Height as of this encounter: 5' 10.5" (1.791 m). ?  Weight as of this encounter: 120.2 kg.  ?-Weight Loss and Dietary Counseling given ? ? ?Tobacco abuse ?-Smoking Cessation Counseling given  ?-C/w Nicotine Patch 7 mg TD q24h ? ?Scrotal infection ?Fournier's Gangrene ?-Seen by the urology team and he underwent emergent I&D of the scrotum and debridement of the necrotic tissue with irrigation Foley catheter ?-Wound culture was sent pansensitive Enterococcus faecalis ?-He was changed from IV vancomycin and Zosyn to the high dose of Unasyn yesterday and is currently on antibiotic day #7 ?-He is seen by plastic surgery who noted that there would be no surgical intervention from their standpoint and they recommend continuing Steri-Strips to allow drainage ?-Continuing follow-up with urology and will resume his anticoagulation per urology recommendations as he will likely not go back to the OR ?-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 ? ?CAD S/P  percutaneous coronary angioplasty ?- Patient underwent a left heart cath and stent placement in July 2022 ?-He was prior to admission he was on aspirin 81 mg p.o. daily, Plavix 75 mg p.o. daily and Lipitor 80 mg p.o. daily ?-This was held given that he may need to go to the OR again but will  continue statin and discussed with cardiology about resuming triple therapy once he is stable for resumption of his antiplatelets. ? ? ?DVT prophylaxis: SCDs Start: 04/23/21 0427 ? ?  Code Status: Full Code ?Family Communication: No family present at bedside  ? ?Disposition Plan:  ?Level of care: Progressive ?Status is: Inpatient ?Remains inpatient appropriate because: Needs Urology Clearance  ?  ?Consultants:  ?Urology  ?Plastic Surgery ? ?Procedures:  ?Incision and drainage of abscess of scrotum and debridement of necrotic tissue with irrigation and Foley catheter by Dr. Lorin Picket McDiarmid ? ?Antimicrobials:  ?Anti-infectives (From admission, onward)  ? ? Start     Dose/Rate Route Frequency Ordered Stop  ? 04/26/21 1100  Ampicillin-Sulbactam (UNASYN) 3 g in sodium chloride 0.9 % 100 mL IVPB       ? 3 g ?200 mL/hr over 30 Minutes Intravenous Every 6 hours 04/26/21 1006    ? 04/25/21 1800  vancomycin (VANCOREADY) IVPB 1500 mg/300 mL  Status:  Discontinued       ? 1,500 mg ?150 mL/hr over 120 Minutes Intravenous Every 12 hours 04/25/21 1421 04/26/21 1006  ? 04/23/21 1800  vancomycin (VANCOREADY) IVPB 1250 mg/250 mL  Status:  Discontinued       ? 1,250 mg ?166.7 mL/hr over 90 Minutes Intravenous Every 12 hours 04/23/21 0950 04/25/21 1421  ? 04/23/21 0400  vancomycin (VANCOREADY) IVPB 1250 mg/250 mL  Status:  Discontinued       ? 1,250 mg ?166.7 mL/hr over 90 Minutes Intravenous Every 8 hours 04/22/21 1926 04/23/21 0950  ? 04/22/21 2200  piperacillin-tazobactam (ZOSYN) IVPB 3.375 g  Status:  Discontinued       ? 3.375 g ?12.5 mL/hr over 240 Minutes Intravenous Every 8 hours 04/22/21 1859 04/26/21 1006  ? 04/22/21 1915  vancomycin (VANCOREADY) IVPB 1750 mg/350 mL       ? 1,750 mg ?175 mL/hr over 120 Minutes Intravenous  Once 04/22/21 1913 04/22/21 2300  ? ?  ?  ? ?Subjective: ?And examined at bedside and states that his pain is doing okay.  States that his right foot is hurting and states that he is having trouble  ambulating because of it.  No nausea or vomiting.  Happy that he does not need further surgical intervention for his scrotum.  Denies any lightheadedness or dizziness.  No other concerns or complaints at this time. ? ?Objective: ?Vitals:  ? 04/29/21 0513 04/29/21 0729 04/29/21 1051 04/29/21 1549  ?BP: 133/82 (!) 146/86 (!) 162/96 (!) 150/97  ?Pulse: 80 71 80 78  ?Resp: 19 18  17   ?Temp: 98.3 ?F (36.8 ?C) 98 ?F (36.7 ?C) 97.9 ?F (36.6 ?C) 98.1 ?F (36.7 ?C)  ?TempSrc: Oral Oral Oral Oral  ?SpO2: 91% 93% 99% 97%  ?Weight: 120.2 kg     ?Height:      ? ? ?Intake/Output Summary (Last 24 hours) at 04/29/2021 1617 ?Last data filed at 04/29/2021 1552 ?Gross per 24 hour  ?Intake 3381.87 ml  ?Output 3800 ml  ?Net -418.13 ml  ? ?Filed Weights  ? 04/26/21 0544 04/27/21 0426 04/29/21 0513  ?Weight: 123.4 kg 123.1 kg 120.2 kg  ? ?Examination: ?Physical Exam: ? ?Constitutional: WN/WD obese  African-American male currently no acute distress ?Respiratory: Diminished to auscultation bilaterally with coarse breath sounds, no wheezing, rales, rhonchi or crackles. Normal respiratory effort and patient is not tachypenic. No accessory muscle use.  ?Cardiovascular: RRR, no murmurs / rubs / gallops. S1 and S2 auscultated. No extremity edema. 2+ pedal pulses. No carotid bruits.  ?Abdomen: Soft, non-tender, Distended 2/2 body habitus. No masses palpated. Bowel sounds positive.  ?GU: Scrotal Incision noted with steri-strips  ?Musculoskeletal: No clubbing / cyanosis of digits/nails. No joint deformity upper and lower extremities. ?Neurologic: CN 2-12 grossly intact with no focal deficits. Romberg sign and cerebellar reflexes not assessed.  ?Psychiatric: Normal judgment and insight. Alert and oriented x 3. Normal mood and appropriate affect.  ? ?Data Reviewed: I have personally reviewed following labs and imaging studies ? ?CBC: ?Recent Labs  ?Lab 04/25/21 ?0353 04/26/21 ?0430 04/27/21 ?3785 04/28/21 ?0416 04/29/21 ?8850  ?WBC 10.8* 10.6* 10.8* 11.4*  10.9*  ?NEUTROABS 7.0 7.6 7.6 8.2* 7.4  ?HGB 13.1 13.1 13.4 13.8 13.7  ?HCT 39.1 38.5* 39.1 41.2 40.3  ?MCV 88.1 87.7 87.5 88.2 87.2  ?PLT 287 291 302 320 286  ? ?Basic Metabolic Panel: ?Recent Labs  ?Lab 04/23/21 ?0

## 2021-04-29 NOTE — Assessment & Plan Note (Addendum)
-   Blood pressure improved with the addition of Toprol-XL 12.5 mg p.o. daily and this was increased to 25 mg p.o. daily ?-We will continue his home lisinopril ?-Continue to monitor blood pressures per protocol ?-Last blood pressure reading was a little elevated at 172/82 ? ?

## 2021-04-29 NOTE — Assessment & Plan Note (Signed)
-  Complicates overall prognosis and care ?-Estimated body mass index is 37.49 kg/m? as calculated from the following: ?  Height as of this encounter: 5' 10.5" (1.791 m). ?  Weight as of this encounter: 120.2 kg.  ?-Weight Loss and Dietary Counseling given ? ?

## 2021-04-29 NOTE — Progress Notes (Signed)
Inpatient Diabetes Program Recommendations ? ?AACE/ADA: New Consensus Statement on Inpatient Glycemic Control (2015) ? ?Target Ranges:  Prepandial:   less than 140 mg/dL ?     Peak postprandial:   less than 180 mg/dL (1-2 hours) ?     Critically ill patients:  140 - 180 mg/dL  ? ?Lab Results  ?Component Value Date  ? GLUCAP 150 (H) 04/29/2021  ? HGBA1C 10.7 (H) 04/23/2021  ? ? ?Review of Glycemic Control ? Latest Reference Range & Units 04/27/21 11:53 04/27/21 16:34 04/27/21 21:01 04/28/21 06:22 04/28/21 11:41 04/28/21 16:12 04/28/21 21:12 04/29/21 06:02  ?Glucose-Capillary 70 - 99 mg/dL 186 (H) 244 (H) 267 (H) 211 (H) 210 (H) 199 (H) 183 (H) 150 (H)  ?(H): Data is abnormally high ? ?Diabetes history: Type 2 ?Outpatient Diabetes medications: Metformin 750 mg daily, Glucotrol 5 mg daily, Rybelsus 14 mg daily ?Current orders for Inpatient glycemic control: Semglee 35 units daily, Novolog MODERATE correction scale TID ? ?Inpatient Diabetes Program Recommendations:   ?Nurses, please start insulin teaching and allow patient to give own injections in the hospital to help prepare if discharged home on insulin. ?Insulin pen starter kit ordered and plan to discuss with patient today. ? ?Thank you, ?Nani Gasser Damonte Frieson, RN, MSN, CDE  ?Diabetes Coordinator ?Inpatient Glycemic Control Team ?Team Pager [phone] (8am-5pm) ?04/29/2021 10:47 AM ? ? ? ? ?

## 2021-04-29 NOTE — Assessment & Plan Note (Addendum)
-   Patient underwent a left heart cath and stent placement in July 2022 ?-He was prior to admission he was on aspirin 81 mg p.o. daily, Plavix 75 mg p.o. daily and Lipitor 80 mg p.o. daily ?-This was held given that he may need to go to the OR again but after further discussion with urology he does not need any further debridements and I spoke to his primary cardiologist Dr. Sallyanne Kuster who recommends discontinuing the aspirin and discontinuing Plavix at this time as he will be on Plavix and Xarelto ?

## 2021-04-29 NOTE — Assessment & Plan Note (Addendum)
Fournier's Gangrene ?-Seen by the urology team and he underwent emergent I&D of the scrotum and debridement of the necrotic tissue with irrigation Foley catheter ?-Wound culture was sent pansensitive Enterococcus faecalis ?-He was changed from IV vancomycin and Zosyn to the high dose of Unasyn yesterday and is currently on antibiotic day #8 ?-He is seen by plastic surgery who noted that there would be no surgical intervention from their standpoint and they recommend continuing Steri-Strips to allow drainage ?-Continuing follow-up with urology and will resume his anticoagulation per urology recommendations as he will likely not go back to the OR ?-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 ?

## 2021-04-29 NOTE — Consult Note (Addendum)
WOC consult for scrotum wound was requested by the primary physician prior to involvement by Urology and plastic surgery teams.  They are now following for assessment and plan of care, and have provided topical treatment orders for the bedside nurses to perform. No further role for WOC team at this time. ?Please re-consult if further assistance is needed.  Thank-you,  ?Cammie Mcgee MSN, RN, CWOCN, [place], CNS ?[phone]  ?

## 2021-04-30 ENCOUNTER — Inpatient Hospital Stay (HOSPITAL_COMMUNITY): Payer: BC Managed Care – PPO

## 2021-04-30 ENCOUNTER — Other Ambulatory Visit (HOSPITAL_COMMUNITY): Payer: Self-pay

## 2021-04-30 DIAGNOSIS — E785 Hyperlipidemia, unspecified: Secondary | ICD-10-CM | POA: Diagnosis not present

## 2021-04-30 DIAGNOSIS — I825Y9 Chronic embolism and thrombosis of unspecified deep veins of unspecified proximal lower extremity: Secondary | ICD-10-CM | POA: Diagnosis not present

## 2021-04-30 DIAGNOSIS — E118 Type 2 diabetes mellitus with unspecified complications: Secondary | ICD-10-CM | POA: Diagnosis not present

## 2021-04-30 DIAGNOSIS — I2510 Atherosclerotic heart disease of native coronary artery without angina pectoris: Secondary | ICD-10-CM | POA: Diagnosis not present

## 2021-04-30 LAB — COMPREHENSIVE METABOLIC PANEL
ALT: 35 U/L (ref 0–44)
AST: 23 U/L (ref 15–41)
Albumin: 3.1 g/dL — ABNORMAL LOW (ref 3.5–5.0)
Alkaline Phosphatase: 103 U/L (ref 38–126)
Anion gap: 5 (ref 5–15)
BUN: 8 mg/dL (ref 6–20)
CO2: 25 mmol/L (ref 22–32)
Calcium: 9.2 mg/dL (ref 8.9–10.3)
Chloride: 106 mmol/L (ref 98–111)
Creatinine, Ser: 0.85 mg/dL (ref 0.61–1.24)
GFR, Estimated: >60 mL/min (ref >60)
Glucose, Bld: 126 mg/dL — ABNORMAL HIGH (ref 70–99)
Potassium: 4.2 mmol/L (ref 3.5–5.1)
Sodium: 136 mmol/L (ref 135–145)
Total Bilirubin: 0.2 mg/dL — ABNORMAL LOW (ref 0.3–1.2)
Total Protein: 7.3 g/dL (ref 6.5–8.1)

## 2021-04-30 LAB — CBC WITH DIFFERENTIAL/PLATELET
Abs Immature Granulocytes: 0.07 10*3/uL (ref 0.00–0.07)
Basophils Absolute: 0 10*3/uL (ref 0.0–0.1)
Basophils Relative: 0 %
Eosinophils Absolute: 0.2 10*3/uL (ref 0.0–0.5)
Eosinophils Relative: 2 %
HCT: 42 % (ref 39.0–52.0)
Hemoglobin: 14.1 g/dL (ref 13.0–17.0)
Immature Granulocytes: 1 %
Lymphocytes Relative: 15 %
Lymphs Abs: 1.7 10*3/uL (ref 0.7–4.0)
MCH: 29.2 pg (ref 26.0–34.0)
MCHC: 33.6 g/dL (ref 30.0–36.0)
MCV: 87 fL (ref 80.0–100.0)
Monocytes Absolute: 0.9 10*3/uL (ref 0.1–1.0)
Monocytes Relative: 8 %
Neutro Abs: 8.1 10*3/uL — ABNORMAL HIGH (ref 1.7–7.7)
Neutrophils Relative %: 74 %
Platelets: 313 10*3/uL (ref 150–400)
RBC: 4.83 MIL/uL (ref 4.22–5.81)
RDW: 12.3 % (ref 11.5–15.5)
WBC: 10.9 10*3/uL — ABNORMAL HIGH (ref 4.0–10.5)
nRBC: 0 % (ref 0.0–0.2)

## 2021-04-30 LAB — MAGNESIUM: Magnesium: 1.9 mg/dL (ref 1.7–2.4)

## 2021-04-30 LAB — PHOSPHORUS: Phosphorus: 4.6 mg/dL (ref 2.5–4.6)

## 2021-04-30 LAB — GLUCOSE, CAPILLARY
Glucose-Capillary: 149 mg/dL — ABNORMAL HIGH (ref 70–99)
Glucose-Capillary: 184 mg/dL — ABNORMAL HIGH (ref 70–99)
Glucose-Capillary: 219 mg/dL — ABNORMAL HIGH (ref 70–99)
Glucose-Capillary: 226 mg/dL — ABNORMAL HIGH (ref 70–99)

## 2021-04-30 MED ORDER — GADOBUTROL 1 MMOL/ML IV SOLN
10.0000 mL | Freq: Once | INTRAVENOUS | Status: AC | PRN
  Administered 2021-04-30: 10 mL via INTRAVENOUS

## 2021-04-30 MED ORDER — CLOPIDOGREL BISULFATE 75 MG PO TABS
75.0000 mg | ORAL_TABLET | Freq: Every day | ORAL | Status: DC
  Administered 2021-04-30 – 2021-05-01 (×2): 75 mg via ORAL
  Filled 2021-04-30 (×2): qty 1

## 2021-04-30 NOTE — Plan of Care (Signed)
?  Problem: Education: ?Goal: Knowledge of General Education information will improve ?Description: Including pain rating scale, medication(s)/side effects and non-pharmacologic comfort measures ?Outcome: Progressing ?  ?Problem: Health Behavior/Discharge Planning: ?Goal: Ability to manage health-related needs will improve ?Outcome: Progressing ?  ?Problem: Clinical Measurements: ?Goal: Ability to maintain clinical measurements within normal limits will improve ?Outcome: Progressing ?Goal: Will remain free from infection ?Outcome: Progressing ?Goal: Diagnostic test results will improve ?Outcome: Progressing ?Goal: Cardiovascular complication will be avoided ?Outcome: Progressing ?  ?Problem: Activity: ?Goal: Risk for activity intolerance will decrease ?Outcome: Progressing ?  ?Problem: Coping: ?Goal: Level of anxiety will decrease ?Outcome: Progressing ?  ?Problem: Elimination: ?Goal: Will not experience complications related to bowel motility ?Outcome: Progressing ?Goal: Will not experience complications related to urinary retention ?Outcome: Progressing ?  ?Problem: Safety: ?Goal: Ability to remain free from injury will improve ?Outcome: Progressing ?  ?Problem: Skin Integrity: ?Goal: Risk for impaired skin integrity will decrease ?Outcome: Progressing ?  ?Problem: Fluid Volume: ?Goal: Hemodynamic stability will improve ?Outcome: Progressing ?  ?Problem: Clinical Measurements: ?Goal: Diagnostic test results will improve ?Outcome: Progressing ?Goal: Signs and symptoms of infection will decrease ?Outcome: Progressing ?  ?Problem: Respiratory: ?Goal: Ability to maintain adequate ventilation will improve ?Outcome: Progressing ?  ?

## 2021-04-30 NOTE — TOC Benefit Eligibility Note (Signed)
Patient Advocate Encounter ? ?Insurance verification completed.   ? ?The patient is currently admitted and upon discharge could be taking Levemir Flex Pen. ? ?The current 30 day co-pay is, $35.00.  ? ?The patient is insured through [employer]  ? ? ? ?Roland Earl, CPhT ?Pharmacy Patient Advocate Specialist ?[facility] Pharmacy Patient Advocate Team ?Direct Number: [phone]  Fax: [phone] ? ? ? ? ? ?  ?

## 2021-04-30 NOTE — TOC Transition Note (Addendum)
Transition of Care (TOC) - CM/SW Discharge Note ? ? ?Patient Details  ?Name: Greg Quinn ?MRN: [id number] ?Date of Birth: 1970-04-08 ? ?Transition of Care (TOC) CM/SW Contact:  ?Leone Haven, RN ?Phone Number: ?04/30/2021, 10:59 AM ? ? ?Clinical Narrative:    ?Patient is for dc, he is set up with [facility] for [facility] for wound care steri strips to scrotum area. Also he is set up with wound care clinic, apt on 4/12 at 8 am.  He will have transport home today.  Patient states he would like physical therapy to just give him the exercises that he can do at home and he will do them. He just wants the [facility] which may cost him 150.00 per visit.  He has a follow up apt at the [facility] clinic on 4/14, listed on AVS. ? ? ?Final next level of care: Home w Home Health Services ?Barriers to Discharge: No Barriers Identified ? ? ?Patient Goals and CMS Choice ?Patient states their goals for this hospitalization and ongoing recovery are:: return home ?CMS Medicare.gov Compare Post Acute Care list provided to:: Patient ?Choice offered to / list presented to : Patient ? ?Discharge Placement ?  ?           ?  ?  ?  ?  ? ?Discharge Plan and Services ?  ?  ?           ?  ?DME Agency: NA ?  ?  ?  ?HH Arranged: RN ?HH Agency: [facility] ?Date HH Agency Contacted: 04/30/21 ?Time HH Agency Contacted: 1058 ?Representative spoke with at [facility] Agency: Vangie Bicker ? ?Social Determinants of Health (SDOH) Interventions ?  ? ? ?Readmission Risk Interventions ?   ? View : No data to display.  ?  ?  ?  ? ? ? ? ? ?

## 2021-04-30 NOTE — Evaluation (Signed)
Occupational Therapy Evaluation ?Patient Details ?Name: Greg Quinn ?MRN: [id number] ?DOB: [birth year] ?Today's Date: 04/30/2021 ? ? ?History of Present Illness 51 y/o male presented to ED on 04/22/21 for increasing size of abscess on L testicle. Admitted with Fournier's gangrene. S/p I&D L scrotum on 3/30. PMH: hx of bilateral PE s/p IVC filter, DM2, HTN, CAD  ? ?Clinical Impression ?  ?Pt was admitted as above. He was assessed and then participated in ADL retraining session to include bathing while standing at sink, bed mobility, chair transfer and simulated tub/toilet transfer(s). Pt is overall independent - Mod I for all bathing, dressing (UB/LB), transfers as observed and performed today w/o the use of an AD. He continues to have c/o right foot pain, but states that he is waiting on results of pending tests that were performed earlier today. He has supportive family that will be able to assist him with household activities/cooking/cleaning etc. when he is d/c. He voiced concerns about how dressing changes will be performed after d/c and this was relayed to RN whom is aware. He denies any further actute OT needs at this time, will sign off.  ?   ? ?Recommendations for follow up therapy are one component of a multi-disciplinary discharge planning process, led by the attending physician.  Recommendations may be updated based on patient status, additional functional criteria and insurance authorization.  ? ?Follow Up Recommendations ? No OT follow up  ?  ?Assistance Recommended at Discharge None  ?Patient can return home with the following Assistance with cooking/housework;Assist for transportation;Direct supervision/assist for financial management;Help with stairs or ramp for entrance;Other (comment) (Assistance with dressing changes - pt's main concern) ? ?  ?Functional Status Assessment ? Patient has had a recent decline in their functional status and demonstrates the ability to make significant  improvements in function in a reasonable and predictable amount of time.  ?Equipment Recommendations ? None recommended by OT  ?  ?   ?Precautions / Restrictions Precautions ?Precautions: Fall ?Precaution Comments: R foot pain - pt is s/p MRI per his report  ? ?  ? ?Mobility Bed Mobility ?Overal bed mobility: Modified Independent ?  ?  ? ?Transfers ?Overall transfer level: Modified independent ?Equipment used: None ?  ?  ?General transfer comment: Pt reports that he has been ambulating independently in his room w/o AD. ?  ? ?  ?Balance Overall balance assessment: Mild deficits observed, not formally tested ?  ?   ? ?ADL either performed or assessed with clinical judgement  ? ?ADL Overall ADL's : Needs assistance/impaired ?Eating/Feeding: Independent;Sitting ?  ?Grooming: Wash/dry hands;Wash/dry face;Oral care;Applying deodorant;Standing;Modified independent ?  ?Upper Body Bathing: Modified independent;Standing ?Upper Body Bathing Details (indicate cue type and reason): Assist to wash back - pt uses a brush/sponge at home and reports Mod I ?Lower Body Bathing: Modified independent;Sit to/from stand ?  ?Upper Body Dressing : Independent;Standing ?  ?Lower Body Dressing: Modified independent;Sit to/from stand;Sitting/lateral leans ?  ?Toilet Transfer: Modified Independent;Ambulation ?Toilet Transfer Details (indicate cue type and reason): Simulated transfer from bed to sink and then chair ?Toileting- Clothing Manipulation and Hygiene: Modified independent;Sit to/from stand;Sitting/lateral lean ?  ?Tub/ Shower Transfer: Tub transfer;Modified independent;Ambulation ?Tub/Shower Transfer Details (indicate cue type and reason): Simulated - step up/marching ?Functional mobility during ADLs: Modified independent (w/o AD) ?General ADL Comments: Pt was assessed and participated in ADL retraining session to include bathing while standing at sink, bed mobility, chair transfer and simulated tub/toilet transfer(s). Pt is overall  independent - Mod I  for all bathing, dressing, transfers as observed and performed today w/o the use of an AD. He continues to have c/o right foot pain, but states that he is waiting on results of pending MRI that was performed earlier today. He has supportive family that will be able to assist him with household activities/cooking/cleaning etc. when he is d/c. He voiced concerns about how dressing changes will be performed after d/c and this was relayed to RN whom is aware. He denies any further actute OT needs at this time, will sign off.  ? ? ? ?Vision Patient Visual Report: No change from baseline ?   ?   ?   ?   ? ?Pertinent Vitals/Pain Pain Assessment ?Pain Assessment: Faces ?Faces Pain Scale: Hurts little more ?Pain Location: R foot ?Pain Descriptors / Indicators: Aching, Discomfort, Dull, Grimacing, Guarding ?Pain Intervention(s): Limited activity within patient's tolerance, Monitored during session, Repositioned, Premedicated before session  ? ? ? ?Hand Dominance Right ?  ?Extremity/Trunk Assessment Upper Extremity Assessment ?Upper Extremity Assessment: Overall WFL for tasks assessed ?  ?Lower Extremity Assessment ?Lower Extremity Assessment: Defer to PT evaluation ?  ?Cervical / Trunk Assessment ?Cervical / Trunk Assessment: Normal ?  ?Communication Communication ?Communication: No difficulties ?  ?Cognition Arousal/Alertness: Awake/alert ?Behavior During Therapy: [facility] for tasks assessed/performed ?Overall Cognitive Status: Within Functional Limits for tasks assessed ?  ?  ?  ?  ?General Comments  Pt with concerns about performing dressing changes when d/c home and R foot pain (RN was made aware of this). ? ?  ?   ?   ? ? ?Home Living Family/patient expects to be discharged to:: Private residence ?Living Arrangements: Spouse/significant other ?Available Help at Discharge: Family ?Type of Home: House ?Home Access: Stairs to enter ?Entrance Stairs-Number of Steps: 2 ?  ?Home Layout: Two level ?Alternate  Level Stairs-Number of Steps: 18 ?Alternate Level Stairs-Rails: Right ?Bathroom Shower/Tub: Tub/shower unit ?  ?Bathroom Toilet: Standard ?  ?  ?Home Equipment: Hand held shower head ?  ?  ?  ? ?  ?Prior Functioning/Environment Prior Level of Function : Independent/Modified Independent;Driving;Working/employed ?  ?   ? ?  ?  ?OT Problem List: Pain ?  ?   ?OT Treatment/Interventions:   None recommended, pt appears Mod I ADL's and self care tasks at this time. Has supportive family to assist with homemaking/cook/cleaning per his report. Pt denies any further needs. ?  ?OT Goals(Current goals can be found in the care plan section) Acute Rehab OT Goals ?Patient Stated Goal: Figure out what is wrong with this foot and how to do dressing changes when I go home ?OT Goal Formulation: All assessment and education complete, DC therapy ?Time For Goal Achievement: 04/30/21 ?Potential to Achieve Goals: Good  ?OT Frequency:  Eval only, sign off. ?  ? ?   ?AM-PAC OT "6 Clicks" Daily Activity     ?Outcome Measure Help from another person eating meals?: None ?Help from another person taking care of personal grooming?: None ?Help from another person toileting, which includes using toliet, bedpan, or urinal?: None ?Help from another person bathing (including washing, rinsing, drying)?: A Little (Assist to wash back only. Uses brush at home) ?Help from another person to put on and taking off regular upper body clothing?: None ?Help from another person to put on and taking off regular lower body clothing?: None ?6 Click Score: 23 ?  ?End of Session Equipment Utilized During Treatment:  (None) ?Nurse Communication: Mobility status;Other (comment) (Pt asking about dressing  changes at d/c as well as R foot pain. RN is aware and discussed with pt) ? ?Activity Tolerance: Patient tolerated treatment well ?Patient left: in chair;with call bell/phone within reach ? ?OT Visit Diagnosis: Pain;Unsteadiness on feet (R26.81) ?Pain - Right/Left:  Right ?Pain - part of body:  (R foot)  ?              ?Time: [id number] ?OT Time Calculation (min): 19 min ?Charges:  OT General Charges ?$OT Visit: 1 Visit ?OT Evaluation ?$OT Eval Low Complexity: 1 Low ?Carlynn Herald, Izear Pine B

## 2021-04-30 NOTE — Progress Notes (Signed)
Mobility Specialist Progress Note: ? ? 04/30/21 1210  ?Mobility  ?Activity Ambulated independently in hallway  ?Level of Assistance Independent  ?Assistive Device None  ?Distance Ambulated (ft) 470 ft  ?Activity Response Tolerated well  ?$Mobility charge 1 Mobility  ? ?Pt agreeable to mobility session. Ambulated independently in hallway with no AD. Decreased pain in foot today. Pt left sitting EOB with all needs met.  ? ?Nelta Numbers ?Acute Rehab ?Phone: [phone] ?Office Phone: [phone] ? ?

## 2021-04-30 NOTE — Progress Notes (Signed)
Physical Therapy Treatment & Discharge ?Patient Details ?Name: Greg Quinn ?MRN: [id number] ?DOB: Jul 29, 1970 ?Today's Date: 04/30/2021 ? ? ?History of Present Illness 51 y/o male presented to ED on 04/22/21 for increasing size of abscess on L testicle. Admitted with Fournier's gangrene. S/p I&D L scrotum on 3/30. PMH: hx of bilateral PE s/p IVC filter, DM2, HTN, CAD ? ?  ?PT Comments  ? ? Provided patient with HEP focused on R ankle/foot strengthening and stretching to assist with reducing pain in R foot. Provided level 2 and 3 theraband for patient to utilize for resistance. Patient appreciative. Encouraged ice/heat to help reduce pain/inflammation. D/c plan remains appropriate. No further skilled PT needs required acutely. Patient independent and met all goals. PT will sign off.    ?Recommendations for follow up therapy are one component of a multi-disciplinary discharge planning process, led by the attending physician.  Recommendations may be updated based on patient status, additional functional criteria and insurance authorization. ? ?Follow Up Recommendations ? Outpatient PT ?  ?  ?Assistance Recommended at Discharge PRN  ?Patient can return home with the following Help with stairs or ramp for entrance ?  ?Equipment Recommendations ? None recommended by PT  ?  ?Recommendations for Other Services   ? ? ?  ?Precautions / Restrictions Precautions ?Precautions: Fall ?Precaution Comments: R foot pain ?Restrictions ?Weight Bearing Restrictions: No  ?  ? ?Mobility ? Bed Mobility ?  ?  ?  ?  ?  ?  ?  ?General bed mobility comments: in chair on arrival ?  ? ?Transfers ?  ?  ?  ?  ?  ?  ?  ?  ?  ?  ?  ? ?Ambulation/Gait ?  ?  ?  ?  ?  ?  ?  ?  ? ? ?Stairs ?  ?  ?  ?  ?  ? ? ?Wheelchair Mobility ?  ? ?Modified Rankin (Stroke Patients Only) ?  ? ? ?  ?Balance   ?  ?  ?  ?  ?  ?  ?  ?  ?  ?  ?  ?  ?  ?  ?  ?  ?  ?  ?  ? ?  ?Cognition Arousal/Alertness: Awake/alert ?Behavior During Therapy: [facility] for tasks  assessed/performed ?Overall Cognitive Status: Within Functional Limits for tasks assessed ?  ?  ?  ?  ?  ?  ?  ?  ?  ?  ?  ?  ?  ?  ?  ?  ?  ?  ?  ? ?  ?Exercises Other Exercises ?Other Exercises: Provided patient with HEP handout which included seated heel/toe raises, towel scrunches, inversion/eversion with resistance bands, standing heel/toe raises, and single heel raises ? ?  ?General Comments   ?  ?  ? ?Pertinent Vitals/Pain Pain Assessment ?Pain Assessment: Faces ?Faces Pain Scale: Hurts little more ?Pain Location: R foot ?Pain Descriptors / Indicators: Aching, Discomfort, Dull, Grimacing, Guarding ?Pain Intervention(s): Monitored during session  ? ? ?Home Living   ?  ?  ?  ?  ?  ?  ?  ?  ?  ?   ?  ?Prior Function    ?  ?  ?   ? ?PT Goals (current goals can now be found in the care plan section) Acute Rehab PT Goals ?Patient Stated Goal: to find out what's wrong with my right foot ?PT Goal Formulation: All assessment and education complete, DC therapy ?Progress towards  PT goals: Progressing toward goals;Goals met/education completed, patient discharged from PT ? ?  ?Frequency ? ? ? Min 2X/week ? ? ? ?  ?PT Plan Current plan remains appropriate  ? ? ?Co-evaluation   ?  ?  ?  ?  ? ?  ?AM-PAC PT "6 Clicks" Mobility   ?Outcome Measure ? Help needed turning from your back to your side while in a flat bed without using bedrails?: None ?Help needed moving from lying on your back to sitting on the side of a flat bed without using bedrails?: None ?Help needed moving to and from a bed to a chair (including a wheelchair)?: None ?Help needed standing up from a chair using your arms (e.g., wheelchair or bedside chair)?: None ?Help needed to walk in hospital room?: None ?Help needed climbing 3-5 steps with a railing? : None ?6 Click Score: 24 ? ?  ?End of Session   ?  ?Patient left: in chair;with call bell/phone within reach;with family/visitor present ?Nurse Communication: Mobility status ?PT Visit Diagnosis: Muscle  weakness (generalized) (M62.81);Pain ?Pain - Right/Left: Right ?Pain - part of body: Ankle and joints of foot ?  ? ? ?Time: 8978-4784 ?PT Time Calculation (min) (ACUTE ONLY): 10 min ? ?Charges:  $Therapeutic Exercise: 8-22 mins          ?          ? ?Leean Amezcua A. Gilford Rile, PT, DPT ?Acute Rehabilitation Services ?Pager [phone] ?Office [phone] ? ? ? ?Tunya Held A Joneisha Miles ?04/30/2021, 3:09 PM ? ?

## 2021-04-30 NOTE — Progress Notes (Signed)
Spoke with nurse and dressing was changed ?Mobilizing with PT for foot ?See Urology in 7-10 days - I will call ?Call Urology if any issues  ?Continue with home antibiotics and wound dressing changes  ?

## 2021-04-30 NOTE — Progress Notes (Addendum)
Inpatient Diabetes Program Recommendations ? ?AACE/ADA: New Consensus Statement on Inpatient Glycemic Control (2015) ? ?Target Ranges:  Prepandial:   less than 140 mg/dL ?     Peak postprandial:   less than 180 mg/dL (1-2 hours) ?     Critically ill patients:  140 - 180 mg/dL  ? ?Lab Results  ?Component Value Date  ? GLUCAP 184 (H) 04/30/2021  ? HGBA1C 10.7 (H) 04/23/2021  ? ? ?Review of Glycemic Control ? ?Diabetes history: type 2 ?Outpatient Diabetes medications: Metformin XR 750 mg daily, Rybelsus ?Current orders for Inpatient glycemic control: Semglee 35 units daily, Novolog 0-15 units TID ? ?Inpatient Diabetes Program Recommendations:   ?Spoke with patient about the insulin pen. Gave patient demonstration on how to use and patient returned demo without issues. Staff RN had patient give his own insulin injection while talking with patient. He did well with administration. Patient continues to say that he hopes to be off all his medication in 60 days. Discussed hypoglycemia and how to treat.  ? ?Patient does work shift 2pm-11pm, so taking his basal insulin in the morning daily would work best for his work schedule. Recommend a basal insulin once a day (which ever brand of insulin works with his insurance) and then follow up with a PCP that he needs to get here in [place].  ? ?He will need prescription for home blood glucose meter kit at discharge (order #[id number]). Insurance benefit check is being done for insulin pen that is preferred, if patient is discharged on insulin. ? ?ADDENDUM: benefit check recommends Levemir with a $35.00 copay.  ? ?Levemir Flextouch insulin pen (order [id number]) ?Insulin pen needles (32 gauge X 4 mm  order # [id number]) ?Home blood glucose meter kit (order #[id number]) ? ? ?Harvel Ricks RN BSN CDE ?Diabetes Coordinator ?Pager: [phone]  8am-5pm  ? ? ? ? ? ?

## 2021-04-30 NOTE — Progress Notes (Signed)
?PROGRESS NOTE ? ? ? ?[place]  [id number] DOB: Nov 14, 1970 DOA: 04/22/2021 ?PCP: Pcp, No  ? 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  He was evaluated by urology and treated with intraoperative I&D and placed on IV vancomycin and Zosyn.  He grew out pansensitive E faecalis he was changed to high-dose Unasyn on 04/27/2026.  Urology is following and they consulted plastics for further evaluation and they feel that the I&D site is appear to be healing nicely and they do not feel that he needs any surgical intervention from plastic standpoint.  Blood sugars have been uncontrolled has been on insulin here and has to be adjusted.  Patient does not want to go home on [facility] and wants to continue his oral medications.  His main complaint is now right foot pain and weakness so we will further evaluate with an x-ray and have PT OT evaluate.  He has been set up with the [facility] wound care clinic on 05/06/2020 at 8 AM as he does not have a caregiver at home that can help him with dressing changes to the scrotum but will arrange Home Health nurse and they will come out on Saturday. ? ?ID has been consulted for Abx Duration and Diabetes Education Coordinator has seen the patient and made recommendations. Anticipate D/C in the AM.   ? ? ?Assessment and Plan: ?DM (diabetes mellitus), type 2 with complications ([place]) ?-Continue with insulin glargine 35 units ?-Continue monitor CBGs per protocol continue with SSI ?-Continue monitor CBGs per protocol and patient's oral medications have been held including metformin 750 mg p.o.  daily ?-Has uncontrolled diabetes mellitus type 2 as his hemoglobin A1c on 04/24/2018 was 10.7 ?-Diabetes education coronary consulted for further evaluation and assistance made recommendations for discharge including discharge insulin if patient accepts this lower upon my conversation today he is adamantly refusing any type of insulin and wants to go home on oral medications ?-CBGs now ranging from 149-235 ? ?Hypertension ?- Blood pressure improved with the addition of Toprol-XL 12.5 mg p.o. daily and this was increased to 25 mg p.o. daily ?-We will continue his home lisinopril ?-Continue to monitor blood pressures per protocol ?-Last blood pressure reading was a little elevated at 172/82 ? ? ?Hyperlipidemia LDL goal <70 ?-C/w Atorvastatin 80 mg po qHS ? ?DVT, lower extremity ([place]) ?-Has home anticoagulation of Xarelto and resume once okay from a Urology standpoint and this was done on 04/29/21 ? ?Right foot pain ?-Check X-Ray and showed "No acute osseous abnormality.  Degenerative changes." ?-MRI Done given persistance of pain and showed "Flexor and extensor tendons are intact. Mildly increased T2 signal within the intrinsic muscles of the forefoot, nonspecific, but likely related to diabetic muscle changes. Soft tissue Scattered mild soft tissue swelling without enhancement. No fluid collection or hematoma. No soft tissue mass." ?-PT/OT to Evaluate and Treat and recommending outpatient outpatient PT/OT and he was given exercises for Right Ankle and Foot Strengthening given his acute Pain in his foot ? ? ?Obesity (BMI 30-39.9) ?-Complicates overall prognosis and care ?-Estimated body mass index is 37.49 kg/m? as calculated from the following: ?  Height as of this  encounter: 5' 10.5" (1.791 m). ?  Weight as of this encounter: 120.2 kg.  ?-Weight Loss and Dietary Counseling given ? ? ?Tobacco abuse ?-Smoking Cessation Counseling given  ?-C/w Nicotine Patch 7 mg TD q24h ? ?Scrotal infection ?Fournier's Gangrene ?-Seen  by the urology team and he underwent emergent I&D of the scrotum and debridement of the necrotic tissue with irrigation Foley catheter ?-Wound culture was sent pansensitive Enterococcus faecalis ?-He was changed from IV vancomycin and Zosyn to the high dose of Unasyn yesterday and is currently on antibiotic day #8 ?-He is seen by plastic surgery who noted that there would be no surgical intervention from their standpoint and they recommend continuing Steri-Strips to allow drainage ?-Continuing follow-up with urology and will resume his anticoagulation per urology recommendations as he will likely not go back to the OR ?-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 ? ?CAD S/P percutaneous coronary angioplasty ?- Patient underwent a left heart cath and stent placement in July 2022 ?-He was prior to admission he was on aspirin 81 mg p.o. daily, Plavix 75 mg p.o. daily and Lipitor 80 mg p.o. daily ?-This was held given that he may need to go to the OR again but after further discussion with urology he does not need any further debridements and I spoke to his primary cardiologist Dr. Sallyanne Kuster who recommends discontinuing the aspirin and discontinuing Plavix at this time as he will be on Plavix and Xarelto ? ?DVT prophylaxis: SCDs Start: 04/23/21 0427 ?rivaroxaban (XARELTO) tablet 20 mg  ?  Code Status: Full Code ?Family Communication:  ? ?Disposition Plan:  ?Level of care: Progressive ?Status is: Inpatient ?Remains inpatient appropriate because: Needs further ID involvement for antibiotic duration recommendations ?  ?Consultants:  ?Urology ?Plastic Surgery ?ID ? ?Procedures:  ?Incision and drainage of abscess of scrotum and debridement of necrotic tissue with irrigation and Foley catheter by Dr. Nicki Reaper McDiarmid ? ?Antimicrobials:  ?Anti-infectives (From admission, onward)  ? ? Start     Dose/Rate Route  Frequency Ordered Stop  ? 04/26/21 1100  Ampicillin-Sulbactam (UNASYN) 3 g in sodium chloride 0.9 % 100 mL IVPB       ? 3 g ?200 mL/hr over 30 Minutes Intravenous Every 6 hours 04/26/21 1006    ? 04/25/21 1800  vancomycin (VANCOREADY) IVPB 1500 mg/300 mL  Status:  Discontinued       ? 1,500 mg ?150 mL/hr over 120 Minutes Intravenous Every 12 hours 04/25/21 1421 04/26/21 1006  ? 04/23/21 1800  vancomycin (VANCOREADY) IVPB 1250 mg/250 mL  Status:  Discontinued       ? 1,250 mg ?166.7 mL/hr over 90 Minutes Intravenous Every 12 hours 04/23/21 0950 04/25/21 1421  ? 04/23/21 0400  vancomycin (VANCOREADY) IVPB 1250 mg/250 mL  Status:  Discontinued       ? 1,250 mg ?166.7 mL/hr over 90 Minutes Intravenous Every 8 hours 04/22/21 1926 04/23/21 0950  ? 04/22/21 2200  piperacillin-tazobactam (ZOSYN) IVPB 3.375 g  Status:  Discontinued       ? 3.375 g ?12.5 mL/hr over 240 Minutes Intravenous Every 8 hours 04/22/21 1859 04/26/21 1006  ? 04/22/21 1915  vancomycin (VANCOREADY) IVPB 1750 mg/350 mL       ? 1,750 mg ?175 mL/hr over 120 Minutes Intravenous  Once 04/22/21 1913 04/22/21 2300  ? ?  ?  ?Subjective: ?Seen and examined at bedside  he is sitting in the chair and felt overall well.  States that he does not want to go home on any insulin.  Denies any chest pain or shortness breath.  Continues to have some pain in his foot and states is difficult for him to ambulate with the pain.  No nausea or vomiting.  No other concerns or complaints at this time. ? ?Objective: ?Vitals:  ? 04/30/21 0501 04/30/21 0722 04/30/21 1138 04/30/21 1552  ?BP: (!) 135/92 (!) 152/85 (!) 154/89 (!) 172/82  ?Pulse: 71 75 85 90  ?Resp: 19 20 20    ?Temp: 98.5 ?F (36.9 ?C) 98.1 ?F (36.7 ?C) 98.3 ?F (36.8 ?C) 98 ?F (36.7 ?C)  ?TempSrc: Oral Oral Oral Oral  ?SpO2: 95% 95% 95% 95%  ?Weight:      ?Height:      ? ? ?Intake/Output Summary (Last 24 hours) at 04/30/2021 1741 ?Last data filed at 04/30/2021 1553 ?Gross per 24 hour  ?Intake 1740.05 ml  ?Output 4325 ml  ?Net  -2584.95 ml  ? ?Filed Weights  ? 04/27/21 0426 04/29/21 0513 04/30/21 0131  ?Weight: 123.1 kg 120.2 kg 121.9 kg  ? ?Examination: ?Physical Exam: ? ?Constitutional: WN/WD obese male male seated in chair at bedside

## 2021-05-01 ENCOUNTER — Other Ambulatory Visit (HOSPITAL_COMMUNITY): Payer: Self-pay

## 2021-05-01 DIAGNOSIS — E1152 Type 2 diabetes mellitus with diabetic peripheral angiopathy with gangrene: Secondary | ICD-10-CM

## 2021-05-01 DIAGNOSIS — Z6838 Body mass index (BMI) 38.0-38.9, adult: Secondary | ICD-10-CM

## 2021-05-01 LAB — CBC WITH DIFFERENTIAL/PLATELET
Abs Immature Granulocytes: 0.05 10*3/uL (ref 0.00–0.07)
Basophils Absolute: 0 10*3/uL (ref 0.0–0.1)
Basophils Relative: 0 %
Eosinophils Absolute: 0.2 10*3/uL (ref 0.0–0.5)
Eosinophils Relative: 3 %
HCT: 39.4 % (ref 39.0–52.0)
Hemoglobin: 13.5 g/dL (ref 13.0–17.0)
Immature Granulocytes: 1 %
Lymphocytes Relative: 22 %
Lymphs Abs: 2.1 10*3/uL (ref 0.7–4.0)
MCH: 29.8 pg (ref 26.0–34.0)
MCHC: 34.3 g/dL (ref 30.0–36.0)
MCV: 87 fL (ref 80.0–100.0)
Monocytes Absolute: 0.9 10*3/uL (ref 0.1–1.0)
Monocytes Relative: 10 %
Neutro Abs: 6 10*3/uL (ref 1.7–7.7)
Neutrophils Relative %: 64 %
Platelets: 278 10*3/uL (ref 150–400)
RBC: 4.53 MIL/uL (ref 4.22–5.81)
RDW: 12.2 % (ref 11.5–15.5)
WBC: 9.3 10*3/uL (ref 4.0–10.5)
nRBC: 0 % (ref 0.0–0.2)

## 2021-05-01 LAB — BASIC METABOLIC PANEL
Anion gap: 5 (ref 5–15)
BUN: 9 mg/dL (ref 6–20)
CO2: 28 mmol/L (ref 22–32)
Calcium: 8.8 mg/dL — ABNORMAL LOW (ref 8.9–10.3)
Chloride: 103 mmol/L (ref 98–111)
Creatinine, Ser: 0.81 mg/dL (ref 0.61–1.24)
GFR, Estimated: >60 mL/min (ref >60)
Glucose, Bld: 275 mg/dL — ABNORMAL HIGH (ref 70–99)
Potassium: 4.1 mmol/L (ref 3.5–5.1)
Sodium: 136 mmol/L (ref 135–145)

## 2021-05-01 LAB — GLUCOSE, CAPILLARY
Glucose-Capillary: 204 mg/dL — ABNORMAL HIGH (ref 70–99)
Glucose-Capillary: 196 mg/dL — ABNORMAL HIGH (ref 70–99)

## 2021-05-01 LAB — MAGNESIUM: Magnesium: 1.9 mg/dL (ref 1.7–2.4)

## 2021-05-01 LAB — PHOSPHORUS: Phosphorus: 4 mg/dL (ref 2.5–4.6)

## 2021-05-01 MED ORDER — ALBUTEROL SULFATE HFA 108 (90 BASE) MCG/ACT IN AERS
1.0000 | INHALATION_SPRAY | Freq: Four times a day (QID) | RESPIRATORY_TRACT | 0 refills | Status: DC | PRN
  Filled 2021-05-01: qty 8.5, 14d supply, fill #0
  Filled 2021-05-01: qty 6.7, 16d supply, fill #0

## 2021-05-01 MED ORDER — HYDROCODONE-ACETAMINOPHEN 5-325 MG PO TABS
1.0000 | ORAL_TABLET | Freq: Three times a day (TID) | ORAL | 0 refills | Status: DC | PRN
  Filled 2021-05-01: qty 12, 4d supply, fill #0

## 2021-05-01 MED ORDER — INSULIN STARTER KIT- PEN NEEDLES (ENGLISH)
1.0000 | Freq: Once | 0 refills | Status: AC
  Filled 2021-05-01: qty 1, 1d supply, fill #0

## 2021-05-01 MED ORDER — AMOXICILLIN-POT CLAVULANATE 875-125 MG PO TABS
1.0000 | ORAL_TABLET | Freq: Two times a day (BID) | ORAL | 0 refills | Status: AC
  Filled 2021-05-01: qty 28, 14d supply, fill #0

## 2021-05-01 MED ORDER — BLOOD GLUCOSE MONITOR SYSTEM W/DEVICE KIT
PACK | 0 refills | Status: DC
  Filled 2021-05-01: qty 1, 30d supply, fill #0

## 2021-05-01 MED ORDER — METFORMIN HCL ER 750 MG PO TB24
750.0000 mg | ORAL_TABLET | Freq: Every day | ORAL | 0 refills | Status: DC
  Filled 2021-05-01: qty 30, 30d supply, fill #0

## 2021-05-01 MED ORDER — INSULIN PEN NEEDLE 32G X 4 MM MISC
1.0000 | Freq: Every day | 0 refills | Status: DC
Start: 2021-05-01 — End: 2022-01-11
  Filled 2021-05-01: qty 100, 30d supply, fill #0

## 2021-05-01 MED ORDER — METOPROLOL SUCCINATE ER 25 MG PO TB24
25.0000 mg | ORAL_TABLET | Freq: Every day | ORAL | 0 refills | Status: DC
Start: 2021-05-02 — End: 2021-05-08
  Filled 2021-05-01: qty 30, 30d supply, fill #0

## 2021-05-01 MED ORDER — ACCU-CHEK SOFTCLIX LANCETS MISC
0 refills | Status: DC
  Filled 2021-05-01: qty 100, 30d supply, fill #0

## 2021-05-01 MED ORDER — SENNOSIDES-DOCUSATE SODIUM 8.6-50 MG PO TABS
2.0000 | ORAL_TABLET | Freq: Every evening | ORAL | 0 refills | Status: DC | PRN
  Filled 2021-05-01: qty 30, 15d supply, fill #0

## 2021-05-01 MED ORDER — CLOPIDOGREL BISULFATE 75 MG PO TABS
75.0000 mg | ORAL_TABLET | Freq: Every day | ORAL | 0 refills | Status: DC
Start: 2021-05-01 — End: 2021-05-08
  Filled 2021-05-01: qty 30, 30d supply, fill #0

## 2021-05-01 MED ORDER — NICOTINE 7 MG/24HR TD PT24
7.0000 mg | MEDICATED_PATCH | Freq: Every day | TRANSDERMAL | 0 refills | Status: DC
  Filled 2021-05-01: qty 28, 28d supply, fill #0

## 2021-05-01 MED ORDER — XARELTO 20 MG PO TABS
20.0000 mg | ORAL_TABLET | Freq: Every day | ORAL | 0 refills | Status: DC
  Filled 2021-05-01: qty 30, 30d supply, fill #0

## 2021-05-01 MED ORDER — HYDROCORTISONE 1 % EX CREA
TOPICAL_CREAM | Freq: Two times a day (BID) | CUTANEOUS | 0 refills | Status: DC
  Filled 2021-05-01: qty 28, 7d supply, fill #0

## 2021-05-01 MED ORDER — GLUCOSE BLOOD VI STRP
ORAL_STRIP | 0 refills | Status: DC
  Filled 2021-05-01: qty 100, 25d supply, fill #0

## 2021-05-01 MED ORDER — INSULIN DETEMIR 100 UNIT/ML FLEXPEN
35.0000 [IU] | PEN_INJECTOR | Freq: Every day | SUBCUTANEOUS | 0 refills | Status: DC
  Filled 2021-05-01: qty 9, 25d supply, fill #0

## 2021-05-01 NOTE — Consult Note (Signed)
?  Greg Quinn for Infectious Disease  ? ? ?Date of Admission:  04/22/2021    ? ?Reason for Consult: Fournier's gangrene ?    ?Referring Physician: Dr. Alfredia Ferguson ? ?Current antibiotics: ?Unasyn ? ?ASSESSMENT:   ? ?51 y.o. male admitted with: ? ?Fournier's gangrene: Polymicrobial infection with cultures growing group B strep, staph lugdunensis, and Prevotella.  Patient is stable and improved on Unasyn.  Over the last 24 hours he has been afebrile and his leukocytosis has improved with antibiotics and debridement. ?Poorly controlled diabetes: Hemoglobin A1c this admission is 10.7. ?Obesity: Body mass index is 38.02 kg/m?Marland Kitchen  ?Severe sepsis: Present on admission and currently improved. ?Right foot pain: Patient states this started after his foot pain caused some mobility issues.  MRI this admit was negative except for mild arthritis and likely diabetic related muscle changes in the intrinsic muscles of forefoot. ?History of coronary artery disease status post PCI: July 2022. ? ?RECOMMENDATIONS:   ? ?Transition to Augmentin at discharge ?Plan for 2 additional weeks of therapy given extent of infection, poorly controlled diabetes, and obesity.  This will provide approximately 3 total weeks of therapy from his final debridement.  End date 05/14/2021 ?Stressed with patient the importance of wound care and glycemic control ?Please call with questions ? ? ?Principal Problem: ?  Sepsis ([place]) ?Active Problems: ?  DM (diabetes mellitus), type 2 with complications ([place]) ?  Hypertension ?  Hyperlipidemia LDL goal <70 ?  DVT, lower extremity ([place]) ?  CAD S/P percutaneous coronary angioplasty ?  Scrotal infection ?  Tobacco abuse ?  Obesity (BMI 30-39.9) ?  Right foot pain ? ? ?MEDICATIONS:   ? ?Scheduled Meds: ?? atorvastatin  80 mg Oral QHS  ?? Chlorhexidine Gluconate Cloth  6 each Topical Daily  ?? clopidogrel  75 mg Oral Daily  ?? hydrocortisone cream   Topical BID  ?? insulin aspart  0-15 Units Subcutaneous TID WC  ?? insulin  glargine-yfgn  35 Units Subcutaneous Daily  ?? insulin starter kit- pen needles  1 kit Other Once  ?? lisinopril  20 mg Oral Daily  ?? metoprolol succinate  25 mg Oral Daily  ?? nicotine  7 mg Transdermal Daily  ?? rivaroxaban  20 mg Oral Q supper  ?? senna-docusate  2 tablet Oral BID  ? ?Continuous Infusions: ?? sodium chloride 10 mL/hr at 04/23/21 0941  ?? ampicillin-sulbactam (UNASYN) IV 3 g (05/01/21 0617)  ? ?PRN Meds:.sodium chloride, acetaminophen **OR** acetaminophen, albuterol, HYDROcodone-acetaminophen, morphine injection, phenol ? ?HPI:   ? ?Greg Quinn is a 51 y.o. male with a past medical history significant for uncontrolled diabetes type 2, hypertension, hyperlipidemia, history of CAD, history of DVT/PE, tobacco use admitted 04/22/2021 with Fournier's gangrene.  Evaluated with urology and taken to the OR for operative I&D on 04/23/2021.  Cultures from the OR grew multiple organisms including group B strep, staph lugdunensis, and Prevotella.  Antibiotics were tailored from vancomycin and piperacillin/tazobactam down to ampicillin-sulbactam monotherapy which she has been on now for several days.  Urology has been following and plans for no further debridement.  He is continuing with wound care and nearing discharge.  Plastic surgery was also consulted for further evaluation and they felt the I&D site was healing well and did not need any further surgical intervention from a plastic surgery point of view.  We have been consulted for further antibiotic recommendations and duration of therapy. ? ? ?Past Medical History:  ?Diagnosis Date  ?? Bilateral pulmonary embolism (HCC)   ?  2009, on xarelto, IVC filter  ?? Coronary artery disease   ?? Current every day smoker   ? 25 pack year history  ?? DVT, lower extremity ([place])   ? 2009  ?? Hyperlipidemia LDL goal <70   ?? Hypertension   ?? NSTEMI (non-ST elevated myocardial infarction) ([place]) 08/20/2020  ?? NSTEMI (non-ST elevated myocardial infarction)  ([place]) 08/20/2020  ?? Uncontrolled diabetes mellitus   ? ? ?Social History  ? ?Tobacco Use  ?? Smoking status: Every Day  ?  Packs/day: 1.00  ?  Types: Cigarettes  ?? Smokeless tobacco: Never  ?Substance Use Topics  ?? Alcohol use: Yes  ?  Comment: occasional  ?? Drug use: Never  ? ? ?Family History  ?Problem Relation Age of Onset  ?? Diabetes Father   ? ? ?Allergies  ?Allergen Reactions  ?? Heparin Other (See Comments)  ?? Pork-Derived Products   ? ? ?Review of Systems  ?Constitutional:  Negative for chills and fever.  ?Respiratory: Negative.    ?Cardiovascular: Negative.   ?Gastrointestinal: Negative.   ?Genitourinary:   ?     Pain from surgery controlled.   ?Musculoskeletal:   ?     + foot pain  ?All other systems reviewed and are negative. ? ?OBJECTIVE:  ? ?Blood pressure (!) 158/73, pulse 67, temperature 98.4 ?F (36.9 ?C), temperature source Oral, resp. rate 20, height 5' 10.5" (1.791 m), weight 121.9 kg, SpO2 93 %. ?Body mass index is 38.02 kg/m?. ? ?Physical Exam ?Constitutional:   ?   General: He is not in acute distress. ?   Appearance: Normal appearance.  ?HENT:  ?   Head: Normocephalic and atraumatic.  ?   Right Ear: External ear normal.  ?   Left Ear: External ear normal.  ?   Nose: Nose normal.  ?Eyes:  ?   Extraocular Movements: Extraocular movements intact.  ?   Conjunctiva/sclera: Conjunctivae normal.  ?Pulmonary:  ?   Effort: Pulmonary effort is normal. No respiratory distress.  ?Abdominal:  ?   General: There is no distension.  ?   Palpations: Abdomen is soft.  ?Musculoskeletal:     ?   General: Normal range of motion.  ?   Cervical back: Normal range of motion and neck supple.  ?   Right lower leg: No edema.  ?   Left lower leg: No edema.  ?Skin: ?   General: Skin is warm and dry.  ?   Findings: No rash.  ?Neurological:  ?   General: No focal deficit present.  ?   Mental Status: He is alert and oriented to person, place, and time.  ?Psychiatric:     ?   Mood and Affect: Mood normal.     ?    Behavior: Behavior normal.  ? ? ? ?Lab Results: ?Lab Results  ?Component Value Date  ? WBC 9.3 05/01/2021  ? HGB 13.5 05/01/2021  ? HCT 39.4 05/01/2021  ? MCV 87.0 05/01/2021  ? PLT 278 05/01/2021  ?  ?Lab Results  ?Component Value Date  ? NA 136 05/01/2021  ? K 4.1 05/01/2021  ? CO2 28 05/01/2021  ? GLUCOSE 275 (H) 05/01/2021  ? BUN 9 05/01/2021  ? CREATININE 0.81 05/01/2021  ? CALCIUM 8.8 (L) 05/01/2021  ? GFRNONAA >60 05/01/2021  ?  ?Lab Results  ?Component Value Date  ? ALT 35 04/30/2021  ? AST 23 04/30/2021  ? ALKPHOS 103 04/30/2021  ? BILITOT 0.2 (L) 04/30/2021  ? ? ?No results found for:  CRP ? ?No results found for: ESRSEDRATE ? ?I have reviewed the micro and lab results in Epic. ? ?Imaging: ?MR FOOT RIGHT W WO CONTRAST ? ?Result Date: 04/30/2021 ?CLINICAL DATA:  Right foot pain. Recent admission for scrotal abscess/infection. EXAM: MRI OF THE RIGHT FOREFOOT WITHOUT AND WITH CONTRAST TECHNIQUE: Multiplanar, multisequence MR imaging of the right forefoot was performed before and after the administration of intravenous contrast. CONTRAST:  2m GADAVIST GADOBUTROL 1 MMOL/ML IV SOLN COMPARISON:  Right foot x-rays from yesterday. FINDINGS: Bones/Joint/Cartilage No marrow signal abnormality. No fracture or dislocation. Mild navicular-middle cuneiform osteoarthritis with small subchondral cysts/marrow edema in the lateral navicular. No joint effusion. Ligaments Collateral ligaments are intact.  Lisfranc ligament is intact. Muscles and Tendons Flexor and extensor tendons are intact. Mildly increased T2 signal within the intrinsic muscles of the forefoot, nonspecific, but likely related to diabetic muscle changes. Soft tissue Scattered mild soft tissue swelling without enhancement. No fluid collection or hematoma. No soft tissue mass. IMPRESSION: 1. No acute abnormality. 2. Mild navicular-middle cuneiform osteoarthritis. Electronically Signed   By: WTitus DubinM.D.   On: 04/30/2021 08:11  ? ?DG Foot Complete  Right ? ?Result Date: 04/29/2021 ?CLINICAL DATA:  Pain with weight-bearing. EXAM: RIGHT FOOT COMPLETE - 3+ VIEW COMPARISON:  None. FINDINGS: First MTP joint degenerative changes. Midfoot degenerative changes. No acute f

## 2021-05-01 NOTE — Progress Notes (Signed)
Mobility Specialist Progress Note: ? ? 05/01/21 0930  ?Mobility  ?Activity Ambulated independently in hallway  ?Level of Assistance Independent  ?Assistive Device Other (Comment) ?(IV Pole)  ?Distance Ambulated (ft) 470 ft  ?Activity Response Tolerated well  ?$Mobility charge 1 Mobility  ? ?Pt eager for mobility session. Back in chair with all needs met.  ? ?Greg Quinn ?Acute Rehab ?Phone: [phone] ?Office Phone: [phone] ? ?

## 2021-05-01 NOTE — Discharge Summary (Signed)
Physician Discharge Summary  ?Arenas Valleyhaerkahn Jose Jacques WUJ:[id number] DOB: 01-02-1971 DOA: 04/22/2021 ? ?PCP: Pcp, No ? ?Admit date: 04/22/2021 ?Discharge date: 05/01/2021 ? ?Admitted From: Home ?Disposition: Home with Home Health RN ? ?Recommendations for Outpatient Follow-up:  ?Follow up with PCP in 1-2 weeks ?Follow up with Urology Dr. Sherron MondayMacDiarmid within 1-2 weeks  ?Follow-up with orthopedic surgery in outpatient setting for right foot pain if continued ?Follow up with Cardiology in the outpatient setting  ?Please obtain CMP/CBC, Mag Phos in one week ?Please follow up on the following pending results: ? ?Home Health: Yes  ?Equipment/Devices: None   ? ?Discharge Condition: Stable  ?CODE STATUS: FULL CODE   ?Diet recommendation: Heart Healthy Carb Modified Diet ? ?Brief/Interim Summary: ?The patient is a 51 year old obese African-American male with a past medical history significant for but not limited to poorly controlled diabetes mellitus type 2, hypertension, hyperlipidemia history of CAD and NSTEMI on aspirin and Plavix, history of DVT and PE on Xarelto status post IVC filter as well as other comorbidities including everyday smoking who was admitted on 04/23/2018 with Fournier's gangrene.  He was evaluated by urology and treated with intraoperative I&D and placed on IV vancomycin and Zosyn.  He grew out pansensitive E faecalis he was changed to high-dose Unasyn on 04/27/2026.  Urology is following and they consulted plastics for further evaluation and they feel that the I&D site is appear to be healing nicely and they do not feel that he needs any surgical intervention from plastic standpoint.  Blood sugars have been uncontrolled has been on insulin here and has to be adjusted.  [facility] and wants to continue his oral medications.  His main complaint is now right foot pain and weakness so we will further evaluate with an x-ray and have PT OT evaluate.  [facility] at 8 AM as he does not have a caregiver at home that can help him with dressing changes to the scrotum but will arrange Home Health nurse and they will come out on Saturday. ?  ?ID has been consulted for Abx Duration and Diabetes Education Coordinator has seen the patient and made recommendations and he was changed to p.o. Augmentin for another 2 more weeks.  Patient improved and was deemed medically stable and home health nurse was arranged and patient will have his family member and friend come help him with the dressing changes.  He has agreed to insulin and will be discharged on Levemir 35 units subcu daily along with his metformin and other medication.  He will follow-up with establish with PCP and follow-up with urology in outpatient setting within 1 week as well as cardiology as he is due medically stable for ?  ? ?Discharge Diagnoses:  ?Principal Problem: ?  Sepsis (HCC) ?Active Problems: ?  DM (diabetes mellitus), type 2 with complications (HCC) ?  Hypertension ?  Hyperlipidemia LDL goal <70 ?  DVT, lower extremity (HCC) ?  CAD S/P percutaneous coronary angioplasty ?  Scrotal infection ?  Tobacco abuse ?  Obesity (BMI 30-39.9) ?  Right foot pain ? ?DM (diabetes mellitus), type 2 with complications (HCC) ?-Continue with insulin glargine 35 units and will continue insulin Levemir at discharge 35 units along with his other medications including metformin and others ?-Continue monitor CBGs per protocol continue with SSI ?-Continue monitor CBGs per protocol and patient's oral medications have been held including metformin 750  mg p.o. daily ?-Has uncontrolled diabetes mellitus type 2 as his hemoglobin A1c on 04/24/2018 was 10.7 ?-Diabetes education coronary consulted for further evaluation and assistance made recommendations for discharge including discharge insulin if patient accepts this lower upon my conversation today he is adamantly refusing any type of insulin and wants to  go home on oral medications ?-CBGs now ranging from 196-226 ?  ?Hypertension ?- Blood pressure improved with the addition of Toprol-XL 12.5 mg p.o. daily and this was increased to 25 mg p.o. daily ?-We will continue his home lisinopril ?-Continue to monitor blood pressures per protocol ?-Last blood pressure reading was a little elevated at 158/73 ?  ?  ?Hyperlipidemia LDL goal <70 ?-C/w Atorvastatin 80 mg po qHS ?  ?DVT, lower extremity (HCC) ?-Has home anticoagulation of Xarelto and resume once okay from a Urology standpoint and this was done on 04/29/21 ?  ?Right foot pain ?-Check X-Ray and showed "No acute osseous abnormality.  Degenerative changes." ?-MRI Done given persistance of pain and showed "Flexor and extensor tendons are intact. Mildly increased T2 signal within the intrinsic muscles of the forefoot, nonspecific, but likely related to diabetic muscle changes. Soft tissue Scattered mild soft tissue swelling without enhancement. No fluid collection or hematoma. No soft tissue mass." ?-PT/OT to Evaluate and Treat and recommending outpatient outpatient PT/OT and he was given exercises for Right Ankle and Foot Strengthening given his acute Pain in his foot ?-Follow-up in outpatient setting with orthopedic surgery if necessary ?  ?Obesity ?-Complicates overall prognosis and care ?-Estimated body mass index is 38.02 kg/m? as calculated from the following: ?  Height as of this encounter: 5' 10.5" (1.791 m). ?  Weight as of this encounter: 121.9 kg.  ?-Weight Loss and Dietary Counseling given ?  ?Tobacco abuse ?-Smoking Cessation Counseling given  ?-C/w Nicotine Patch 7 mg TD q24h ?  ?Scrotal infection ?Fournier's Gangrene ?-Seen by the urology team and he underwent emergent I&D of the scrotum and debridement of the necrotic tissue with irrigation Foley catheter ?-Wound culture was sent pansensitive Enterococcus faecalis ?-He was changed from IV vancomycin and Zosyn to the high dose of Unasyn yesterday and is  currently on antibiotic day #8 ?-He is seen by plastic surgery who noted that there would be no surgical intervention from their standpoint and they recommend continuing Steri-Strips to allow drainage ?-Continuing follow-up with urology and will resume his anticoagulation per urology recommendations as he will likely not go back to the OR ?-Not on heparin given history of HIT but resumed home AC ?-WBC went from 11.4 -> 10.9 x2 and improved to 9.3 ?-ID consulted for further Abx Management and recc's for Duration and they recommend changing to Augmentin for 14 more days for ?-Wound care nurse and home health RN arranged inpatient wound care appointment next week as well as RN coming tomorrow for dressing changes ?  ? ?CAD S/P percutaneous coronary angioplasty ?- Patient underwent a left heart cath and stent placement in July 2022 ?-He was prior to admission he was on aspirin 81 mg p.o. daily, Plavix 75 mg p.o. daily and Lipitor 80 mg p.o. daily ?-This was held given that he may need to go to the OR again but after further discussion with urology he does not need any further debridements and I spoke to his primary cardiologist Dr. Royann Shivers who recommends discontinuing the aspirin and discontinuing Plavix at this time as he will be on Plavix and Xarelto ?-Follow-up with cardiology outpatient setting ?  ? ?Discharge  Instructions ? ?Discharge Instructions   ? ? Call MD for:  difficulty breathing, headache or visual disturbances   Complete by: As directed ?  ? Call MD for:  extreme fatigue   Complete by: As directed ?  ? Call MD for:  hives   Complete by: As directed ?  ? Call MD for:  persistant dizziness or light-headedness   Complete by: As directed ?  ? Call MD for:  persistant nausea and vomiting   Complete by: As directed ?  ? Call MD for:  redness, tenderness, or signs of infection (pain, swelling, redness, odor or green/yellow discharge around incision site)   Complete by: As directed ?  ? Call MD for:  severe  uncontrolled pain   Complete by: As directed ?  ? Call MD for:  temperature >100.4   Complete by: As directed ?  ? Diet - low sodium heart healthy   Complete by: As directed ?  ? Diet Carb Modified   Complet

## 2021-05-06 ENCOUNTER — Encounter (HOSPITAL_BASED_OUTPATIENT_CLINIC_OR_DEPARTMENT_OTHER): Payer: BC Managed Care – PPO | Attending: General Surgery | Admitting: Physician Assistant

## 2021-05-06 DIAGNOSIS — L98492 Non-pressure chronic ulcer of skin of other sites with fat layer exposed: Secondary | ICD-10-CM | POA: Diagnosis not present

## 2021-05-06 DIAGNOSIS — E11622 Type 2 diabetes mellitus with other skin ulcer: Secondary | ICD-10-CM | POA: Diagnosis not present

## 2021-05-06 DIAGNOSIS — E1152 Type 2 diabetes mellitus with diabetic peripheral angiopathy with gangrene: Secondary | ICD-10-CM | POA: Insufficient documentation

## 2021-05-06 DIAGNOSIS — I10 Essential (primary) hypertension: Secondary | ICD-10-CM | POA: Diagnosis not present

## 2021-05-06 DIAGNOSIS — N493 Fournier gangrene: Secondary | ICD-10-CM | POA: Insufficient documentation

## 2021-05-06 DIAGNOSIS — T8189XA Other complications of procedures, not elsewhere classified, initial encounter: Secondary | ICD-10-CM | POA: Diagnosis not present

## 2021-05-08 ENCOUNTER — Ambulatory Visit: Payer: BC Managed Care – PPO | Attending: Internal Medicine | Admitting: Internal Medicine

## 2021-05-08 ENCOUNTER — Encounter: Payer: Self-pay | Admitting: Internal Medicine

## 2021-05-08 ENCOUNTER — Telehealth (HOSPITAL_COMMUNITY): Payer: Self-pay

## 2021-05-08 ENCOUNTER — Other Ambulatory Visit (HOSPITAL_COMMUNITY): Payer: Self-pay

## 2021-05-08 VITALS — BP 143/88 | HR 85 | Resp 16 | Ht 70.5 in | Wt 269.2 lb

## 2021-05-08 DIAGNOSIS — E669 Obesity, unspecified: Secondary | ICD-10-CM | POA: Diagnosis not present

## 2021-05-08 DIAGNOSIS — Z7689 Persons encountering health services in other specified circumstances: Secondary | ICD-10-CM

## 2021-05-08 DIAGNOSIS — I152 Hypertension secondary to endocrine disorders: Secondary | ICD-10-CM

## 2021-05-08 DIAGNOSIS — I10 Essential (primary) hypertension: Secondary | ICD-10-CM

## 2021-05-08 DIAGNOSIS — E1169 Type 2 diabetes mellitus with other specified complication: Secondary | ICD-10-CM | POA: Diagnosis not present

## 2021-05-08 DIAGNOSIS — Z87891 Personal history of nicotine dependence: Secondary | ICD-10-CM

## 2021-05-08 DIAGNOSIS — N493 Fournier gangrene: Secondary | ICD-10-CM

## 2021-05-08 DIAGNOSIS — R195 Other fecal abnormalities: Secondary | ICD-10-CM

## 2021-05-08 DIAGNOSIS — I2510 Atherosclerotic heart disease of native coronary artery without angina pectoris: Secondary | ICD-10-CM | POA: Diagnosis not present

## 2021-05-08 DIAGNOSIS — E1159 Type 2 diabetes mellitus with other circulatory complications: Secondary | ICD-10-CM

## 2021-05-08 DIAGNOSIS — Z86711 Personal history of pulmonary embolism: Secondary | ICD-10-CM

## 2021-05-08 DIAGNOSIS — N492 Inflammatory disorders of scrotum: Secondary | ICD-10-CM

## 2021-05-08 LAB — GLUCOSE, POCT (MANUAL RESULT ENTRY): POC Glucose: 237 mg/dl — AB (ref 70–99)

## 2021-05-08 MED ORDER — METOPROLOL SUCCINATE ER 25 MG PO TB24: 25.0000 mg | ORAL_TABLET | Freq: Every day | ORAL | 3 refills | Status: DC

## 2021-05-08 MED ORDER — INSULIN DETEMIR 100 UNIT/ML FLEXPEN: 35.0000 [IU] | PEN_INJECTOR | Freq: Every day | SUBCUTANEOUS | 5 refills | Status: DC

## 2021-05-08 MED ORDER — ATORVASTATIN CALCIUM 80 MG PO TABS: 80.0000 mg | ORAL_TABLET | Freq: Every day | ORAL | 4 refills | Status: DC

## 2021-05-08 MED ORDER — METFORMIN HCL ER 750 MG PO TB24: 750.0000 mg | ORAL_TABLET | Freq: Two times a day (BID) | ORAL | 3 refills | Status: DC

## 2021-05-08 MED ORDER — HYDROCODONE-ACETAMINOPHEN 5-325 MG PO TABS: 1.0000 | ORAL_TABLET | Freq: Two times a day (BID) | ORAL | 0 refills | Status: DC | PRN

## 2021-05-08 MED ORDER — XARELTO 20 MG PO TABS: 20.0000 mg | ORAL_TABLET | Freq: Every day | ORAL | 4 refills | Status: DC

## 2021-05-08 MED ORDER — CLOPIDOGREL BISULFATE 75 MG PO TABS: 75.0000 mg | ORAL_TABLET | Freq: Every day | ORAL | 6 refills | Status: DC

## 2021-05-08 MED ORDER — LISINOPRIL 20 MG PO TABS: 20.0000 mg | ORAL_TABLET | Freq: Every day | ORAL | 1 refills | Status: DC

## 2021-05-08 NOTE — Progress Notes (Signed)
? ? ?Patient ID: Greg Quinn, male    DOB: 02/22/1970  MRN: [id number] ? ?CC: Hospitalization Follow-up ? ? ?Subjective: ?Greg Quinn is a 51 y.o. 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 ? ?Pt to est care. ?Previous PCP was with [facility].  Decided to change after 1.5 yrs with them because we are closer to him. ? ?Patient hospitalized 3/29-05/01/2021 with Fournier's gangrene of LT scrotum.  ?Treated with intraoperative I&D by urology and placed on IV antibiotics.  Culture pansensitive E. Faecalis.  Seen by plastic surgeon whose assessment was that he does not need any further surgical intervention.  ID recommended change to Augmentin x 14 days.  Plan for patient to follow-up with wound care as an outpatient.  Also arranged home RN visit.   ?-Blood sugars were uncontrolled.  Started on insulin.  Complained of right foot pain for which plain x-rays showed no acute osseous process but some degenerative changes.  MRI revealed no acute abnormality with incidental finding of mild navicular-middle cuneiform OA. ? ?Today: ?Fournier's gangrene: Reports compliance with taking Augmentin.  Has 6 days left.  Saw wound care 2 days ago.  Told to continue packing the wound daily.  Has follow-up appointment.  Was called by RN from [facility] and reports being told that they do not come out unless the person is homebound which he is not. ?Still has some pain in the groin area.  No fever.  Wants to know if he can get something stronger for pain.  Has several hydrocodone's left. ?He needs to call to schedule follow-up appointment with urologist Dr. Matilde Sprang.  ? ?HTN/CAD: No chest pains or shortness of breath.  Cardiologist is Dr. Sallyanne Kuster but looks like he has not seen him since his catheterization in July of last year. ?Compliant with taking Toprol  25 mg, lisinopril 20 mg, atorvastatin 80 mg and Plavix.  Also on Xarelto for history of PE/DVT. ?Denies any bruising.  Sometimes noticed a little blood in the mucus when he blows his nose.  He attributes this from working in a dusty environment.  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  He does wear a mask while on duty. ? ?DM: Recent A1c 10.7.  Previously on metformin and Rybelsus.  Rybelsus discontinued on recent hospital discharge.  Started on Levemir 35 units daily instead. ?Compliant with insulin and metformin. ?Checks blood sugars twice a day.  Range before breakfast 125-178, after lunch 175-220. ?Does okay with eating habits.  Drinks mainly water and makes fruit/veggie smoothie daily.  Eats red meat about twice a month otherwise it is fish, chicken or [place].  Incorporates fruits and vegetables daily. ? ?Tobacco dependence: He has quit since hospitalization.  Prior to that, he was smoking on and off for 20 years. ? ?HM: Last eye exam 06/2020.  Reports no retinopathy.  Positive Cologuard test 09/2020.  Was supposed to have a colonoscopy but has not done so as yet. ? ?Patient Active Problem List  ? Diagnosis Date Noted  ? Former smoker 05/09/2021  ? Positive colorectal cancer screening using Cologuard test 05/09/2021  ? Obesity (BMI 30-39.9) 04/29/2021  ? Right foot pain 04/29/2021  ? Sepsis ([place]) 04/23/2021  ? CAD S/P percutaneous coronary angioplasty 04/23/2021  ? Scrotal infection 04/23/2021  ? Tobacco abuse 04/23/2021  ?  DM (diabetes mellitus), type 2 with complications ([place]) 37/62/8315  ? Hypertension 08/20/2020  ? Hyperlipidemia LDL goal <70 08/20/2020  ? DVT, lower extremity ([place]) 08/20/2020  ? Current every day smoker 08/20/2020  ? Bilateral pulmonary embolism ([place]) 08/20/2020  ? Unstable angina ([place]) 08/20/2020  ?  ? ?Current Outpatient Medications on File Prior to Visit  ?Medication Sig Dispense Refill  ? Accu-Chek Softclix Lancets lancets Use up to 4 times daily as directed 100 each 0   ? albuterol (VENTOLIN HFA) 108 (90 Base) MCG/ACT inhaler Inhale 1-2 puffs into the lungs 4 (four) times daily as needed for wheezing or shortness of breath. 6.7 g 0  ? amoxicillin-clavulanate (AUGMENTIN) 875-125 MG tablet Take 1 tablet by mouth every 12 (twelve) hours for 14 days. 28 tablet 0  ? Ascorbic Acid (VITAMIN C) 1000 MG tablet Take 1,000 mg by mouth 2 (two) times daily.    ? Blood Glucose Monitoring Suppl (BLOOD GLUCOSE MONITOR SYSTEM) w/Device KIT Use as directed 1 kit 0  ? diclofenac Sodium (VOLTAREN) 1 % GEL Apply 4 g topically 4 (four) times daily. (Patient taking differently: Apply 4 g topically 3 (three) times daily.) 100 g 0  ? glucose blood test strip Use up to 4 times daily 100 each 0  ? hydrocortisone cream 1 % Apply topically 2 (two) times daily. 28 g 0  ? Insulin Pen Needle 32G X 4 MM MISC use as directed daily with insulin 100 each 0  ? nitroGLYCERIN (NITROSTAT) 0.4 MG SL tablet Place 1 tablet (0.4 mg total) under the tongue every 5 (five) minutes as needed for chest pain. 20 tablet 1  ? ?No current facility-administered medications on file prior to visit.  ? ? ?Allergies  ?Allergen Reactions  ? Heparin Other (See Comments)  ? Pork-Derived Products   ? ? ?Social History  ? ?Socioeconomic History  ? Marital status: Single  ?  Spouse name: Not on file  ? Number of children: Not on file  ? Years of education: Not on file  ? Highest education level: Not on file  ?Occupational History  ? Not on file  ?Tobacco Use  ? Smoking status: Former  ?  Packs/day: 1.00  ?  Types: Cigarettes  ?  Quit date: 04/22/2021  ?  Years since quitting: 0.0  ? Smokeless tobacco: Never  ?Substance and Sexual Activity  ? Alcohol use: Yes  ?  Comment: occasional  ? Drug use: Never  ? Sexual activity: Not on file  ?Other Topics Concern  ? Not on file  ?Social History Narrative  ? Not on file  ? ?Social Determinants of Health  ? ?Financial Resource Strain: Not on file  ?Food Insecurity: Not on file  ?Transportation Needs: Not  on file  ?Physical Activity: Not on file  ?Stress: Not on file  ?Social Connections: Not on file  ?Intimate Partner Violence: Not on file  ? ? ?Family History  ?Problem Relation Age of Onset  ? Diabetes Father   ? ? ?Past Surgical History:  ?Procedure Laterality Date  ? CORONARY STENT INTERVENTION N/A 08/20/2020  ? Procedure: CORONARY STENT INTERVENTION;  Surgeon: Jettie Booze, MD;  Location: [place] [facility];  Service: Cardiovascular;  Laterality: N/A;  ? CYSTOSCOPY N/A 04/23/2021  ? Procedure: CYSTOSCOPY;  Surgeon: Bjorn Loser, MD;  Location: [place];  Service: Urology;  Laterality: N/A;  ? INCISION AND DRAINAGE ABSCESS N/A 04/23/2021  ? Procedure: INCISION AND DRAINAGE ABSCESS;  Surgeon: Bjorn Loser, MD;  Location: [facility]  OR;  Service: Urology;  Laterality: N/A;  ? INTRAVASCULAR ULTRASOUND/IVUS N/A 08/20/2020  ? Procedure: Intravascular Ultrasound/IVUS;  Surgeon: Jettie Booze, MD;  Location: [place] [facility];  Service: Cardiovascular;  Laterality: N/A;  ? LEFT HEART CATH AND CORONARY ANGIOGRAPHY N/A 08/20/2020  ? Procedure: LEFT HEART CATH AND CORONARY ANGIOGRAPHY;  Surgeon: Jettie Booze, MD;  Location: [place] [facility];  Service: Cardiovascular;  Laterality: N/A;  ? ? ?ROS: ?Review of Systems ?Negative except as stated above ? ?PHYSICAL EXAM: ?BP (!) 143/88   Pulse 85   Resp 16   Ht 5' 10.5" (1.791 m)   Wt 269 lb 3.2 oz (122.1 kg)   SpO2 95%   BMI 38.08 kg/m?   ?Wt Readings from Last 3 Encounters:  ?05/08/21 269 lb 3.2 oz (122.1 kg)  ?04/30/21 268 lb 11.9 oz (121.9 kg)  ?11/09/20 273 lb (123.8 kg)  ?Repeat blood pressure 158/107 ? ?Physical Exam ? ? ?General appearance - alert, well appearing, middle-age male and in no distress ?Mental status - normal mood, behavior, speech, dress, motor activity, and thought processes ?Eyes - pink conjunctiva ?Mouth - mucous membranes moist, pharynx normal without lesions ?Neck - supple, no significant adenopathy ?Chest - clear to  auscultation, no wheezes, rales or rhonchi, symmetric air entry ?Heart - normal rate, regular rhythm, normal S1, S2, no murmurs, rubs, clicks or gallops ?GU: Patient declines having me look at his scrotum. ?Extremit

## 2021-05-08 NOTE — Progress Notes (Signed)
Greg, Quinn ([id number]) ?Visit Report for 05/06/2021 ?Chief Complaint Document Details ?Patient Name: Date of Service: ?Greg Brownie ERKA HN J. 05/06/2021 8:00 A M ?Medical Record Number: [id number] ?Patient Account Number: [id number] ?Date of Birth/Sex: Treating RN: ?24-Feb-1970 (51 y.o. Greg Quinn ?Primary Care Provider: PCP, NO Other Clinician: ?Referring Provider: ?Treating Provider/Extender: Lenda Kelp ?Weeks in Treatment: 0 ?Information Obtained from: Patient ?Chief Complaint ?Scrotal Fournier gangrene ?Electronic Signature(s) ?Signed: 05/06/2021 9:47:18 AM By: Lenda Kelp PA-C ?Entered By: Lenda Kelp on 05/06/2021 09:47:18 ?-------------------------------------------------------------------------------- ?HPI Details ?Patient Name: Date of Service: ?Greg Brownie ERKA HN J. 05/06/2021 8:00 A M ?Medical Record Number: [id number] ?Patient Account Number: [id number] ?Date of Birth/Sex: Treating RN: ?07-Mar-1970 (51 y.o. Greg Quinn ?Primary Care Provider: PCP, NO Other Clinician: ?Referring Provider: ?Treating Provider/Extender: Lenda Kelp ?Weeks in Treatment: 0 ?History of Present Illness ?HPI Description: 05-06-2021 upon evaluation today patient appears to be doing somewhat poorly in regard to an area over the scrotal area where he did have ?unfortunately a a small area of abscess which subsequently developed into 40 gangrene. Subsequently he ended up having emergency incision and drainage ?at the emergency room and then subsequently was placed in the hospital for monitoring and antibiotics. Before discharge they discontinued packing and just ?had him put Steri-Strips over the area. With that being said I do not think that is going to be appropriate from a long-term perspective. It was also documented ?in the note that the patient did have a hemoglobin A1c of 10. He also was put on IV insulin in the hospital. This all began on 25 March when he thought he just ?had  an ingrown hair. There was a culture performed and did show group B strep and staph but not MRSA. He has been on Augmentin he has another 8 days of ?that as well to continue at this point. He is not having as much pain as it was in the beginning but is still kind of leery which is completely understandable. ?Electronic Signature(s) ?Signed: 05/06/2021 9:48:23 AM By: Lenda Kelp PA-C ?Entered By: Lenda Kelp on 05/06/2021 09:48:22 ?-------------------------------------------------------------------------------- ?Physical Exam Details ?Patient Name: Date of Service: ?Greg Brownie ERKA HN J. 05/06/2021 8:00 A M ?Medical Record Number: [id number] ?Patient Account Number: [id number] ?Date of Birth/Sex: Treating RN: ?1970/09/18 (51 y.o. Greg Quinn ?Primary Care Provider: PCP, NO Other Clinician: ?Referring Provider: ?Treating Provider/Extender: Lenda Kelp ?Weeks in Treatment: 0 ?Constitutional ?Well-nourished and well-hydrated in no acute distress. ?Respiratory ?normal breathing without difficulty. ?Psychiatric ?this patient is able to make decisions and demonstrates good insight into disease process. Alert and Oriented x 3. pleasant and cooperative. ?Notes ?Upon inspection patient's wound bed actually showed signs of pretty good granulation in my opinion. There was minimal slough and biofilm noted. Overall I ?think that the biggest issue I see is that he would do better with likely a Dakin's or Vashe wet-to-dry packing and then just Steri-Strips over this area. I still think ?that is good to be completely effective. ?Electronic Signature(s) ?Signed: 05/06/2021 9:48:58 AM By: Lenda Kelp PA-C ?Entered By: Lenda Kelp on 05/06/2021 09:48:57 ?-------------------------------------------------------------------------------- ?Physician Orders Details ?Patient Name: Date of Service: ?Greg Brownie ERKA HN J. 05/06/2021 8:00 A M ?Medical Record Number: [id number] ?Patient Account Number:  [id number] ?Date of Birth/Sex: Treating RN: ?1970/03/18 (51 y.o. Greg Quinn ?Primary Care Provider: PCP, NO Other Clinician: ?Referring Provider: ?Treating Provider/Extender: Larina Bras  III, Maury Bamba ?Weeks in Treatment: 0 ?Verbal / Phone Orders: No ?Diagnosis Coding ?ICD-10 Coding ?Code Description ?N49.3 Fournier gangrene ?L98.492 Non-pressure chronic ulcer of skin of other sites with fat layer exposed ?E11.622 Type 2 diabetes mellitus with other skin ulcer ?I10 Essential (primary) hypertension ?Follow-up Appointments ?ppointment in 1 week. - w/ Aldona Lento, RN Room # 9 ?Return A ?Bathing/ Shower/ Hygiene ?May shower with protection but do not get wound dressing(s) wet. ?Home Health ?New wound care orders this week; continue Home Health for wound care. May utilize formulary equivalent dressing for wound treatment ?orders unless otherwise specified. ?Wound Treatment ?Wound #1 - Scrotum ?Cleanser: Soap and Water 1 x Per Day/7 Days ?Discharge Instructions: May shower and wash wound with dial antibacterial soap and water prior to dressing change. ?Cleanser: Wound Cleanser (DME) (Generic) 1 x Per Day/7 Days ?Discharge Instructions: Cleanse the wound with wound cleanser prior to applying a clean dressing using gauze sponges, not tissue or cotton balls. ?Cleanser: Vashe 5.8 (oz) (DME) (Generic) 1 x Per Day/7 Days ?Discharge Instructions: Moisten 4 x 4 gauze with the vashe and pack into wound!! ?Prim Dressing: vashe soaked gauze (Generic) 1 x Per Day/7 Days ?ary ?Secondary Dressing: Woven Gauze Sponge, Non-Sterile 4x4 in (DME) (Generic) 1 x Per Day/7 Days ?Discharge Instructions: Apply over primary dressing as directed. ?Secondary Dressing: allevyn 5x5 silicone foam border (DME) (Generic) 1 x Per Day/7 Days ?Electronic Signature(s) ?Signed: 05/06/2021 2:06:11 PM By: Lenda Kelp PA-C ?Signed: 05/08/2021 12:54:41 PM By: Fonnie Mu RN ?Entered By: Fonnie Mu on 05/06/2021  09:12:19 ?-------------------------------------------------------------------------------- ?Problem List Details ?Patient Name: ?Date of Service: ?Greg Brownie ERKA HN J. 05/06/2021 8:00 A M ?Medical Record Number: [id number] ?Patient Account Number: [id number] ?Date of Birth/Sex: ?Treating RN: ?Mar 28, 1970 (51 y.o. Greg Quinn ?Primary Care Provider: PCP, NO ?Other Clinician: ?Referring Provider: ?Treating Provider/Extender: Lenda Kelp ?Weeks in Treatment: 0 ?Active Problems ?ICD-10 ?Encounter ?Code Description Active Date MDM ?Diagnosis ?N49.3 Fournier gangrene 05/06/2021 No Yes ?X32.355 Non-pressure chronic ulcer of skin of other sites with fat layer exposed 05/06/2021 No Yes ?E11.622 Type 2 diabetes mellitus with other skin ulcer 05/06/2021 No Yes ?I10 Essential (primary) hypertension 05/06/2021 No Yes ?Inactive Problems ?Resolved Problems ?Electronic Signature(s) ?Signed: 05/06/2021 9:02:02 AM By: Lenda Kelp PA-C ?Entered By: Lenda Kelp on 05/06/2021 09:02:02 ?-------------------------------------------------------------------------------- ?Progress Note Details ?Patient Name: ?Date of Service: ?Greg Brownie ERKA HN J. 05/06/2021 8:00 A M ?Medical Record Number: [id number] ?Patient Account Number: [id number] ?Date of Birth/Sex: ?Treating RN: ?Jul 16, 1970 (51 y.o. Greg Quinn ?Primary Care Provider: PCP, NO ?Other Clinician: ?Referring Provider: ?Treating Provider/Extender: Lenda Kelp ?Weeks in Treatment: 0 ?Subjective ?Chief Complaint ?Information obtained from Patient ?Scrotal Fournier gangrene ?History of Present Illness (HPI) ?05-06-2021 upon evaluation today patient appears to be doing somewhat poorly in regard to an area over the scrotal area where he did have unfortunately a a ?small area of abscess which subsequently developed into 40 gangrene. Subsequently he ended up having emergency incision and drainage at the emergency ?room and then subsequently was placed in the  hospital for monitoring and antibiotics. Before discharge they discontinued packing and just had him put Steri- ?Strips over the area. With that being said I do not think that is going to be appropriate from a long-term perspective. It was also do

## 2021-05-08 NOTE — Telephone Encounter (Signed)
Pharmacy Transitions of Care Follow-up Telephone Call ? ?Date of discharge: 05/01/21  ?Discharge Diagnosis: Sepsis/DVT ? ?How have you been since you were released from the hospital? Patient has been doing well since leaving the hospital. Patient does complain about all over itching which is only sometimes relieved by hydrocortisone cream. I advised the patient to bring the issue up with his MD at his appointment this afternoon as there are some PO meds to help with itching. Otherwise patient did not have any questions or concerns about his medications. ? ?Medication changes made at discharge: ?START taking these medications ? ?START taking these medications  ?Accu-Chek Guide test strip ?Generic drug: glucose blood ?Use up to 4 times daily  ?Accu-Chek Guide w/Device Kit ?Use as directed  ?Accu-Chek Softclix Lancets lancets ?Use up to 4 times daily as directed  ?amoxicillin-clavulanate 875-125 MG tablet ?Commonly known as: Augmentin ?Take 1 tablet by mouth every 12 (twelve) hours for 14 days.  ?BD Pen Needle Nano U/F 32G X 4 MM Misc ?Generic drug: Insulin Pen Needle ?use as directed daily with insulin  ?HYDROcodone-acetaminophen 5-325 MG tablet ?Commonly known as: NORCO/VICODIN ?Take 1 tablet by mouth 3 (three) times daily as needed for moderate pain.  ?Hydrocortisone Max St 1 % ?Generic drug: hydrocortisone cream ?Apply topically 2 (two) times daily.  ?Levemir FlexPen 100 UNIT/ML FlexPen ?Generic drug: insulin detemir ?Inject 35 Units into the skin daily.  ?nicotine 7 mg/24hr patch ?Commonly known as: NICODERM CQ - dosed in mg/24 hr ?Place 1 patch (7 mg total) onto the skin daily.  ?Senexon-S 8.6-50 MG tablet ?Generic drug: senna-docusate ?Take 2 tablets by mouth at bedtime as needed for mild constipation.  ? ?CHANGE how you take these medications ? ?CHANGE how you take these medications  ?atorvastatin 80 MG tablet ?Commonly known as: LIPITOR ?Take 1 tablet (80 mg total) by mouth daily. ?What changed: when to take  this  ?diclofenac Sodium 1 % Gel ?Commonly known as: VOLTAREN ?Apply 4 g topically 4 (four) times daily. ?What changed: when to take this  ?metFORMIN 750 MG 24 hr tablet ?Commonly known as: GLUCOPHAGE-XR ?Take 1 tablet (750 mg total) by mouth daily with lunch. ?What changed: Another medication with the same name was removed. Continue taking this medication, and follow the directions you see here.  ?metoprolol succinate 25 MG 24 hr tablet ?Commonly known as: TOPROL-XL ?Take 1 tablet (25 mg total) by mouth daily. ?What changed: how much to take  ? ?CONTINUE taking these medications ? ?CONTINUE taking these medications  ?acetaminophen 500 MG tablet ?Commonly known as: TYLENOL  ?albuterol 108 (90 Base) MCG/ACT inhaler ?Commonly known as: VENTOLIN HFA ?Inhale 1-2 puffs into the lungs 4 (four) times daily as needed for wheezing or shortness of breath.  ?clopidogrel 75 MG tablet ?Commonly known as: PLAVIX ?Take 1 tablet (75 mg total) by mouth daily.  ?lisinopril 20 MG tablet ?Commonly known as: ZESTRIL ?Take 1 tablet (20 mg total) by mouth daily.  ?nitroGLYCERIN 0.4 MG SL tablet ?Commonly known as: Nitrostat ?Place 1 tablet (0.4 mg total) under the tongue every 5 (five) minutes as needed for chest pain.  ?Rybelsus 14 MG Tabs ?Generic drug: Semaglutide  ?vitamin C 1000 MG tablet  ?Xarelto 20 MG Tabs tablet ?Generic drug: rivaroxaban ?Take 1 tablet (20 mg total) by mouth daily.  ? ?STOP taking these medications ? ?STOP taking these medications  ?amLODipine 5 MG tablet ?Commonly known as: NORVASC  ?aspirin EC 81 MG tablet  ?doxycycline 100 MG capsule ?Commonly known as:  VIBRAMYCIN  ?ondansetron 8 MG disintegrating tablet ?Commonly known as: Zofran ODT  ?pantoprazole 40 MG tablet ?Commonly known as: Protonix  ? ? ?Medication changes verified by the patient? Yes  ? ?Medication Accessibility: ? ?Home Pharmacy: [facility]  ? ?Was the patient provided with refills on discharged medications? no  ? ?Have all prescriptions been  transferred from [facility] to home pharmacy? N/a  ? ?Is the patient able to afford medications? Has insurance/established with [employer] [facility] ?Notable copays: n/a ?Eligible patient assistance: no ?  ? ?Medication Review: ?RIVAROXABAN (XARELTO)  ?Rivaroxaban 20 mg BID (PTA MED)  ?- Discussed importance of taking medication with food and around the same time everyday  ?- Reviewed potential DDIs with patient  ?- Advised patient of medications to avoid (NSAIDs, ASA)  ?- Educated that Tylenol (acetaminophen) will be the preferred analgesic to prevent risk of bleeding  ?- Emphasized importance of monitoring for signs and symptoms of bleeding (abnormal bruising, prolonged bleeding, nose bleeds, bleeding from gums, discolored urine, black tarry stools)  ?- Advised patient to alert all providers of anticoagulation therapy prior to starting a new medication or having a procedure  ? ?CLOPIDOGREL (PLAVIX) ?Clopidogrel 75mg  once daily. (PTA MED) ?- Educated patient on expected duration of therapy with clopidogrel. Advised patient that aspirin will be continued indefinitely.  ?- Reviewed potential DDIs with patient  ?- Advised patient of medications to avoid (NSAIDs, ASA)  ?- Educated that Tylenol (acetaminophen) will be the preferred analgesic to prevent risk of bleeding  ?- Emphasized importance of monitoring for signs and symptoms of bleeding (abnormal bruising, prolonged bleeding, nose bleeds, bleeding from gums, discolored urine, black tarry stools)  ?- Advised patient to alert all providers of anticoagulation therapy prior to starting a new medication or having a procedure  ? ?Follow-up Appointments: ? Date Visit Type Length Department  ? 05/08/2021  1:30 PM HOSPITAL FU 20 min [place] [[id number]]  ?Patient Instructions:   ?Please arrive 15 minutes prior to your appointment.  ?      ? 05/13/2021  9:30 AM WOUND CARE 45 45 min [facility] [place] [[id number]]  ? ? ?If  their condition worsens, is the pt aware to call PCP or go to the Emergency Dept.? Yes ? ?Final Patient Assessment: ?Patient has been doing well since leaving the hospital. Patient does complain about all over itching which is only sometimes relieved by hydrocortisone cream. I advised the patient to bring the issue up with his MD at his appointment this afternoon as there are some PO meds to help with itching. Otherwise patient did not have any questions or concerns about his medications. ? ?Darcus Austin, PharmD ?Clinical Pharmacist ?[facility] ?05/08/2021 10:20 AM ? ?

## 2021-05-08 NOTE — Patient Instructions (Signed)
Increase Metformin to 750 mg twice a day. ? ?

## 2021-05-08 NOTE — Progress Notes (Signed)
Greg Quinn ([id number]) ?Visit Report for 05/06/2021 ?Abuse Risk Screen Details ?Patient Name: Date of Service: ?Greg Quinn ERKA HN J. 05/06/2021 8:00 A M ?Medical Record Number: [id number] ?Patient Account Number: [id number] ?Date of Birth/Sex: Treating RN: ?04-Sep-1970 (51 y.o. Greg Quinn, Greg Quinn ?Primary Care Bane Hagy: PCP, NO Other Clinician: ?Referring Jeanelle Dake: ?Treating Emilyann Banka/Extender: Worthy Keeler ?Weeks in Treatment: 0 ?Abuse Risk Screen Items ?Answer ?ABUSE RISK SCREEN: ?Has anyone close to you tried to hurt or harm you recentlyo No ?Do you feel uncomfortable with anyone in your familyo No ?Has anyone forced you do things that you didnt want to doo No ?Electronic Signature(s) ?Signed: 05/08/2021 12:54:41 PM By: Rhae Hammock RN ?Entered By: Rhae Hammock on 05/06/2021 08:18:57 ?-------------------------------------------------------------------------------- ?Activities of Daily Living Details ?Patient Name: Date of Service: ?Greg Quinn ERKA HN J. 05/06/2021 8:00 A M ?Medical Record Number: [id number] ?Patient Account Number: [id number] ?Date of Birth/Sex: Treating RN: ?03-Sep-1970 (51 y.o. Greg Quinn, Greg Quinn ?Primary Care Lizann Edelman: PCP, NO Other Clinician: ?Referring Nishita Isaacks: ?Treating Amaryah Mallen/Extender: Worthy Keeler ?Weeks in Treatment: 0 ?Activities of Daily Living Items ?Answer ?Activities of Daily Living (Please select one for each item) ?[place] ?T Medications ?ake Completely Able ?Use T elephone Completely Able ?Care for Appearance Completely Able ?Use T oilet Completely Able ?Bath / Shower Completely Able ?Dress Self Completely Able ?Feed Self Completely Able ?Walk Completely Able ?Get In / Out Bed Completely Able ?Housework Completely Able ?Prepare Meals Completely Able ?Handle Money Completely Able ?Shop for Self Completely Able ?Electronic Signature(s) ?Signed: 05/08/2021 12:54:41 PM By: Rhae Hammock RN ?Entered By: Rhae Hammock on 05/06/2021 08:19:23 ?-------------------------------------------------------------------------------- ?Education Screening Details ?Patient Name: ?Date of Service: ?Greg Quinn ERKA HN J. 05/06/2021 8:00 A M ?Medical Record Number: [id number] ?Patient Account Number: [id number] ?Date of Birth/Sex: ?Treating RN: ?05/19/1970 (51 y.o. Greg Quinn, Greg Quinn ?Primary Care Ivann Trimarco: PCP, NO ?Other Clinician: ?Referring Shelitha Magley: ?Treating Anatalia Kronk/Extender: Worthy Keeler ?Weeks in Treatment: 0 ?Primary Learner Assessed: Patient ?Learning Preferences/Education Level/Primary Language ?Learning Preference: Explanation, Demonstration, Communication Board, Printed Material ?Highest Education Level: College or Above ?Preferred Language: English ?Cognitive Barrier ?Language Barrier: No ?Translator Needed: No ?Memory Deficit: No ?Emotional Barrier: No ?Cultural/Religious Beliefs Affecting Medical Care: No ?Physical Barrier ?Impaired Vision: Yes Glasses ?Impaired Hearing: No ?Decreased Hand dexterity: No ?Knowledge/Comprehension ?Knowledge Level: High ?Comprehension Level: High ?Ability to understand written instructions: High ?Ability to understand verbal instructions: High ?Motivation ?Anxiety Level: Calm ?Cooperation: Cooperative ?Education Importance: Denies Need ?Interest in Health Problems: Asks Questions ?Perception: Coherent ?Willingness to Engage in Self-Management High ?Activities: ?Readiness to Engage in Self-Management High ?Activities: ?Electronic Signature(s) ?Signed: 05/08/2021 12:54:41 PM By: Rhae Hammock RN ?Entered By: Rhae Hammock on 05/06/2021 08:19:56 ?-------------------------------------------------------------------------------- ?Fall Risk Assessment Details ?Patient Name: ?Date of Service: ?Greg Quinn ERKA HN J. 05/06/2021 8:00 A M ?Medical Record Number: [id number] ?Patient Account Number: [id number] ?Date of Birth/Sex: ?Treating RN: ?1970/03/03 (51 y.o. Greg Quinn,  Greg Quinn ?Primary Care Lyfe Reihl: PCP, NO ?Other Clinician: ?Referring Misty Foutz: ?Treating Axelle Szwed/Extender: Worthy Keeler ?Weeks in Treatment: 0 ?Fall Risk Assessment Items ?Have you had 2 or more falls in the last 12 monthso 0 No ?Have you had any fall that resulted in injury in the last 12 monthso 0 No ?FALLS RISK SCREEN ?History of falling - immediate or within 3 months 0 No ?Secondary diagnosis (Do you have 2 or more medical diagnoseso) 0 No ?Ambulatory aid ?None/bed rest/wheelchair/nurse 0 No ?Crutches/cane/walker 0 No ?Furniture 0 No ?  Intravenous therapy Access/Saline/Heparin Lock 0 No ?Gait/Transferring ?Normal/ bed rest/ wheelchair 0 No ?Weak (short steps with or without shuffle, stooped but able to lift head while walking, may seek 0 No ?support from furniture) ?Impaired (short steps with shuffle, may have difficulty arising from chair, head down, impaired 0 No ?balance) ?Mental Status ?Oriented to own ability 0 No ?Electronic Signature(s) ?Signed: 05/08/2021 12:54:41 PM By: Rhae Hammock RN ?Entered By: Rhae Hammock on 05/06/2021 08:20:04 ?-------------------------------------------------------------------------------- ?Foot Assessment Details ?Patient Name: ?Date of Service: ?Greg Quinn ERKA HN J. 05/06/2021 8:00 A M ?Medical Record Number: [id number] ?Patient Account Number: [id number] ?Date of Birth/Sex: ?Treating RN: ?August 07, 1970 (51 y.o. Greg Quinn, Greg Quinn ?Primary Care Frederik Standley: PCP, NO ?Other Clinician: ?Referring Katy Brickell: ?Treating Mindy Gali/Extender: Worthy Keeler ?Weeks in Treatment: 0 ?Foot Assessment Items ?Site Locations ?+ = Sensation present, - = Sensation absent, C = Callus, U = Ulcer ?R = Redness, W = Warmth, M = Maceration, PU = Pre-ulcerative lesion ?F = Fissure, S = Swelling, D = Dryness ?Assessment ?Right: Left: ?Other Deformity: No No ?Prior Foot Ulcer: No No ?Prior Amputation: No No ?Charcot Joint: No No ?Ambulatory Status: Ambulatory Without Help ?Gait:  Steady ?Electronic Signature(s) ?Signed: 05/08/2021 12:54:41 PM By: Rhae Hammock RN ?Entered By: Rhae Hammock on 05/06/2021 08:21:04 ?-------------------------------------------------------------------------------- ?Nutrition Risk Screening Details ?Patient Name: ?Date of Service: ?Greg Quinn ERKA HN J. 05/06/2021 8:00 A M ?Medical Record Number: [id number] ?Patient Account Number: [id number] ?Date of Birth/Sex: ?Treating RN: ?[birth year] (50 y.o. Greg Quinn, Greg Quinn ?Primary Care Abdullahi Vallone: PCP, NO ?Other Clinician: ?Referring Azucena Dart: ?Treating Tatem Fesler/Extender: Worthy Keeler ?Weeks in Treatment: 0 ?Height (in): 70 ?Weight (lbs): 267 ?Body Mass Index (BMI): 38.3 ?Nutrition Risk Screening Items ?Score Screening ?NUTRITION RISK SCREEN: ?I have an illness or condition that made me change the kind and/or amount of food I eat 0 No ?I eat fewer than two meals per day 0 No ?I eat few fruits and vegetables, or milk products 0 No ?I have three or more drinks of beer, liquor or wine almost every day 0 No ?I have tooth or mouth problems that make it hard for me to eat 0 No ?I don't always have enough money to buy the food I need 0 No ?I eat alone most of the time 0 No ?I take three or more different prescribed or over-the-counter drugs a day 0 No ?Without wanting to, I have lost or gained 10 pounds in the last six months 0 No ?I am not always physically able to shop, cook and/or feed myself 0 No ?Nutrition Protocols ?Good Risk Protocol 0 No interventions needed ?Moderate Risk Protocol ?High Risk Proctocol ?Risk Level: Good Risk ?Score: 0 ?Electronic Signature(s) ?Signed: 05/08/2021 12:54:41 PM By: Rhae Hammock RN ?Entered By: Rhae Hammock on 05/06/2021 08:20:11 ?

## 2021-05-08 NOTE — Progress Notes (Signed)
ZIEN, TOSCH ([id number]) ?Visit Report for 05/06/2021 ?Allergy List Details ?Patient Name: Date of Service: ?Greg Quinn ERKA HN J. 05/06/2021 8:00 A M ?Medical Record Number: [id number] ?Patient Account Number: [id number] ?Date of Birth/Sex: Treating RN: ?[birth year] (51 y.o. Greg Quinn, Greg Quinn ?Primary Care [name]: PCP, NO Other Clinician: ?Referring [name]: ?Treating [name]: Worthy Keeler ?Weeks in Treatment: 0 ?Allergies ?Active Allergies ?heparin ?pork derived products ?Type: Food ?Allergy Notes ?Electronic Signature(s) ?Signed: 05/08/2021 12:54:41 PM By: Rhae Hammock RN ?Entered By: Rhae Hammock on 05/06/2021 08:15:04 ?-------------------------------------------------------------------------------- ?Arrival Information Details ?Patient Name: Date of Service: ?Greg Quinn ERKA HN J. 05/06/2021 8:00 A M ?Medical Record Number: [id number] ?Patient Account Number: [id number] ?Date of Birth/Sex: Treating RN: ?10-21-1970 (51 y.o. Greg Quinn, Greg Quinn ?Primary Care Odelle Kosier: PCP, NO Other Clinician: ?Referring Zoraida Havrilla: ?Treating Ikaika Showers/Extender: Worthy Keeler ?Weeks in Treatment: 0 ?Visit Information ?Patient Arrived: Ambulatory ?Arrival Time: 08:14 ?Accompanied By: self ?Transfer Assistance: None ?Patient Identification Verified: Yes ?Secondary Verification Process Completed: Yes ?Patient Has Alerts: Yes ?Patient Alerts: Patient on Blood Thinner ?Electronic Signature(s) ?Signed: 05/08/2021 12:54:41 PM By: Rhae Hammock RN ?Entered By: Rhae Hammock on 05/06/2021 08:14:36 ?-------------------------------------------------------------------------------- ?Clinic Level of Care Assessment Details ?Patient Name: Date of Service: ?Greg Quinn ERKA HN J. 05/06/2021 8:00 A M ?Medical Record Number: [id number] ?Patient Account Number: [id number] ?Date of Birth/Sex: Treating RN: ?03/31/1970 (51 y.o. Greg Quinn, Greg Quinn ?Primary Care Almee Pelphrey: PCP, NO Other  Clinician: ?Referring Qadir Folks: ?Treating Tenille Morrill/Extender: Worthy Keeler ?Weeks in Treatment: 0 ?Clinic Level of Care Assessment Items ?TOOL 4 Quantity Score ?X- 1 0 ?Use when only an EandM is performed on FOLLOW-UP visit ?ASSESSMENTS - Nursing Assessment / Reassessment ?X- 1 10 ?Reassessment of Co-morbidities (includes updates in patient status) ?X- 1 5 ?Reassessment of Adherence to Treatment Plan ?ASSESSMENTS - Wound and Skin A ssessment / Reassessment ?X - Simple Wound Assessment / Reassessment - one wound 1 5 ?[]  - 0 ?Complex Wound Assessment / Reassessment - multiple wounds ?[]  - 0 ?Dermatologic / Skin Assessment (not related to wound area) ?ASSESSMENTS - Focused Assessment ?[]  - 0 ?Circumferential Edema Measurements - multi extremities ?[]  - 0 ?Nutritional Assessment / Counseling / Intervention ?[]  - 0 ?Lower Extremity Assessment (monofilament, tuning fork, pulses) ?[]  - 0 ?Peripheral Arterial Disease Assessment (using hand held doppler) ?ASSESSMENTS - Ostomy and/or Continence Assessment and Care ?[]  - 0 ?Incontinence Assessment and Management ?[]  - 0 ?Ostomy Care Assessment and Management (repouching, etc.) ?PROCESS - Coordination of Care ?X - Simple Patient / Family Education for ongoing care 1 15 ?[]  - 0 ?Complex (extensive) Patient / Family Education for ongoing care ?X- 1 10 ?Staff obtains Consents, Records, T Results / Process Orders ?est ?X- 1 10 ?Staff telephones HHA, Nursing Homes / Clarify orders / etc ?[]  - 0 ?Routine Transfer to another Facility (non-emergent condition) ?[]  - 0 ?Routine Hospital Admission (non-emergent condition) ?X- 1 15 ?New Admissions / Biomedical engineer / Ordering NPWT Apligraf, etc. ?, ?[]  - 0 ?Emergency Hospital Admission (emergent condition) ?X- 1 10 ?Simple Discharge Coordination ?[]  - 0 ?Complex (extensive) Discharge Coordination ?PROCESS - Special Needs ?[]  - 0 ?Pediatric / Minor Patient Management ?[]  - 0 ?Isolation Patient Management ?[]  - 0 ?Hearing /  Language / Visual special needs ?[]  - 0 ?Assessment of Community assistance (transportation, D/C planning, etc.) ?[]  - 0 ?Additional assistance / Altered mentation ?[]  - 0 ?Support Surface(s) Assessment (bed, cushion, seat, etc.) ?INTERVENTIONS - Wound Cleansing / Measurement ?X - Simple Wound  Cleansing - one wound 1 5 ?[]  - 0 ?Complex Wound Cleansing - multiple wounds ?X- 1 5 ?Wound Imaging (photographs - any number of wounds) ?[]  - 0 ?Wound Tracing (instead of photographs) ?X- 1 5 ?Simple Wound Measurement - one wound ?[]  - 0 ?Complex Wound Measurement - multiple wounds ?INTERVENTIONS - Wound Dressings ?X - Small Wound Dressing one or multiple wounds 1 10 ?[]  - 0 ?Medium Wound Dressing one or multiple wounds ?[]  - 0 ?Large Wound Dressing one or multiple wounds ?X- 1 5 ?Application of Medications - topical ?[]  - 0 ?Application of Medications - injection ?INTERVENTIONS - Miscellaneous ?[]  - 0 ?External ear exam ?[]  - 0 ?Specimen Collection (cultures, biopsies, blood, body fluids, etc.) ?[]  - 0 ?Specimen(s) / Culture(s) sent or taken to Lab for analysis ?[]  - 0 ?Patient Transfer (multiple staff / Civil Service fast streamer / Similar devices) ?[]  - 0 ?Simple Staple / Suture removal (25 or less) ?[]  - 0 ?Complex Staple / Suture removal (26 or more) ?[]  - 0 ?Hypo / Hyperglycemic Management (close monitor of Blood Glucose) ?[]  - 0 ?Ankle / Brachial Index (ABI) - do not check if billed separately ?X- 1 5 ?Vital Signs ?Has the patient been seen at the hospital within the last three years: Yes ?Total Score: 115 ?Level Of Care: New/Established - Level 3 ?Electronic Signature(s) ?Signed: 05/08/2021 12:54:41 PM By: Rhae Hammock RN ?Entered By: Rhae Hammock on 05/06/2021 09:16:01 ?-------------------------------------------------------------------------------- ?Encounter Discharge Information Details ?Patient Name: Date of Service: ?Greg Quinn ERKA HN J. 05/06/2021 8:00 A M ?Medical Record Number: [id number] ?Patient Account  Number: [id number] ?Date of Birth/Sex: Treating RN: ?1970-07-01 (51 y.o. Greg Quinn, Greg Quinn ?Primary Care Alajia Schmelzer: PCP, NO Other Clinician: ?Referring Jacobo Moncrief: ?Treating Victorhugo Preis/Extender: Worthy Keeler ?Weeks in Treatment: 0 ?Encounter Discharge Information Items ?Discharge Condition: Stable ?Ambulatory Status: Ambulatory ?Discharge Destination: Home ?Transportation: Private Auto ?Accompanied By: self ?Schedule Follow-up Appointment: Yes ?Clinical Summary of Care: Patient Declined ?Electronic Signature(s) ?Signed: 05/08/2021 12:54:41 PM By: Rhae Hammock RN ?Entered By: Rhae Hammock on 05/06/2021 10:30:57 ?-------------------------------------------------------------------------------- ?Lower Extremity Assessment Details ?Patient Name: ?Date of Service: ?Greg Quinn ERKA HN J. 05/06/2021 8:00 A M ?Medical Record Number: [id number] ?Patient Account Number: [id number] ?Date of Birth/Sex: ?Treating RN: ?[birth year] (51 y.o. Greg Quinn, Greg Quinn ?Primary Care Jayme Mednick: PCP, NO ?Other Clinician: ?Referring Kevonte Vanecek: ?Treating Holy Battenfield/Extender: Worthy Keeler ?Weeks in Treatment: 0 ?Electronic Signature(s) ?Signed: 05/08/2021 12:54:41 PM By: Rhae Hammock RN ?Entered By: Rhae Hammock on 05/06/2021 08:21:12 ?-------------------------------------------------------------------------------- ?Multi-Disciplinary Care Plan Details ?Patient Name: ?Date of Service: ?Greg Quinn ERKA HN J. 05/06/2021 8:00 A M ?Medical Record Number: [id number] ?Patient Account Number: [id number] ?Date of Birth/Sex: ?Treating RN: ?[birth year] (51 y.o. Greg Quinn, Greg Quinn ?Primary Care Roan Sawchuk: PCP, NO ?Other Clinician: ?Referring Egbert Seidel: ?Treating Zyasia Halbleib/Extender: Worthy Keeler ?Weeks in Treatment: 0 ?Active Inactive ?Orientation to the Wound Care Program ?Nursing Diagnoses: ?Knowledge deficit related to the wound healing center program ?Goals: ?Patient/caregiver will verbalize understanding of the [facility] ?Date Initiated: 05/06/2021 ?Target Resolution Date: 05/28/2021 ?Goal Status: Active ?Interventions: ?Provide education on orientation to the wound center ?Notes: ?Wound/Skin Impairment ?Nursing Diagnoses: ?Impaired t

## 2021-05-09 ENCOUNTER — Encounter: Payer: Self-pay | Admitting: Internal Medicine

## 2021-05-09 DIAGNOSIS — R195 Other fecal abnormalities: Secondary | ICD-10-CM | POA: Insufficient documentation

## 2021-05-09 DIAGNOSIS — Z87891 Personal history of nicotine dependence: Secondary | ICD-10-CM | POA: Insufficient documentation

## 2021-05-10 LAB — MICROALBUMIN / CREATININE URINE RATIO
Creatinine, Urine: 105.8 mg/dL
Microalb/Creat Ratio: 110 mg/g creat — ABNORMAL HIGH (ref 0–29)
Microalbumin, Urine: 116.7 ug/mL

## 2021-05-13 ENCOUNTER — Encounter (HOSPITAL_BASED_OUTPATIENT_CLINIC_OR_DEPARTMENT_OTHER): Payer: BC Managed Care – PPO | Admitting: Physician Assistant

## 2021-05-13 DIAGNOSIS — E11622 Type 2 diabetes mellitus with other skin ulcer: Secondary | ICD-10-CM | POA: Diagnosis not present

## 2021-05-13 DIAGNOSIS — I10 Essential (primary) hypertension: Secondary | ICD-10-CM | POA: Diagnosis not present

## 2021-05-13 DIAGNOSIS — T8189XA Other complications of procedures, not elsewhere classified, initial encounter: Secondary | ICD-10-CM | POA: Diagnosis not present

## 2021-05-13 DIAGNOSIS — L98492 Non-pressure chronic ulcer of skin of other sites with fat layer exposed: Secondary | ICD-10-CM | POA: Diagnosis not present

## 2021-05-13 DIAGNOSIS — E1152 Type 2 diabetes mellitus with diabetic peripheral angiopathy with gangrene: Secondary | ICD-10-CM | POA: Diagnosis not present

## 2021-05-13 DIAGNOSIS — N493 Fournier gangrene: Secondary | ICD-10-CM | POA: Diagnosis not present

## 2021-05-13 NOTE — Progress Notes (Addendum)
GARREL, STEEGE ([id number]) ?Visit Report for 05/13/2021 ?Chief Complaint Document Details ?Patient Name: Date of Service: ?Rosina Lowenstein ERKA HN J. 05/13/2021 9:30 A M ?Medical Record Number: [id number] ?Patient Account Number: [id number] ?Date of Birth/Sex: Treating RN: ?[birth year] (51 y.o. [name], [name] ?Primary Care Provider: Karle Plumber Other Clinician: ?Referring Provider: ?Treating Provider/Extender: Worthy Keeler ?Weeks in Treatment: 1 ?Information Obtained from: Patient ?Chief Complaint ?Scrotal Fournier gangrene ?Electronic Signature(s) ?Signed: 05/13/2021 10:17:59 AM By: Worthy Keeler PA-C ?Entered By: Worthy Keeler on 05/13/2021 10:17:59 ?-------------------------------------------------------------------------------- ?HPI Details ?Patient Name: Date of Service: ?Rosina Lowenstein ERKA HN J. 05/13/2021 9:30 A M ?Medical Record Number: [id number] ?Patient Account Number: [id number] ?Date of Birth/Sex: Treating RN: ?03/01/1970 (51 y.o. [name], [name] ?Primary Care Provider: Karle Plumber Other Clinician: ?Referring Provider: ?Treating Provider/Extender: Worthy Keeler ?Weeks in Treatment: 1 ?History of Present Illness ?HPI Description: 05-06-2021 upon evaluation today patient appears to be doing somewhat poorly in regard to an area over the scrotal area where he did have ?unfortunately a a small area of abscess which subsequently developed into 40 gangrene. Subsequently he ended up having emergency incision and drainage ?at the emergency room and then subsequently was placed in the hospital for monitoring and antibiotics. Before discharge they discontinued packing and just ?had him put Steri-Strips over the area. With that being said I do not think that is going to be appropriate from a long-term perspective. It was also documented ?in the note that the patient did have a hemoglobin A1c of 10. He also was put on IV insulin in the hospital. This all began on 25 March when he  thought he just ?had an ingrown hair. There was a culture performed and did show group B strep and staph but not MRSA. He has been on Augmentin he has another 8 days of ?that as well to continue at this point. He is not having as much pain as it was in the beginning but is still kind of leery which is completely understandable. ?05-13-2021 upon evaluation today patient actually appears to be showing signs of improvement which is great news. There was a mixup with getting him his ?supplies apparently the order did not push through until after already signed everything Friday afternoon and subsequently it did not get signed. Nonetheless I ?do believe that at this time the patient is definitely doing much better though and I do believe that with the supplies and what he needs to continue to treat this ?he should do quite well. ?Electronic Signature(s) ?Signed: 05/13/2021 11:30:01 AM By: Worthy Keeler PA-C ?Entered By: Worthy Keeler on 05/13/2021 11:30:01 ?-------------------------------------------------------------------------------- ?Physical Exam Details ?Patient Name: Date of Service: ?Rosina Lowenstein ERKA HN J. 05/13/2021 9:30 A M ?Medical Record Number: [id number] ?Patient Account Number: [id number] ?Date of Birth/Sex: Treating RN: ?1970-04-22 (51 y.o. [name], [name] ?Primary Care Provider: Karle Plumber Other Clinician: ?Referring Provider: ?Treating Provider/Extender: Worthy Keeler ?Weeks in Treatment: 1 ?Constitutional ?Well-nourished and well-hydrated in no acute distress. ?Respiratory ?normal breathing without difficulty. ?Psychiatric ?this patient is able to make decisions and demonstrates good insight into disease process. Alert and Oriented x 3. pleasant and cooperative. ?Notes ?Upon inspection patient's wound bed actually showed signs of good granulation and epithelization at this point. Fortunately I do feel like that the patient is ?showing signs of improvement although again it is still  going to take some time before he is completely healed obviously. ?Electronic Signature(s) ?Signed:  05/13/2021 11:30:22 AM By: Worthy Keeler PA-C ?Entered By: Worthy Keeler on 05/13/2021 11:30:22 ?-------------------------------------------------------------------------------- ?Physician Orders Details ?Patient Name: Date of Service: ?Rosina Lowenstein ERKA HN J. 05/13/2021 9:30 A M ?Medical Record Number: [id number] ?Patient Account Number: [id number] ?Date of Birth/Sex: Treating RN: ?1970/03/22 (51 y.o. [name], [name] ?Primary Care Provider: Karle Plumber Other Clinician: ?Referring Provider: ?Treating Provider/Extender: Worthy Keeler ?Weeks in Treatment: 1 ?Verbal / Phone Orders: No ?Diagnosis Coding ?ICD-10 Coding ?Code Description ?N49.3 Fournier gangrene ?L98.492 Non-pressure chronic ulcer of skin of other sites with fat layer exposed ?E11.622 Type 2 diabetes mellitus with other skin ulcer ?I10 Essential (primary) hypertension ?Follow-up Appointments ?ppointment in 1 week. - w/ Toney Rakes, RN Room # 9 ?Return A ?Bathing/ Shower/ Hygiene ?May shower with protection but do not get wound dressing(s) wet. ?Wound Treatment ?Wound #1 - Scrotum ?Cleanser: Soap and Water 1 x Per Day/7 Days ?Discharge Instructions: May shower and wash wound with dial antibacterial soap and water prior to dressing change. ?Cleanser: Wound Cleanser (DME) (Generic) 1 x Per Day/7 Days ?Discharge Instructions: Cleanse the wound with wound cleanser prior to applying a clean dressing using gauze sponges, not tissue or cotton balls. ?Cleanser: Vashe 5.8 (oz) (DME) (Generic) 1 x Per Day/7 Days ?Discharge Instructions: Moisten 4 x 4 gauze with the vashe and pack into wound!! ?Prim Dressing: vashe soaked gauze (Generic) 1 x Per Day/7 Days ?ary ?Secondary Dressing: Woven Gauze Sponge, Non-Sterile 4x4 in (DME) (Generic) 1 x Per Day/7 Days ?Discharge Instructions: Apply over primary dressing as directed. ?Secondary Dressing: allevyn  5x5 silicone foam border (DME) (Generic) 1 x Per Day/7 Days ?Electronic Signature(s) ?Signed: 05/13/2021 4:47:54 PM By: Worthy Keeler PA-C ?Signed: 05/14/2021 5:27:50 PM By: Rhae Hammock RN ?Entered By: Rhae Hammock on 05/13/2021 10:36:55 ?-------------------------------------------------------------------------------- ?Problem List Details ?Patient Name: ?Date of Service: ?Rosina Lowenstein ERKA HN J. 05/13/2021 9:30 A M ?Medical Record Number: [id number] ?Patient Account Number: [id number] ?Date of Birth/Sex: ?Treating RN: ?[birth year] (51 y.o. [name], [name] ?Primary Care Provider: Karle Plumber ?Other Clinician: ?Referring Provider: ?Treating Provider/Extender: Worthy Keeler ?Weeks in Treatment: 1 ?Active Problems ?ICD-10 ?Encounter ?Code Description Active Date MDM ?Diagnosis ?N49.3 Fournier gangrene 05/06/2021 No Yes ?[id number] Non-pressure chronic ulcer of skin of other sites with fat layer exposed 05/06/2021 No Yes ?E11.622 Type 2 diabetes mellitus with other skin ulcer 05/06/2021 No Yes ?I10 Essential (primary) hypertension 05/06/2021 No Yes ?Inactive Problems ?Resolved Problems ?Electronic Signature(s) ?Signed: 05/13/2021 10:17:05 AM By: Worthy Keeler PA-C ?Entered By: Worthy Keeler on 05/13/2021 10:17:04 ?-------------------------------------------------------------------------------- ?Progress Note Details ?Patient Name: ?Date of Service: ?Rosina Lowenstein ERKA HN J. 05/13/2021 9:30 A M ?Medical Record Number: [id number] ?Patient Account Number: [id number] ?Date of Birth/Sex: ?Treating RN: ?01-Sep-1970 (51 y.o. [name], [name] ?Primary Care Provider: Karle Plumber ?Other Clinician: ?Referring Provider: ?Treating Provider/Extender: Worthy Keeler ?Weeks in Treatment: 1 ?Subjective ?Chief Complaint ?Information obtained from Patient ?Scrotal Fournier gangrene ?History of Present Illness (HPI) ?05-06-2021 upon evaluation today patient appears to be doing somewhat poorly in regard to an area  over the scrotal area where he did have unfortunately a a ?small area of abscess which subsequently developed into 40 gangrene. Subsequently he ended up having emergency incision and drainage at the [facility]

## 2021-05-14 DIAGNOSIS — S3130XA Unspecified open wound of scrotum and testes, initial encounter: Secondary | ICD-10-CM | POA: Diagnosis not present

## 2021-05-14 NOTE — Progress Notes (Signed)
BLAISE, FALANGA ([id number]) ?Visit Report for 05/13/2021 ?Arrival Information Details ?Patient Name: Date of Service: ?Greg Lowenstein ERKA HN J. 05/13/2021 9:30 A M ?Medical Record Number: [id number] ?Patient Account Number: [id number] ?Date of Birth/Sex: Treating RN: ?July 28, 1970 (51 y.o. Greg Quinn, [name] ?Primary Care Jakiya Bookbinder: Karle Plumber Other Clinician: ?Referring Alakai Macbride: ?Treating Ronnel Zuercher/Extender: Worthy Keeler ?Weeks in Treatment: 1 ?Visit Information History Since Last Visit ?Added or deleted any medications: No ?Patient Arrived: Ambulatory ?Any new allergies or adverse reactions: No ?Arrival Time: 09:56 ?Had a fall or experienced change in No ?Accompanied By: self ?activities of daily living that may affect ?Transfer Assistance: None ?risk of falls: ?Patient Identification Verified: Yes ?Signs or symptoms of abuse/neglect since last visito No ?Secondary Verification Process Completed: Yes ?Hospitalized since last visit: No ?Patient Has Alerts: Yes ?Implantable device outside of the clinic excluding No ?Patient Alerts: Patient on Blood Thinner ?cellular tissue based products placed in the center ?since last visit: ?Has Dressing in Place as Prescribed: Yes ?Pain Present Now: Yes ?Electronic Signature(s) ?Signed: 05/14/2021 5:27:50 PM By: Rhae Hammock RN ?Entered By: Rhae Hammock on 05/13/2021 09:56:39 ?-------------------------------------------------------------------------------- ?Clinic Level of Care Assessment Details ?Patient Name: Date of Service: ?Greg Lowenstein ERKA HN J. 05/13/2021 9:30 A M ?Medical Record Number: [id number] ?Patient Account Number: [id number] ?Date of Birth/Sex: Treating RN: ?[birth year] (51 y.o. Greg Quinn, [name] ?Primary Care Liberato Stansbery: Karle Plumber Other Clinician: ?Referring Natahsa Marian: ?Treating Delorus Langwell/Extender: Worthy Keeler ?Weeks in Treatment: 1 ?Clinic Level of Care Assessment Items ?TOOL 4 Quantity Score ?X- 1 0 ?Use when only an EandM is  performed on FOLLOW-UP visit ?ASSESSMENTS - Nursing Assessment / Reassessment ?X- 1 10 ?Reassessment of Co-morbidities (includes updates in patient status) ?X- 1 5 ?Reassessment of Adherence to Treatment Plan ?ASSESSMENTS - Wound and Skin A ssessment / Reassessment ?X - Simple Wound Assessment / Reassessment - one wound 1 5 ?[]  - 0 ?Complex Wound Assessment / Reassessment - multiple wounds ?[]  - 0 ?Dermatologic / Skin Assessment (not related to wound area) ?ASSESSMENTS - Focused Assessment ?[]  - 0 ?Circumferential Edema Measurements - multi extremities ?[]  - 0 ?Nutritional Assessment / Counseling / Intervention ?[]  - 0 ?Lower Extremity Assessment (monofilament, tuning fork, pulses) ?[]  - 0 ?Peripheral Arterial Disease Assessment (using hand held doppler) ?ASSESSMENTS - Ostomy and/or Continence Assessment and Care ?[]  - 0 ?Incontinence Assessment and Management ?[]  - 0 ?Ostomy Care Assessment and Management (repouching, etc.) ?PROCESS - Coordination of Care ?X - Simple Patient / Family Education for ongoing care 1 15 ?[]  - 0 ?Complex (extensive) Patient / Family Education for ongoing care ?X- 1 10 ?Staff obtains Consents, Records, T Results / Process Orders ?est ?[]  - 0 ?Staff telephones HHA, Nursing Homes / Clarify orders / etc ?[]  - 0 ?Routine Transfer to another Facility (non-emergent condition) ?[]  - 0 ?Routine Hospital Admission (non-emergent condition) ?[]  - 0 ?New Admissions / Biomedical engineer / Ordering NPWT Apligraf, etc. ?, ?[]  - 0 ?Emergency Hospital Admission (emergent condition) ?X- 1 10 ?Simple Discharge Coordination ?[]  - 0 ?Complex (extensive) Discharge Coordination ?PROCESS - Special Needs ?[]  - 0 ?Pediatric / Minor Patient Management ?[]  - 0 ?Isolation Patient Management ?[]  - 0 ?Hearing / Language / Visual special needs ?[]  - 0 ?Assessment of Community assistance (transportation, D/C planning, etc.) ?[]  - 0 ?Additional assistance / Altered mentation ?[]  - 0 ?Support Surface(s) Assessment  (bed, cushion, seat, etc.) ?INTERVENTIONS - Wound Cleansing / Measurement ?X - Simple Wound Cleansing - one wound 1 5 ?[]  -  0 ?Complex Wound Cleansing - multiple wounds ?X- 1 5 ?Wound Imaging (photographs - any number of wounds) ?[]  - 0 ?Wound Tracing (instead of photographs) ?X- 1 5 ?Simple Wound Measurement - one wound ?[]  - 0 ?Complex Wound Measurement - multiple wounds ?INTERVENTIONS - Wound Dressings ?X - Small Wound Dressing one or multiple wounds 1 10 ?[]  - 0 ?Medium Wound Dressing one or multiple wounds ?[]  - 0 ?Large Wound Dressing one or multiple wounds ?X- 1 5 ?Application of Medications - topical ?[]  - 0 ?Application of Medications - injection ?INTERVENTIONS - Miscellaneous ?[]  - 0 ?External ear exam ?[]  - 0 ?Specimen Collection (cultures, biopsies, blood, body fluids, etc.) ?[]  - 0 ?Specimen(s) / Culture(s) sent or taken to Lab for analysis ?[]  - 0 ?Patient Transfer (multiple staff / [employer] Service fast streamer / Similar devices) ?[]  - 0 ?Simple Staple / Suture removal (25 or less) ?[]  - 0 ?Complex Staple / Suture removal (26 or more) ?[]  - 0 ?Hypo / Hyperglycemic Management (close monitor of Blood Glucose) ?[]  - 0 ?Ankle / Brachial Index (ABI) - do not check if billed separately ?X- 1 5 ?Vital Signs ?Has the patient been seen at the hospital within the last three years: Yes ?Total Score: 90 ?Level Of Care: New/Established - Level 3 ?Electronic Signature(s) ?Signed: 05/14/2021 5:27:50 PM By: Rhae Hammock RN ?Entered By: Rhae Hammock on 05/13/2021 10:51:27 ?-------------------------------------------------------------------------------- ?Encounter Discharge Information Details ?Patient Name: Date of Service: ?Greg Lowenstein ERKA HN J. 05/13/2021 9:30 A M ?Medical Record Number: [id number] ?Patient Account Number: [id number] ?Date of Birth/Sex: Treating RN: ?April 01, 1970 (51 y.o. Greg Quinn, [name] ?Primary Care Brendan Gadson: Karle Plumber Other Clinician: ?Referring Danyelle Brookover: ?Treating Allysen Lazo/Extender: Worthy Keeler ?Weeks in Treatment: 1 ?Encounter Discharge Information Items ?Discharge Condition: Stable ?Ambulatory Status: Ambulatory ?Discharge Destination: Home ?Transportation: Private Auto ?Accompanied By: self ?Schedule Follow-up Appointment: Yes ?Clinical Summary of Care: Patient Declined ?Electronic Signature(s) ?Signed: 05/14/2021 5:27:50 PM By: Rhae Hammock RN ?Entered By: Rhae Hammock on 05/13/2021 10:55:01 ?-------------------------------------------------------------------------------- ?Lower Extremity Assessment Details ?Patient Name: Date of Service: ?Greg Lowenstein ERKA HN J. 05/13/2021 9:30 A M ?Medical Record Number: [id number] ?Patient Account Number: [id number] ?Date of Birth/Sex: Treating RN: ?December 11, 1970 (51 y.o. Greg Quinn, [name] ?Primary Care Chermaine Schnyder: Karle Plumber Other Clinician: ?Referring Duan Scharnhorst: ?Treating Sloka Volante/Extender: Worthy Keeler ?Weeks in Treatment: 1 ?Electronic Signature(s) ?Signed: 05/14/2021 5:27:50 PM By: Rhae Hammock RN ?Entered By: Rhae Hammock on 05/13/2021 10:00:13 ?-------------------------------------------------------------------------------- ?Multi Wound Chart Details ?Patient Name: Date of Service: ?Greg Lowenstein ERKA HN J. 05/13/2021 9:30 A M ?Medical Record Number: [id number] ?Patient Account Number: [id number] ?Date of Birth/Sex: ?Treating RN: ?[birth year] (51 y.o. Greg Quinn, [name] ?Primary Care Jolynn Bajorek: Karle Plumber ?Other Clinician: ?Referring Elany Felix: ?Treating Lyanne Kates/Extender: Worthy Keeler ?Weeks in Treatment: 1 ?Vital Signs ?Height(in): 70 ?Pulse(bpm): 74 ?Weight(lbs): 267 ?Blood Pressure(mmHg): 134/74 ?Body Mass Index(BMI): 38.3 ?Temperature(??F): 97.9 ?Respiratory Rate(breaths/min): 17 ?Photos: [N/A:N/A] ?Scrotum N/A N/A ?Wound Location: ?Surgical Injury N/A N/A ?Wounding Event: ?Open Surgical Wound N/A N/A ?Primary Etiology: ?Coronary Artery Disease, Deep Vein N/A N/A ?Comorbid History: ?Thrombosis,  Hypertension ?04/18/2021 N/A N/A ?Date Acquired: ?1 N/A N/A ?Weeks of Treatment: ?Open N/A N/A ?Wound Status: ?No N/A N/A ?Wound Recurrence: ?6x1x1 N/A N/A ?Measurements L x W x D (cm) ?4.712 N/A N/A ?A (cm?) : ?rea ?4.712

## 2021-05-20 ENCOUNTER — Other Ambulatory Visit: Payer: Self-pay | Admitting: Internal Medicine

## 2021-05-20 ENCOUNTER — Encounter (HOSPITAL_BASED_OUTPATIENT_CLINIC_OR_DEPARTMENT_OTHER): Payer: BC Managed Care – PPO | Admitting: Physician Assistant

## 2021-05-20 DIAGNOSIS — E1152 Type 2 diabetes mellitus with diabetic peripheral angiopathy with gangrene: Secondary | ICD-10-CM | POA: Diagnosis not present

## 2021-05-20 DIAGNOSIS — T8189XA Other complications of procedures, not elsewhere classified, initial encounter: Secondary | ICD-10-CM | POA: Diagnosis not present

## 2021-05-20 DIAGNOSIS — E11622 Type 2 diabetes mellitus with other skin ulcer: Secondary | ICD-10-CM | POA: Diagnosis not present

## 2021-05-20 DIAGNOSIS — N493 Fournier gangrene: Secondary | ICD-10-CM | POA: Diagnosis not present

## 2021-05-20 DIAGNOSIS — L98492 Non-pressure chronic ulcer of skin of other sites with fat layer exposed: Secondary | ICD-10-CM | POA: Diagnosis not present

## 2021-05-20 DIAGNOSIS — I10 Essential (primary) hypertension: Secondary | ICD-10-CM | POA: Diagnosis not present

## 2021-05-20 DIAGNOSIS — I2510 Atherosclerotic heart disease of native coronary artery without angina pectoris: Secondary | ICD-10-CM

## 2021-05-20 NOTE — Progress Notes (Signed)
Greg Quinn ([id number]) ?Visit Report for 05/20/2021 ?Chief Complaint Document Details ?Patient Name: Date of Service: ?Greg Brownie ERKA HN J. 05/20/2021 9:30 A M ?Medical Record Number: [id number] ?Patient Account Number: [id number] ?Date of Birth/Sex: Treating RN: ?January 23, 1971 (51 y.o. Greg Quinn ?Primary Care Provider: Jonah Quinn Other Clinician: ?Referring Provider: ?Treating Provider/Extender: Greg Quinn ?Greg Quinn ?Weeks in Treatment: 2 ?Information Obtained from: Patient ?Chief Complaint ?Scrotal Fournier gangrene ?Electronic Signature(s) ?Signed: 05/20/2021 9:39:16 AM By: Greg Kelp PA-C ?Entered By: Greg Quinn on 05/20/2021 09:39:16 ?-------------------------------------------------------------------------------- ?HPI Details ?Patient Name: Date of Service: ?Greg Brownie ERKA HN J. 05/20/2021 9:30 A M ?Medical Record Number: [id number] ?Patient Account Number: [id number] ?Date of Birth/Sex: Treating RN: ?1971-01-14 (51 y.o. Greg Quinn ?Primary Care Provider: Jonah Quinn Other Clinician: ?Referring Provider: ?Treating Provider/Extender: Greg Quinn ?Greg Quinn ?Weeks in Treatment: 2 ?History of Present Illness ?HPI Description: 05-06-2021 upon evaluation today patient appears to be doing somewhat poorly in regard to an area over the scrotal area where he did have ?unfortunately a a small area of abscess which subsequently developed into 40 gangrene. Subsequently he ended up having emergency incision and drainage ?at the emergency room and then subsequently was placed in the hospital for monitoring and antibiotics. Before discharge they discontinued packing and just ?had him put Steri-Strips over the area. With that being said I do not think that is going to be appropriate from a long-term perspective. It was also documented ?in the note that the patient did have a hemoglobin A1c of 10. He also was put on IV insulin in the hospital.  This all began on 25 March when he thought he just ?had an ingrown hair. There was a culture performed and did show group B strep and staph but not MRSA. He has been on Augmentin he has another 8 days of ?that as well to continue at this point. He is not having as much pain as it was in the beginning but is still kind of leery which is completely understandable. ?05-13-2021 upon evaluation today patient actually appears to be showing signs of improvement which is great news. There was a mixup with getting him his ?supplies apparently the order did not push through until after already signed everything Friday afternoon and subsequently it did not get signed. Nonetheless I ?do believe that at this time the patient is definitely doing much better though and I do believe that with the supplies and what he needs to continue to treat this ?he should do quite well. ?05-20-2021 upon evaluation today patient's wound is actually showing signs of excellent improvement which is great news and overall I do not see any evidence ?of active infection locally or systemically which is great news. In general I think we are definitely headed in the right direction here. ?Electronic Signature(s) ?Signed: 05/20/2021 10:11:19 AM By: Greg Kelp PA-C ?Entered By: Greg Quinn on 05/20/2021 10:11:19 ?-------------------------------------------------------------------------------- ?Physical Exam Details ?Patient Name: Date of Service: ?Greg Brownie ERKA HN J. 05/20/2021 9:30 A M ?Medical Record Number: [id number] ?Patient Account Number: [id number] ?Date of Birth/Sex: Treating RN: ?04/27/1970 (51 y.o. Greg Quinn ?Primary Care Provider: Jonah Quinn Other Clinician: ?Referring Provider: ?Treating Provider/Extender: Greg Quinn ?Greg Quinn ?Weeks in Treatment: 2 ?Constitutional ?Well-nourished and well-hydrated in no acute distress. ?Respiratory ?normal breathing without difficulty. ?Psychiatric ?this patient is  able to make decisions and demonstrates good insight into disease process. Alert and Oriented  x 3. pleasant and cooperative. ?Notes ?Patient's wound showed signs of excellent healing and in fact is about half the size that it was previous. Fortunately I do not see any evidence of infection ?locally or systemically which is great news and overall very pleased in that regard. ?Electronic Signature(s) ?Signed: 05/20/2021 10:11:40 AM By: Greg Kelp PA-C ?Entered By: Greg Quinn on 05/20/2021 10:11:40 ?-------------------------------------------------------------------------------- ?Physician Orders Details ?Patient Name: Date of Service: ?Greg Brownie ERKA HN J. 05/20/2021 9:30 A M ?Medical Record Number: [id number] ?Patient Account Number: [id number] ?Date of Birth/Sex: Treating RN: ?1970-05-19 (51 y.o. Greg Quinn ?Primary Care Provider: Jonah Quinn Other Clinician: ?Referring Provider: ?Treating Provider/Extender: Greg Quinn ?Greg Quinn ?Weeks in Treatment: 2 ?Verbal / Phone Orders: No ?Diagnosis Coding ?Follow-up Appointments ?ppointment in 1 week. - w/ Greg Lento, RN Room # 9 ?Return A ?Bathing/ Shower/ Hygiene ?May shower with protection but do not get wound dressing(s) wet. ?Wound Treatment ?Wound #1 - Scrotum ?Cleanser: Soap and Water 1 x Per Day/7 Days ?Discharge Instructions: May shower and wash wound with dial antibacterial soap and water prior to dressing change. ?Cleanser: Wound Cleanser (Generic) 1 x Per Day/7 Days ?Discharge Instructions: Cleanse the wound with wound cleanser prior to applying a clean dressing using gauze sponges, not tissue or cotton balls. ?Cleanser: Vashe 5.8 (oz) (Generic) 1 x Per Day/7 Days ?Discharge Instructions: Moisten 4 x 4 gauze with the vashe and pack into wound!! ?Prim Dressing: vashe soaked gauze (Generic) 1 x Per Day/7 Days ?ary ?Secondary Dressing: Woven Gauze Sponge, Non-Sterile 4x4 in (Generic) 1 x Per Day/7 Days ?Discharge  Instructions: Apply over primary dressing as directed. ?Secondary Dressing: allevyn 5x5 silicone foam border (Generic) 1 x Per Day/7 Days ?Electronic Signature(s) ?Signed: 05/20/2021 3:30:56 PM By: Greg Mu RN ?Signed: 05/20/2021 4:00:03 PM By: Greg Kelp PA-C ?Entered By: Greg Quinn on 05/20/2021 09:34:27 ?-------------------------------------------------------------------------------- ?Problem List Details ?Patient Name: ?Date of Service: ?Greg Brownie ERKA HN J. 05/20/2021 9:30 A M ?Medical Record Number: [id number] ?Patient Account Number: [id number] ?Date of Birth/Sex: ?Treating RN: ?24-Dec-1970 (51 y.o. 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 05/20/2021 9:30 A M ?Medical Record Number: [id number] ?Patient Account Number: [id number] ?Date of Birth/Sex: ?Treating RN: ?Jul 21, 1970 (51 y.o. Greg Quinn ?Primary Care Provider: Jonah Quinn ?Other Clinician: ?Referring Provider: ?Treating Provider/Extender: Greg Quinn ?Greg Quinn ?Weeks in Treatment: 2 ?Subjective ?Chief Complaint ?Information obtained from Patient ?Scrotal Fournier gangrene ?History of  Present Illness (HPI) ?05-06-2021 upon evaluation today patient appears to be doing somewhat poorly in regard to an area over the scrotal area where he did have unfortunately a a ?small area of abscess which subsequently

## 2021-05-20 NOTE — Progress Notes (Signed)
GEOVANIE, WINNETT ([id number]) ?Visit Report for 05/20/2021 ?Arrival Information Details ?Patient Name: Date of Service: ?Pearlean Brownie ERKA HN J. 05/20/2021 9:30 A M ?Medical Record Number: [id number] ?Patient Account Number: [id number] ?Date of Birth/Sex: Treating RN: ?08-Jun-1970 (51 y.o. [name], [name] ?Primary Care Kyrel Leighton: Jonah Blue Other Clinician: ?Referring Airi Copado: ?Treating Romi Rathel/Extender: Lenda Kelp ?Jonah Blue ?Weeks in Treatment: 2 ?Visit Information History Since Last Visit ?Added or deleted any medications: No ?Patient Arrived: Ambulatory ?Any new allergies or adverse reactions: No ?Arrival Time: 09:29 ?Had a fall or experienced change in No ?Accompanied By: self ?activities of daily living that may affect ?Transfer Assistance: None ?risk of falls: ?Patient Identification Verified: Yes ?Signs or symptoms of abuse/neglect since last visito No ?Secondary Verification Process Completed: Yes ?Hospitalized since last visit: No ?Patient Has Alerts: Yes ?Implantable device outside of the clinic excluding No ?Patient Alerts: Patient on Blood Thinner ?cellular tissue based products placed in the center ?since last visit: ?Has Dressing in Place as Prescribed: Yes ?Pain Present Now: Yes ?Electronic Signature(s) ?Signed: 05/20/2021 3:30:56 PM By: Fonnie Mu RN ?Entered By: Fonnie Mu on 05/20/2021 09:30:19 ?-------------------------------------------------------------------------------- ?Clinic Level of Care Assessment Details ?Patient Name: Date of Service: ?Pearlean Brownie ERKA HN J. 05/20/2021 9:30 A M ?Medical Record Number: [id number] ?Patient Account Number: [id number] ?Date of Birth/Sex: Treating RN: ?1970/02/17 (51 y.o. [name], [name] ?Primary Care Halea Lieb: Jonah Blue Other Clinician: ?Referring Taneeka Curtner: ?Treating Kelso Bibby/Extender: Lenda Kelp ?Jonah Blue ?Weeks in Treatment: 2 ?Clinic Level of Care Assessment Items ?TOOL 4 Quantity  Score ?X- 1 0 ?Use when only an EandM is performed on FOLLOW-UP visit ?ASSESSMENTS - Nursing Assessment / Reassessment ?X- 1 10 ?Reassessment of Co-morbidities (includes updates in patient status) ?X- 1 5 ?Reassessment of Adherence to Treatment Plan ?ASSESSMENTS - Wound and Skin A ssessment / Reassessment ?X - Simple Wound Assessment / Reassessment - one wound 1 5 ?[]  - 0 ?Complex Wound Assessment / Reassessment - multiple wounds ?[]  - 0 ?Dermatologic / Skin Assessment (not related to wound area) ?ASSESSMENTS - Focused Assessment ?[]  - 0 ?Circumferential Edema Measurements - multi extremities ?[]  - 0 ?Nutritional Assessment / Counseling / Intervention ?[]  - 0 ?Lower Extremity Assessment (monofilament, tuning fork, pulses) ?[]  - 0 ?Peripheral Arterial Disease Assessment (using hand held doppler) ?ASSESSMENTS - Ostomy and/or Continence Assessment and Care ?[]  - 0 ?Incontinence Assessment and Management ?[]  - 0 ?Ostomy Care Assessment and Management (repouching, etc.) ?PROCESS - Coordination of Care ?X - Simple Patient / Family Education for ongoing care 1 15 ?[]  - 0 ?Complex (extensive) Patient / Family Education for ongoing care ?X- 1 10 ?Staff obtains Consents, Records, T Results / Process Orders ?est ?[]  - 0 ?Staff telephones HHA, Nursing Homes / Clarify orders / etc ?[]  - 0 ?Routine Transfer to another Facility (non-emergent condition) ?[]  - 0 ?Routine Hospital Admission (non-emergent condition) ?[]  - 0 ?New Admissions / / Ordering NPWT Apligraf, etc. ?, ?[]  - 0 ?Emergency Hospital Admission (emergent condition) ?X- 1 10 ?Simple Discharge Coordination ?[]  - 0 ?Complex (extensive) Discharge Coordination ?PROCESS - Special Needs ?[]  - 0 ?Pediatric / Minor Patient Management ?[]  - 0 ?Isolation Patient Management ?[]  - 0 ?Hearing / Language / Visual special needs ?[]  - 0 ?Assessment of Community assistance (transportation, D/C planning, etc.) ?[]  - 0 ?Additional assistance / Altered  mentation ?[]  - 0 ?Support Surface(s) Assessment (bed, cushion, seat, etc.) ?INTERVENTIONS - Wound Cleansing / Measurement ?X - Simple Wound Cleansing - one  wound 1 5 ?[]  - 0 ?Complex Wound Cleansing - multiple wounds ?X- 1 5 ?Wound Imaging (photographs - any number of wounds) ?[]  - 0 ?Wound Tracing (instead of photographs) ?X- 1 5 ?Simple Wound Measurement - one wound ?[]  - 0 ?Complex Wound Measurement - multiple wounds ?INTERVENTIONS - Wound Dressings ?X - Small Wound Dressing one or multiple wounds 1 10 ?[]  - 0 ?Medium Wound Dressing one or multiple wounds ?[]  - 0 ?Large Wound Dressing one or multiple wounds ?[]  - 0 ?Application of Medications - topical ?[]  - 0 ?Application of Medications - injection ?INTERVENTIONS - Miscellaneous ?[]  - 0 ?External ear exam ?[]  - 0 ?Specimen Collection (cultures, biopsies, blood, body fluids, etc.) ?[]  - 0 ?Specimen(s) / Culture(s) sent or taken to Lab for analysis ?[]  - 0 ?Patient Transfer (multiple staff / / Similar devices) ?[]  - 0 ?Simple Staple / Suture removal (25 or less) ?[]  - 0 ?Complex Staple / Suture removal (26 or more) ?[]  - 0 ?Hypo / Hyperglycemic Management (close monitor of Blood Glucose) ?[]  - 0 ?Ankle / Brachial Index (ABI) - do not check if billed separately ?X- 1 5 ?Vital Signs ?Has the patient been seen at the hospital within the last three years: Yes ?Total Score: 85 ?Level Of Care: New/Established - Level 3 ?Electronic Signature(s) ?Signed: 05/20/2021 3:30:56 PM By: RN ?Entered By: on 05/20/2021 09:35:48 ?-------------------------------------------------------------------------------- ?Encounter Discharge Information Details ?Patient Name: Date of Service: ? ERKA HN J. 05/20/2021 9:30 A M ?Medical Record Number: ?Patient Account Number: ?Date of Birth/Sex: Treating RN: ?01-02-1971 (51 y.o. Nurse, adult, [name] ?Primary Care Alyne Martinson: Other Clinician: ?Referring  Seon Gaertner: ?Treating Makell Cyr/Extender: ? ?Weeks in Treatment: 2 ?Encounter Discharge Information Items ?Discharge Condition: Stable ?Ambulatory Status: Ambulatory ?Discharge Destination: Home ?Transportation: Private Auto ?Accompanied By: self ?Schedule Follow-up Appointment: Yes ?Clinical Summary of Care: Patient Declined ?Electronic Signature(s) ?Signed: 05/20/2021 3:30:56 PM By: 05/22/2021 RN ?Entered By: Fonnie Mu on 05/20/2021 09:36:56 ?-------------------------------------------------------------------------------- ?Lower Extremity Assessment Details ?Patient Name: Date of Service: ?05/22/2021 ERKA HN J. 05/20/2021 9:30 A M ?Medical Record Number: 05/22/2021 ?Patient Account Number: [id number] ?Date of Birth/Sex: Treating RN: ?06/19/1970 (51 y.o. 44, [name] ?Primary Care Trayson Stitely: [name] Other Clinician: ?Referring Atiana Levier: ?Treating Aviyah Swetz/Extender: Jonah Blue ?Lenda Kelp ?Weeks in Treatment: 2 ?Electronic Signature(s) ?Signed: 05/20/2021 3:30:56 PM By: 05/22/2021 RN ?Entered By: Fonnie Mu on 05/20/2021 09:31:44 ?-------------------------------------------------------------------------------- ?Multi-Disciplinary Care Plan Details ?Patient Name: Date of Service: ?05/22/2021 ERKA HN J. 05/20/2021 9:30 A M ?Medical Record Number: 05/22/2021 ?Patient Account Number: [id number] ?Date of Birth/Sex: ?Treating RN: ?May 11, 1970 (51 y.o. 44, [name] ?Primary Care Anahli Arvanitis: [name] ?Other Clinician: ?Referring Janiyla Long: ?Treating Keianna Signer/Extender: Jonah Blue ?Lenda Kelp ?Weeks in Treatment: 2 ?Active Inactive ?Wound/Skin Impairment ?Nursing Diagnoses: ?Impaired tissue integrity ?Knowledge deficit related to ulceration/compromised skin integrity ?Goals: ?Patient will have a decrease in wound volume by X% from date: (specify in notes) ?Date Initiated: 05/06/2021 ?Target Resolution Date:  05/28/2021 ?Goal Status: Active ?Patient/caregiver will verbalize understanding of skin care regimen ?Date Initiated: 05/06/2021 ?Target Resolution Date: 05/30/2021 ?Goal Status: Active ?Ulcer/skin breakdown will have a volume

## 2021-05-27 ENCOUNTER — Encounter (HOSPITAL_BASED_OUTPATIENT_CLINIC_OR_DEPARTMENT_OTHER): Payer: BC Managed Care – PPO | Attending: Physician Assistant | Admitting: Physician Assistant

## 2021-05-27 DIAGNOSIS — E11622 Type 2 diabetes mellitus with other skin ulcer: Secondary | ICD-10-CM | POA: Diagnosis not present

## 2021-05-27 DIAGNOSIS — L98492 Non-pressure chronic ulcer of skin of other sites with fat layer exposed: Secondary | ICD-10-CM | POA: Diagnosis not present

## 2021-05-27 DIAGNOSIS — I10 Essential (primary) hypertension: Secondary | ICD-10-CM | POA: Insufficient documentation

## 2021-05-27 DIAGNOSIS — T8189XA Other complications of procedures, not elsewhere classified, initial encounter: Secondary | ICD-10-CM | POA: Diagnosis not present

## 2021-05-27 DIAGNOSIS — N493 Fournier gangrene: Secondary | ICD-10-CM | POA: Insufficient documentation

## 2021-05-27 NOTE — Progress Notes (Addendum)
CRIST, KRUSZKA ([id number]) ?Visit Report for 05/27/2021 ?Chief Complaint Document Details ?Patient Name: Date of Service: ?Greg Brownie ERKA HN J. 05/27/2021 10:00 A M ?Medical Record Number: [id number] ?Patient Account Number: [id number] ?Date of Birth/Sex: Treating RN: ?May 03, 1970 (51 y.o. Greg Quinn, [name] ?Primary Care Provider: Jonah Blue Other Clinician: ?Referring Provider: ?Treating Provider/Extender: Lenda Kelp ?Jonah Blue ?Weeks in Treatment: 3 ?Information Obtained from: Patient ?Chief Complaint ?Scrotal Fournier gangrene ?Electronic Signature(s) ?Signed: 05/27/2021 10:32:45 AM By: Lenda Kelp PA-C ?Entered By: Lenda Kelp on 05/27/2021 10:32:45 ?-------------------------------------------------------------------------------- ?HPI Details ?Patient Name: Date of Service: ?Greg Brownie ERKA HN J. 05/27/2021 10:00 A M ?Medical Record Number: [id number] ?Patient Account Number: [id number] ?Date of Birth/Sex: Treating RN: ?July 12, 1970 (51 y.o. Greg Quinn, [name] ?Primary Care Provider: Jonah Blue Other Clinician: ?Referring Provider: ?Treating Provider/Extender: Lenda Kelp ?Jonah Blue ?Weeks in Treatment: 3 ?History of Present Illness ?HPI Description: 05-06-2021 upon evaluation today patient appears to be doing somewhat poorly in regard to an area over the scrotal area where he did have ?unfortunately a a small area of abscess which subsequently developed into 40 gangrene. Subsequently he ended up having emergency incision and drainage ?at the emergency room and then subsequently was placed in the hospital for monitoring and antibiotics. Before discharge they discontinued packing and just ?had him put Steri-Strips over the area. With that being said I do not think that is going to be appropriate from a long-term perspective. It was also documented ?in the note that the patient did have a hemoglobin A1c of 10. He also was put on IV insulin in the hospital. This  all began on 25 March when he thought he just ?had an ingrown hair. There was a culture performed and did show group B strep and staph but not MRSA. He has been on Augmentin he has another 8 days of ?that as well to continue at this point. He is not having as much pain as it was in the beginning but is still kind of leery which is completely understandable. ?05-13-2021 upon evaluation today patient actually appears to be showing signs of improvement which is great news. There was a mixup with getting him his ?supplies apparently the order did not push through until after already signed everything Friday afternoon and subsequently it did not get signed. Nonetheless I ?do believe that at this time the patient is definitely doing much better though and I do believe that with the supplies and what he needs to continue to treat this ?he should do quite well. ?05-20-2021 upon evaluation today patient's wound is actually showing signs of excellent improvement which is great news and overall I do not see any evidence ?of active infection locally or systemically which is great news. In general I think we are definitely headed in the right direction here. ?05-27-2021 upon evaluation today patient appears to be doing well with regard to his wound. This is actually significantly smaller and seems to be healing quite ?nicely. I do not see any evidence of active infection locally nor systemically which is great news. No fevers, chills, nausea, vomiting, or diarrhea. ?Electronic Signature(s) ?Signed: 05/27/2021 10:49:34 AM By: Lenda Kelp PA-C ?Entered By: Lenda Kelp on 05/27/2021 10:49:34 ?-------------------------------------------------------------------------------- ?Physical Exam Details ?Patient Name: Date of Service: ?Greg Brownie ERKA HN J. 05/27/2021 10:00 A M ?Medical Record Number: [id number] ?Patient Account Number: [id number] ?Date of Birth/Sex: Treating RN: ?05/10/1970 (51 y.o. Greg Quinn, [name] ?Primary Care  Provider: Jonah Blue Other Clinician: ?Referring Provider: ?Treating Provider/Extender: Lenda Kelp ?Jonah Blue ?Weeks in Treatment: 3 ?Constitutional ?Well-nourished and well-hydrated in no acute distress. ?Respiratory ?normal breathing without difficulty. ?Psychiatric ?this patient is able to make decisions and demonstrates good insight into disease process. Alert and Oriented x 3. pleasant and cooperative. ?Notes ?Upon inspection patient's wound bed actually showed signs of excellent granulation and epithelization at this point. There does not appear to be any evidence of ?infection locally or systemically which is great news and overall I think you are making excellent progress. In general I think that the patient is tolerating the ?dressing changes without complication. I know he is ready for this to be done. ?Electronic Signature(s) ?Signed: 05/27/2021 10:49:56 AM By: Lenda Kelp PA-C ?Entered By: Lenda Kelp on 05/27/2021 10:49:56 ?-------------------------------------------------------------------------------- ?Physician Orders Details ?Patient Name: Date of Service: ?Greg Brownie ERKA HN J. 05/27/2021 10:00 A M ?Medical Record Number: [id number] ?Patient Account Number: [id number] ?Date of Birth/Sex: Treating RN: ?1970/07/12 (51 y.o. Greg Quinn, [name] ?Primary Care Provider: Jonah Blue Other Clinician: ?Referring Provider: ?Treating Provider/Extender: Lenda Kelp ?Jonah Blue ?Weeks in Treatment: 3 ?Verbal / Phone Orders: No ?Diagnosis Coding ?ICD-10 Coding ?Code Description ?N49.3 Fournier gangrene ?L98.492 Non-pressure chronic ulcer of skin of other sites with fat layer exposed ?E11.622 Type 2 diabetes mellitus with other skin ulcer ?I10 Essential (primary) hypertension ?Follow-up Appointments ?ppointment in 1 week. - w/ Aldona Lento, RN Room # 9 ?Return A ?Bathing/ Shower/ Hygiene ?May shower with protection but do not get wound dressing(s) wet. ?Wound  Treatment ?Wound #1 - Scrotum ?Cleanser: Soap and Water 1 x Per Day/15 Days ?Discharge Instructions: May shower and wash wound with dial antibacterial soap and water prior to dressing change. ?Cleanser: Wound Cleanser (DME) (Generic) 1 x Per Day/15 Days ?Discharge Instructions: Cleanse the wound with wound cleanser prior to applying a clean dressing using gauze sponges, not tissue or cotton balls. ?Cleanser: Vashe 5.8 (oz) (DME) (Generic) 1 x Per Day/15 Days ?Discharge Instructions: Moisten 4 x 4 gauze with the vashe and pack into wound!! ?Prim Dressing: vashe soaked gauze (Generic) 1 x Per Day/15 Days ?ary ?Secondary Dressing: Woven Gauze Sponge, Non-Sterile 4x4 in (DME) (Generic) 1 x Per Day/15 Days ?Discharge Instructions: Apply over primary dressing as directed. ?Secondary Dressing: ALLEVYN Gentle Border, 5x5 (in/in) (DME) (Generic) 1 x Per Day/15 Days ?Discharge Instructions: Apply over primary dressing as directed. ?Electronic Signature(s) ?Signed: 05/27/2021 4:32:22 PM By: Lenda Kelp PA-C ?Signed: 05/28/2021 4:12:14 PM By: Fonnie Mu RN ?Entered By: Fonnie Mu on 05/27/2021 10:46:07 ?-------------------------------------------------------------------------------- ?Problem List Details ?Patient Name: ?Date of Service: ?Greg Brownie ERKA HN J. 05/27/2021 10:00 A M ?Medical Record Number: [id number] ?Patient Account Number: [id number] ?Date of Birth/Sex: ?Treating RN: ?12-23-1970 (51 y.o. Greg Quinn, [name] ?Primary Care Provider: Jonah Blue ?Other Clinician: ?Referring Provider: ?Treating Provider/Extender: Lenda Kelp ?Jonah Blue ?Weeks in Treatment: 3 ?Active Problems ?ICD-10 ?Encounter ?Code Description Active Date MDM ?Diagnosis ?N49.3 Fournier gangrene 05/06/2021 No Yes ?D42.876 Non-pressure chronic ulcer of skin of other sites with fat layer exposed 05/06/2021 No Yes ?E11.622 Type 2 diabetes mellitus with other skin ulcer 05/06/2021 No Yes ?I10 Essential (primary)  hypertension 05/06/2021 No Yes ?Inactive Problems ?Resolved Problems ?Electronic Signature(s) ?Signed: 05/27/2021 10:32:22 AM By: Lenda Kelp PA-C ?Entered By: Lenda Kelp on 05/27/2021 10:32:21 ?-------------------

## 2021-06-02 ENCOUNTER — Ambulatory Visit: Payer: BC Managed Care – PPO | Attending: Internal Medicine | Admitting: Pharmacist

## 2021-06-02 NOTE — Progress Notes (Deleted)
Office Visit    Patient Name: Greg Quinn Date of Encounter: 06/03/2021  Primary Care Provider:  Ladell Pier, MD Primary Cardiologist:  Sanda Klein, MD  Chief Complaint    51 year old male with a history of CAD s/p NSTEMI, s/p DES-mLCx in 2022, PE/DVT s/p IVC filter, HIT, hypertension, hyperlipidemia, type 2 diabetes, obesity, Fournier's gangrene, and tobacco use who presents for follow-up related to CAD.  Past Medical History    Past Medical History:  Diagnosis Date   Bilateral pulmonary embolism ([place])    2009, on xarelto, IVC filter   Coronary artery disease    Current every day smoker    25 pack year history   DVT, lower extremity ([place])    2009   Hyperlipidemia LDL goal <70    Hypertension    NSTEMI (non-ST elevated myocardial infarction) ([place]) 08/20/2020   NSTEMI (non-ST elevated myocardial infarction) ([place]) 08/20/2020   Uncontrolled diabetes mellitus    Past Surgical History:  Procedure Laterality Date   CORONARY STENT INTERVENTION N/A 08/20/2020   Procedure: CORONARY STENT INTERVENTION;  Surgeon: Jettie Booze, MD;  Location: [facility];  Service: Cardiovascular;  Laterality: N/A;   CYSTOSCOPY N/A 04/23/2021   Procedure: CYSTOSCOPY;  Surgeon: Bjorn Loser, MD;  Location: [facility];  Service: Urology;  Laterality: N/A;   INCISION AND DRAINAGE ABSCESS N/A 04/23/2021   Procedure: INCISION AND DRAINAGE ABSCESS;  Surgeon: Bjorn Loser, MD;  Location: [facility];  Service: Urology;  Laterality: N/A;   INTRAVASCULAR ULTRASOUND/IVUS N/A 08/20/2020   Procedure: Intravascular Ultrasound/IVUS;  Surgeon: Jettie Booze, MD;  Location: [facility];  Service: Cardiovascular;  Laterality: N/A;   LEFT HEART CATH AND CORONARY ANGIOGRAPHY N/A 08/20/2020   Procedure: LEFT HEART CATH AND CORONARY ANGIOGRAPHY;  Surgeon: Jettie Booze, MD;  Location: [facility];  Service: Cardiovascular;  Laterality: N/A;     Allergies  Allergies  Allergen Reactions   Heparin Other (See Comments)   Pork-Derived Products     History of Present Illness    51 year old male with the above past medical history including CAD s/p NSTEMI, s/p DES-mLCx in 2022, PE/DVT s/p IVC filter, HIT, hypertension, hyperlipidemia, type 2 diabetes, Fournier's gangrene, obesity, and tobacco use.  He has a history of extensive DVT in his left lower extremity, right lower extremity, and bilateral PE s/p IVC filter, on Xarelto. Stress test in 2017 in [place] was reportedly normal.  He was hospitalized in July 2022 in the setting of NSTEMI. Cardiac catheterization revealed mLCx 90-0% s/p DES, dRCA 40%, and p-mLAD 25% stenosis, EF 55-65%, mildly elevated LVEDP. Echocardiogram at the time showed EF 60 to 65%, no RWMA, mild LVH, no significant valvular abnormalities.  He has not been seen in follow-up since.  He was hospitalized in April 2023 in the setting of scrotal infection with Fournier's gangrene.  ID and urology were consulted and he was treated with antibiotics, s/p I&D of scrotum and debridement of necrotic tissue with irrigation Foley catheter.   He presents today for follow-up.  Since his hospitalization and since his last visit  CAD: Cath in 2022 showed mLCx 90-0% s/p DES, dRCA 40%, and p-mLAD 25% stenosis, EF 55-65%, mildly elevated LVEDP. Echo showed EF 60 to 65%, no RWMA, mild LVH, no significant valvular abnormalities. Hypertension: BP well controlled. Continue current antihypertensive regimen.   Hyperlipidemia: LDL was 93 in 07/2020. Due for repeat lipids.  H/o PE/DVT: On Xarelto. Managed per PCP.  Type 2 diabetes:  A1C was 10.7 in 03/2021. Monitored and managed per PCP.  Fournier's gangrene: Following with ID and urology.  Disposition: Follow-up in   Home Medications    Current Outpatient Medications  Medication Sig Dispense Refill   Accu-Chek Softclix Lancets lancets Use up to 4 times daily as directed 100 each 0    albuterol (VENTOLIN HFA) 108 (90 Base) MCG/ACT inhaler Inhale 1-2 puffs into the lungs 4 (four) times daily as needed for wheezing or shortness of breath. 6.7 g 0   Ascorbic Acid (VITAMIN C) 1000 MG tablet Take 1,000 mg by mouth 2 (two) times daily.     atorvastatin (LIPITOR) 80 MG tablet Take 1 tablet (80 mg total) by mouth daily. 90 tablet 4   Blood Glucose Monitoring Suppl (BLOOD GLUCOSE MONITOR SYSTEM) w/Device KIT Use as directed 1 kit 0   clopidogrel (PLAVIX) 75 MG tablet Take 1 tablet (75 mg total) by mouth daily. 30 tablet 6   diclofenac Sodium (VOLTAREN) 1 % GEL Apply 4 g topically 4 (four) times daily. (Patient taking differently: Apply 4 g topically 3 (three) times daily.) 100 g 0   glucose blood test strip Use up to 4 times daily 100 each 0   HYDROcodone-acetaminophen (NORCO/VICODIN) 5-325 MG tablet Take 1 tablet by mouth 2 (two) times daily as needed for moderate pain. 20 tablet 0   hydrocortisone cream 1 % Apply topically 2 (two) times daily. 28 g 0   insulin detemir (LEVEMIR) 100 UNIT/ML FlexPen Inject 35 Units into the skin daily. 15 mL 5   Insulin Pen Needle 32G X 4 MM MISC use as directed daily with insulin 100 each 0   lisinopril (ZESTRIL) 20 MG tablet Take 1 tablet (20 mg total) by mouth daily. 90 tablet 1   metFORMIN (GLUCOPHAGE-XR) 750 MG 24 hr tablet Take 1 tablet (750 mg total) by mouth 2 (two) times daily. 180 tablet 3   metoprolol succinate (TOPROL-XL) 25 MG 24 hr tablet Take 1 tablet (25 mg total) by mouth daily. 90 tablet 3   nitroGLYCERIN (NITROSTAT) 0.4 MG SL tablet Place 1 tablet (0.4 mg total) under the tongue every 5 (five) minutes as needed for chest pain. 20 tablet 1   XARELTO 20 MG TABS tablet Take 1 tablet (20 mg total) by mouth daily. 30 tablet 4   No current facility-administered medications for this visit.     Review of Systems    ***.  All other systems reviewed and are otherwise negative except as noted above.    Physical Exam    VS:  There were no  vitals taken for this visit. , BMI There is no height or weight on file to calculate BMI.     GEN: Well nourished, well developed, in no acute distress. HEENT: normal. Neck: Supple, no JVD, carotid bruits, or masses. Cardiac: RRR, no murmurs, rubs, or gallops. No clubbing, cyanosis, edema.  Radials/DP/PT 2+ and equal bilaterally.  Respiratory:  Respirations regular and unlabored, clear to auscultation bilaterally. GI: Soft, nontender, nondistended, BS + x 4. MS: no deformity or atrophy. Skin: warm and dry, no rash. Neuro:  Strength and sensation are intact. Psych: Normal affect.  Accessory Clinical Findings    ECG personally reviewed by me today - *** - no acute changes.  Lab Results  Component Value Date   WBC 9.3 05/01/2021   HGB 13.5 05/01/2021   HCT 39.4 05/01/2021   MCV 87.0 05/01/2021   PLT 278 05/01/2021   Lab Results  Component Value Date  CREATININE 0.81 05/01/2021   BUN 9 05/01/2021   NA 136 05/01/2021   K 4.1 05/01/2021   CL 103 05/01/2021   CO2 28 05/01/2021   Lab Results  Component Value Date   ALT 35 04/30/2021   AST 23 04/30/2021   ALKPHOS 103 04/30/2021   BILITOT 0.2 (L) 04/30/2021   Lab Results  Component Value Date   CHOL 146 08/21/2020   HDL 42 08/21/2020   LDLCALC 93 08/21/2020   TRIG 57 08/21/2020   CHOLHDL 3.5 08/21/2020    Lab Results  Component Value Date   HGBA1C 10.7 (H) 04/23/2021    Assessment & Plan    1.  ***   Lenna Sciara, NP 06/03/2021, 7:50 AM

## 2021-06-03 ENCOUNTER — Encounter (HOSPITAL_BASED_OUTPATIENT_CLINIC_OR_DEPARTMENT_OTHER): Payer: BC Managed Care – PPO | Admitting: Physician Assistant

## 2021-06-03 ENCOUNTER — Ambulatory Visit: Payer: BC Managed Care – PPO | Admitting: Nurse Practitioner

## 2021-06-03 DIAGNOSIS — I10 Essential (primary) hypertension: Secondary | ICD-10-CM | POA: Diagnosis not present

## 2021-06-03 DIAGNOSIS — L98492 Non-pressure chronic ulcer of skin of other sites with fat layer exposed: Secondary | ICD-10-CM | POA: Diagnosis not present

## 2021-06-03 DIAGNOSIS — E11622 Type 2 diabetes mellitus with other skin ulcer: Secondary | ICD-10-CM | POA: Diagnosis not present

## 2021-06-03 DIAGNOSIS — T8189XA Other complications of procedures, not elsewhere classified, initial encounter: Secondary | ICD-10-CM | POA: Diagnosis not present

## 2021-06-03 DIAGNOSIS — N493 Fournier gangrene: Secondary | ICD-10-CM | POA: Diagnosis not present

## 2021-06-03 NOTE — Progress Notes (Addendum)
Greg Quinn ([id number]) ?Visit Report for 06/03/2021 ?Chief Complaint Document Details ?Patient Name: Date of Service: ?Greg Quinn Greg HN J. 06/03/2021 10:45 A M ?Medical Record Number: [id number] ?Patient Account Number: [id number] ?Date of Birth/Sex: Treating RN: ?1970/05/07 (51 y.o. Greg Quinn ?Primary Care Provider: Karle Plumber Other Clinician: ?Referring Provider: ?Treating Provider/Extender: Worthy Keeler ?Karle Plumber ?Weeks in Treatment: 4 ?Information Obtained from: Patient ?Chief Complaint ?Scrotal Fournier gangrene ?Electronic Signature(s) ?Signed: 06/03/2021 11:14:05 AM By: Worthy Keeler PA-C ?Entered By: Worthy Keeler on 06/03/2021 11:14:05 ?-------------------------------------------------------------------------------- ?HPI Details ?Patient Name: Date of Service: ?Greg Quinn Greg HN J. 06/03/2021 10:45 A M ?Medical Record Number: [id number] ?Patient Account Number: [id number] ?Date of Birth/Sex: Treating RN: ?March 25, 1970 (51 y.o. Greg Quinn ?Primary Care Provider: Karle Plumber Other Clinician: ?Referring Provider: ?Treating Provider/Extender: Worthy Keeler ?Karle Plumber ?Weeks in Treatment: 4 ?History of Present Illness ?HPI Description: 05-06-2021 upon evaluation today patient appears to be doing somewhat poorly in regard to an area over the scrotal area where he did have ?unfortunately a a small area of abscess which subsequently developed into 40 gangrene. Subsequently he ended up having emergency incision and drainage ?at the emergency room and then subsequently was placed in the hospital for monitoring and antibiotics. Before discharge they discontinued packing and just ?had him put Steri-Strips over the area. With that being said I do not think that is going to be appropriate from a long-term perspective. It was also documented ?in the note that the patient did have a hemoglobin A1c of 10. He also was put on IV insulin in the hospital.  This all began on 25 March when he thought he just ?had an ingrown hair. There was a culture performed and did show group B strep and staph but not MRSA. He has been on Augmentin he has another 8 days of ?that as well to continue at this point. He is not having as much pain as it was in the beginning but is still kind of leery which is completely understandable. ?05-13-2021 upon evaluation today patient actually appears to be showing signs of improvement which is great news. There was a mixup with getting him his ?supplies apparently the order did not push through until after already signed everything Friday afternoon and subsequently it did not get signed. Nonetheless I ?do believe that at this time the patient is definitely doing much better though and I do believe that with the supplies and what he needs to continue to treat this ?he should do quite well. ?05-20-2021 upon evaluation today patient's wound is actually showing signs of excellent improvement which is great news and overall I do not see any evidence ?of active infection locally or systemically which is great news. In general I think we are definitely headed in the right direction here. ?05-27-2021 upon evaluation today patient appears to be doing well with regard to his wound. This is actually significantly smaller and seems to be healing quite ?nicely. I do not see any evidence of active infection locally nor systemically which is great news. No fevers, chills, nausea, vomiting, or diarrhea. ?06-03-2021 upon evaluation today patient is almost completely healed. He has been tolerating the dressing changes without complication and fortunately I do ?not see any evidence of active infection locally or systemically which is great news. No fevers, chills, nausea, vomiting, or diarrhea. ?Electronic Signature(s) ?Signed: 06/03/2021 5:53:46 PM By: Worthy Keeler PA-C ?Entered By: Worthy Keeler on 06/03/2021  17:53:46 ?-------------------------------------------------------------------------------- ?Physical Exam Details ?Patient Name: Date of Service: ?Greg Quinn Greg HN J. 06/03/2021 10:45 A M ?Medical Record Number: [id number] ?Patient Account Number: [id number] ?Date of Birth/Sex: Treating RN: ?[birth year] (51 y.o. Greg Quinn ?Primary Care Provider: Karle Plumber Other Clinician: ?Referring Provider: ?Treating Provider/Extender: Worthy Keeler ?Karle Plumber ?Weeks in Treatment: 4 ?Constitutional ?Well-nourished and well-hydrated in no acute distress. ?Respiratory ?normal breathing without difficulty. ?Psychiatric ?this patient is able to make decisions and demonstrates good insight into disease process. Alert and Oriented x 3. pleasant and cooperative. ?Notes ?Upon inspection patient's wound again is barely open and overall I think that he is making all some progress. I do not see any signs of worsening overall and I ?think that in general he is doing extremely well hopefully he will be ready for discharge come next week. ?Electronic Signature(s) ?Signed: 06/03/2021 5:54:05 PM By: Worthy Keeler PA-C ?Entered By: Worthy Keeler on 06/03/2021 17:54:05 ?-------------------------------------------------------------------------------- ?Physician Orders Details ?Patient Name: Date of Service: ?Greg Quinn Greg HN J. 06/03/2021 10:45 A M ?Medical Record Number: [id number] ?Patient Account Number: [id number] ?Date of Birth/Sex: Treating RN: ?Jul 10, 1970 (51 y.o. Greg Quinn ?Primary Care Provider: Karle Plumber Other Clinician: ?Referring Provider: ?Treating Provider/Extender: Worthy Keeler ?Karle Plumber ?Weeks in Treatment: 4 ?Verbal / Phone Orders: No ?Diagnosis Coding ?ICD-10 Coding ?Code Description ?N49.3 Fournier gangrene ?L98.492 Non-pressure chronic ulcer of skin of other sites with fat layer exposed ?E11.622 Type 2 diabetes mellitus with other skin ulcer ?I10 Essential  (primary) hypertension ?Follow-up Appointments ?ppointment in 1 week. - w/ Toney Rakes, RN Room # 9 ?Return A ?Bathing/ Shower/ Hygiene ?May shower with protection but do not get wound dressing(s) wet. ?Wound Treatment ?Wound #1 - Scrotum ?Cleanser: Soap and Water 1 x Per Day/15 Days ?Discharge Instructions: May shower and wash wound with dial antibacterial soap and water prior to dressing change. ?Cleanser: Wound Cleanser (Generic) 1 x Per Day/15 Days ?Discharge Instructions: Cleanse the wound with wound cleanser prior to applying a clean dressing using gauze sponges, not tissue or cotton balls. ?Prim Dressing: Xeroform Occlusive Gauze Dressing, 4x4 in 1 x Per Day/15 Days ?ary ?Discharge Instructions: Apply to wound bed as instructed ?Secondary Dressing: ALLEVYN Gentle Border, 5x5 (in/in) (Generic) 1 x Per Day/15 Days ?Discharge Instructions: Apply over primary dressing as directed. ?Secondary Dressing: Woven Gauze Sponge, Non-Sterile 4x4 in (Generic) 1 x Per Day/15 Days ?Discharge Instructions: Apply over primary dressing as directed. ?Electronic Signature(s) ?Signed: 06/03/2021 5:07:52 PM By: Rhae Hammock RN ?Signed: 06/03/2021 6:03:33 PM By: Worthy Keeler PA-C ?Entered By: Rhae Hammock on 06/03/2021 11:43:46 ?-------------------------------------------------------------------------------- ?Problem List Details ?Patient Name: ?Date of Service: ?Greg Quinn Greg HN J. 06/03/2021 10:45 A M ?Medical Record Number: [id number] ?Patient Account Number: [id number] ?Date of Birth/Sex: ?Treating RN: ?05-09-1970 (51 y.o. Greg Quinn ?Primary Care Provider: Karle Plumber ?Other Clinician: ?Referring Provider: ?Treating Provider/Extender: Worthy Keeler ?Karle Plumber ?Weeks in Treatment: 4 ?Active Problems ?ICD-10 ?Encounter ?Code Description Active Date MDM ?Diagnosis ?N49.3 Fournier gangrene 05/06/2021 No Yes ?[id number] Non-pressure chronic ulcer of skin of other sites with fat layer exposed  05/06/2021 No Yes ?E11.622 Type 2 diabetes mellitus with other skin ulcer 05/06/2021 No Yes ?I10 Essential (primary) hypertension 05/06/2021 No Yes ?Inactive Problems ?Resolved Problems ?Electronic Signature(s) ?Signed: 06/03/2021 11:13:56 AM By: Worthy Keeler PA-

## 2021-06-09 DIAGNOSIS — S3130XA Unspecified open wound of scrotum and testes, initial encounter: Secondary | ICD-10-CM | POA: Diagnosis not present

## 2021-06-10 ENCOUNTER — Encounter (HOSPITAL_BASED_OUTPATIENT_CLINIC_OR_DEPARTMENT_OTHER): Payer: BC Managed Care – PPO | Admitting: Physician Assistant

## 2021-06-10 DIAGNOSIS — E11622 Type 2 diabetes mellitus with other skin ulcer: Secondary | ICD-10-CM | POA: Diagnosis not present

## 2021-06-10 DIAGNOSIS — N493 Fournier gangrene: Secondary | ICD-10-CM | POA: Diagnosis not present

## 2021-06-10 DIAGNOSIS — L98492 Non-pressure chronic ulcer of skin of other sites with fat layer exposed: Secondary | ICD-10-CM | POA: Diagnosis not present

## 2021-06-10 DIAGNOSIS — I10 Essential (primary) hypertension: Secondary | ICD-10-CM | POA: Diagnosis not present

## 2021-06-10 DIAGNOSIS — T8189XA Other complications of procedures, not elsewhere classified, initial encounter: Secondary | ICD-10-CM | POA: Diagnosis not present

## 2021-06-10 NOTE — Progress Notes (Addendum)
Greg, Quinn ([id number]) Visit Report for 06/10/2021 Chief Complaint Document Details Patient Name: Date of Service: Greg Quinn [facility] HN J. 06/10/2021 10:45 A M Medical Record Number: [id number] Patient Account Number: [id number] Date of Birth/Sex: Treating RN: 1970-07-08 (51 y.o. Greg Quinn, [name] Primary Care Provider: Jonah Blue Other Clinician: Referring Provider: Treating Provider/Extender: Sinclair Ship in Treatment: 5 Information Obtained from: Patient Chief Complaint Scrotal Fournier gangrene Electronic Signature(s) Signed: 06/10/2021 11:09:44 AM By: Lenda Kelp PA-C Entered By: Lenda Kelp on 06/10/2021 11:09:44 -------------------------------------------------------------------------------- HPI Details Patient Name: Date of Service: Greg Quinn [facility] HN J. 06/10/2021 10:45 A M Medical Record Number: [id number] Patient Account Number: [id number] Date of Birth/Sex: Treating RN: [birth year] (51 y.o. Greg Quinn, [name] Primary Care Provider: Jonah Blue Other Clinician: Referring Provider: Treating Provider/Extender: Sinclair Ship in Treatment: 5 History of Present Illness HPI Description: 05-06-2021 upon evaluation today patient appears to be doing somewhat poorly in regard to an area over the scrotal area where he did have unfortunately a a small area of abscess which subsequently developed into 40 gangrene. Subsequently he ended up having emergency incision and drainage at the emergency room and then subsequently was placed in the hospital for monitoring and antibiotics. Before discharge they discontinued packing and just had him put Steri-Strips over the area. With that being said I do not think that is going to be appropriate from a long-term perspective. It was also documented in the note that the patient did have a hemoglobin A1c of 10. He also was put on IV insulin in the hospital.  This all began on 25 March when he thought he just had an ingrown hair. There was a culture performed and did show group B strep and staph but not MRSA. He has been on Augmentin he has another 8 days of that as well to continue at this point. He is not having as much pain as it was in the beginning but is still kind of leery which is completely understandable. 05-13-2021 upon evaluation today patient actually appears to be showing signs of improvement which is great news. There was a mixup with getting him his supplies apparently the order did not push through until after already signed everything Friday afternoon and subsequently it did not get signed. Nonetheless I do believe that at this time the patient is definitely doing much better though and I do believe that with the supplies and what he needs to continue to treat this he should do quite well. 05-20-2021 upon evaluation today patient's wound is actually showing signs of excellent improvement which is great news and overall I do not see any evidence of active infection locally or systemically which is great news. In general I think we are definitely headed in the right direction here. 05-27-2021 upon evaluation today patient appears to be doing well with regard to his wound. This is actually significantly smaller and seems to be healing quite nicely. I do not see any evidence of active infection locally nor systemically which is great news. No fevers, chills, nausea, vomiting, or diarrhea. 06-03-2021 upon evaluation today patient is almost completely healed. He has been tolerating the dressing changes without complication and fortunately I do not see any evidence of active infection locally or systemically which is great news. No fevers, chills, nausea, vomiting, or diarrhea. 06-10-2021 upon evaluation today patient actually appears to be doing also in regard to his wound in fact  this appears to be completely healed which is great news. Fortunately I  do not see any evidence of infection locally or systemically which is great news. Electronic Signature(s) Signed: 06/10/2021 11:17:56 AM By: Lenda Kelp PA-C Entered By: Lenda Kelp on 06/10/2021 11:17:56 -------------------------------------------------------------------------------- Physical Exam Details Patient Name: Date of Service: Greg Quinn [facility] J. 06/10/2021 10:45 A M Medical Record Number: [id number] Patient Account Number: [id number] Date of Birth/Sex: Treating RN: 1970-09-16 (51 y.o. Greg Quinn Primary Care Provider: Jonah Blue Other Clinician: Referring Provider: Treating Provider/Extender: Sinclair Ship in Treatment: 5 Constitutional Well-nourished and well-hydrated in no acute distress. Respiratory normal breathing without difficulty. Psychiatric this patient is able to make decisions and demonstrates good insight into disease process. Alert and Oriented x 3. pleasant and cooperative. Notes Upon inspection patient's wound bed showed evidence of good granulation and epithelization at this point. In fact this is completely closed I assumed it probably was last week but I was not 100% sure it is definitely so this week. Electronic Signature(s) Signed: 06/10/2021 11:18:11 AM By: Lenda Kelp PA-C Entered By: Lenda Kelp on 06/10/2021 11:18:10 -------------------------------------------------------------------------------- Physician Orders Details Patient Name: Date of Service: Greg Quinn [facility] J. 06/10/2021 10:45 A M Medical Record Number: [id number] Patient Account Number: [id number] Date of Birth/Sex: Treating RN: Feb 19, 1970 (51 y.o. Greg Quinn, [name] Primary Care Provider: Jonah Blue Other Clinician: Referring Provider: Treating Provider/Extender: Sinclair Ship in Treatment: 5 Verbal / Phone Orders: No Diagnosis Coding ICD-10 Coding Code Description N49.3  Fournier gangrene [phone] Non-pressure chronic ulcer of skin of other sites with fat layer exposed E11.622 Type 2 diabetes mellitus with other skin ulcer I10 Essential (primary) hypertension Discharge From [facility] Services Discharge from Wound Care Center Electronic Signature(s) Signed: 06/10/2021 4:18:49 PM By: Lenda Kelp PA-C Signed: 06/11/2021 4:40:18 PM By: Fonnie Mu RN Entered By: Fonnie Mu on 06/10/2021 11:22:33 -------------------------------------------------------------------------------- Problem List Details Patient Name: Date of Service: Greg Quinn [facility] J. 06/10/2021 10:45 A M Medical Record Number: [id number] Patient Account Number: [id number] Date of Birth/Sex: Treating RN: 07-14-1970 (51 y.o. Greg Quinn, [name] Primary Care Provider: Jonah Blue Other Clinician: Referring Provider: Treating Provider/Extender: Sinclair Ship in Treatment: 5 Active Problems ICD-10 Encounter Code Description Active Date MDM Diagnosis N49.3 Fournier gangrene 05/06/2021 No Yes L98.492 Non-pressure chronic ulcer of skin of other sites with fat layer exposed 05/06/2021 No Yes E11.622 Type 2 diabetes mellitus with other skin ulcer 05/06/2021 No Yes I10 Essential (primary) hypertension 05/06/2021 No Yes Inactive Problems Resolved Problems Electronic Signature(s) Signed: 06/10/2021 11:09:37 AM By: Lenda Kelp PA-C Entered By: Lenda Kelp on 06/10/2021 11:09:37 -------------------------------------------------------------------------------- Progress Note Details Patient Name: Date of Service: Greg Quinn [facility] J. 06/10/2021 10:45 A M Medical Record Number: [id number] Patient Account Number: [id number] Date of Birth/Sex: Treating RN: 02/28/1970 (51 y.o. Greg Quinn, [name] Primary Care Provider: Jonah Blue Other Clinician: Referring Provider: Treating Provider/Extender: Sinclair Ship in  Treatment: 5 Subjective Chief Complaint Information obtained from Patient Scrotal Fournier gangrene History of Present Illness (HPI) 05-06-2021 upon evaluation today patient appears to be doing somewhat poorly in regard to an area over the scrotal area where he did have unfortunately a a small area of abscess which subsequently developed into 40 gangrene. Subsequently he ended up having emergency incision and drainage at the emergency room and then  subsequently was placed in the hospital for monitoring and antibiotics. Before discharge they discontinued packing and just had him put Steri- Strips over the area. With that being said I do not think that is going to be appropriate from a long-term perspective. It was also documented in the note that the patient did have a hemoglobin A1c of 10. He also was put on IV insulin in the hospital. This all began on 25 March when he thought he just had an ingrown hair. There was a culture performed and did show group B strep and staph but not MRSA. He has been on Augmentin he has another 8 days of that as well to continue at this point. He is not having as much pain as it was in the beginning but is still kind of leery which is completely understandable. 05-13-2021 upon evaluation today patient actually appears to be showing signs of improvement which is great news. There was a mixup with getting him his supplies apparently the order did not push through until after already signed everything Friday afternoon and subsequently it did not get signed. Nonetheless I do believe that at this time the patient is definitely doing much better though and I do believe that with the supplies and what he needs to continue to treat this he should do quite well. 05-20-2021 upon evaluation today patient's wound is actually showing signs of excellent improvement which is great news and overall I do not see any evidence of active infection locally or systemically which is great  news. In general I think we are definitely headed in the right direction here. 05-27-2021 upon evaluation today patient appears to be doing well with regard to his wound. This is actually significantly smaller and seems to be healing quite nicely. I do not see any evidence of active infection locally nor systemically which is great news. No fevers, chills, nausea, vomiting, or diarrhea. 06-03-2021 upon evaluation today patient is almost completely healed. He has been tolerating the dressing changes without complication and fortunately I do not see any evidence of active infection locally or systemically which is great news. No fevers, chills, nausea, vomiting, or diarrhea. 06-10-2021 upon evaluation today patient actually appears to be doing also in regard to his wound in fact this appears to be completely healed which is great news. Fortunately I do not see any evidence of infection locally or systemically which is great news. Objective Constitutional Well-nourished and well-hydrated in no acute distress. Vitals Time Taken: 11:08 AM, Height: 70 in, Weight: 267 lbs, BMI: 38.3, Temperature: 98.7 F, Pulse: 74 bpm, Respiratory Rate: 17 breaths/min, Blood Pressure: 150/107 mmHg. Respiratory normal breathing without difficulty. Psychiatric this patient is able to make decisions and demonstrates good insight into disease process. Alert and Oriented x 3. pleasant and cooperative. General Notes: Upon inspection patient's wound bed showed evidence of good granulation and epithelization at this point. In fact this is completely closed I assumed it probably was last week but I was not 100% sure it is definitely so this week. Integumentary (Hair, Skin) Wound #1 status is Open. Original cause of wound was Surgical Injury. The date acquired was: 04/18/2021. The wound has been in treatment 5 weeks. The wound is located on the Scrotum. The wound measures 0cm length x 0cm width x 0cm depth; 0cm^2 area and 0cm^3  volume. There is a medium amount of serosanguineous drainage noted. Assessment Active Problems ICD-10 Fournier gangrene Non-pressure chronic ulcer of skin of other sites with fat layer exposed Type  2 diabetes mellitus with other skin ulcer Essential (primary) hypertension Plan 1. I would recommend that we going discontinue wound care services and the patient is in agreement with plan. 2. Also can recommend that the patient can resume sexual activity although I would say hold for 2 additional weeks to allow the area to toughen up before then and that he should be good to go. We will see patient back for reevaluation in 1 week here in the clinic. If anything worsens or changes patient will contact our office for additional recommendations. Electronic Signature(s) Signed: 06/10/2021 11:18:32 AM By: Lenda Kelp PA-C Entered By: Lenda Kelp on 06/10/2021 11:18:32 -------------------------------------------------------------------------------- SuperBill Details Patient Name: Date of Service: Greg Quinn [facility] J. 06/10/2021 Medical Record Number: [id number] Patient Account Number: [id number] Date of Birth/Sex: Treating RN: 07/15/1970 (51 y.o. Greg Quinn, [name] Primary Care Provider: Jonah Blue Other Clinician: Referring Provider: Treating Provider/Extender: Sinclair Ship in Treatment: 5 Diagnosis Coding ICD-10 Codes Code Description N49.3 Fournier gangrene [phone] Non-pressure chronic ulcer of skin of other sites with fat layer exposed E11.622 Type 2 diabetes mellitus with other skin ulcer I10 Essential (primary) hypertension Facility Procedures CPT4 Code: [id number] Description: [phone] - WOUND CARE VISIT-LEV 2 EST PT Modifier: Quantity: 1 Physician Procedures : CPT4 Code Description Modifier [id number] - WC PHYS LEVEL 3 - EST PT ICD-10 Diagnosis Description N49.3 Fournier gangrene L98.492 Non-pressure chronic ulcer of skin of other  sites with fat layer exposed E11.622 Type 2 diabetes mellitus with other  skin ulcer I10 Essential (primary) hypertension Quantity: 1 Electronic Signature(s) Signed: 06/10/2021 4:18:49 PM By: Lenda Kelp PA-C Signed: 06/11/2021 4:40:18 PM By: Fonnie Mu RN Previous Signature: 06/10/2021 11:18:43 AM Version By: Lenda Kelp PA-C Entered By: Fonnie Mu on 06/10/2021 11:24:06

## 2021-06-11 NOTE — Progress Notes (Signed)
MARCIS, PETTEYS ([id number]) Visit Report for 06/10/2021 Arrival Information Details Patient Name: Date of Service: Greg Quinn [facility] HN J. 06/10/2021 10:45 A M Medical Record Number: [id number] Patient Account Number: [id number] Date of Birth/Sex: Treating RN: 1970/03/03 (51 y.o. [name], [name] Primary Care Jaylean Buenaventura: Karle Plumber Other Clinician: Referring Trinadee Verhagen: Treating Denyse Fillion/Extender: Randon Goldsmith in Treatment: 5 Visit Information History Since Last Visit Added or deleted any medications: No Patient Arrived: Ambulatory Any new allergies or adverse reactions: No Arrival Time: 11:08 Had a fall or experienced change in No Accompanied By: self activities of daily living that may affect Transfer Assistance: None risk of falls: Patient Identification Verified: Yes Signs or symptoms of abuse/neglect since last visito No Secondary Verification Process Completed: Yes Hospitalized since last visit: No Patient Has Alerts: Yes Implantable device outside of the clinic excluding No Patient Alerts: Patient on Blood Thinner cellular tissue based products placed in the center since last visit: Has Dressing in Place as Prescribed: Yes Pain Present Now: No Electronic Signature(s) Signed: 06/11/2021 4:40:18 PM By: Rhae Hammock RN Entered By: Rhae Hammock on 06/10/2021 11:08:30 -------------------------------------------------------------------------------- Clinic Level of Care Assessment Details Patient Name: Date of Service: Greg Quinn [facility] J. 06/10/2021 10:45 A M Medical Record Number: [id number] Patient Account Number: [id number] Date of Birth/Sex: Treating RN: 1970/09/02 (51 y.o. [name], [name] Primary Care Yaslene Lindamood: Karle Plumber Other Clinician: Referring Khamarion Bjelland: Treating Hayven Croy/Extender: Randon Goldsmith in Treatment: 5 Clinic Level of Care Assessment Items TOOL 4 Quantity  Score X- 1 0 Use when only an EandM is performed on FOLLOW-UP visit ASSESSMENTS - Nursing Assessment / Reassessment X- 1 10 Reassessment of Co-morbidities (includes updates in patient status) X- 1 5 Reassessment of Adherence to Treatment Plan ASSESSMENTS - Wound and Skin A ssessment / Reassessment X - Simple Wound Assessment / Reassessment - one wound 1 5 []  - 0 Complex Wound Assessment / Reassessment - multiple wounds []  - 0 Dermatologic / Skin Assessment (not related to wound area) ASSESSMENTS - Focused Assessment []  - 0 Circumferential Edema Measurements - multi extremities []  - 0 Nutritional Assessment / Counseling / Intervention []  - 0 Lower Extremity Assessment (monofilament, tuning fork, pulses) []  - 0 Peripheral Arterial Disease Assessment (using hand held doppler) ASSESSMENTS - Ostomy and/or Continence Assessment and Care []  - 0 Incontinence Assessment and Management []  - 0 Ostomy Care Assessment and Management (repouching, etc.) PROCESS - Coordination of Care X - Simple Patient / Family Education for ongoing care 1 15 []  - 0 Complex (extensive) Patient / Family Education for ongoing care X- 1 10 Staff obtains Programmer, systems, Records, T Results / Process Orders est []  - 0 Staff telephones HHA, Nursing Homes / Clarify orders / etc []  - 0 Routine Transfer to another Facility (non-emergent condition) []  - 0 Routine Hospital Admission (non-emergent condition) []  - 0 New Admissions / Biomedical engineer / Ordering NPWT Apligraf, etc. , []  - 0 Emergency Hospital Admission (emergent condition) X- 1 10 Simple Discharge Coordination []  - 0 Complex (extensive) Discharge Coordination PROCESS - Special Needs []  - 0 Pediatric / Minor Patient Management []  - 0 Isolation Patient Management []  - 0 Hearing / Language / Visual special needs []  - 0 Assessment of Community assistance (transportation, D/C planning, etc.) []  - 0 Additional assistance / Altered  mentation []  - 0 Support Surface(s) Assessment (bed, cushion, seat, etc.) INTERVENTIONS - Wound Cleansing / Measurement X - Simple Wound Cleansing - one  wound 1 5 []  - 0 Complex Wound Cleansing - multiple wounds X- 1 5 Wound Imaging (photographs - any number of wounds) []  - 0 Wound Tracing (instead of photographs) X- 1 5 Simple Wound Measurement - one wound []  - 0 Complex Wound Measurement - multiple wounds INTERVENTIONS - Wound Dressings []  - 0 Small Wound Dressing one or multiple wounds []  - 0 Medium Wound Dressing one or multiple wounds []  - 0 Large Wound Dressing one or multiple wounds []  - 0 Application of Medications - topical []  - 0 Application of Medications - injection INTERVENTIONS - Miscellaneous []  - 0 External ear exam []  - 0 Specimen Collection (cultures, biopsies, blood, body fluids, etc.) []  - 0 Specimen(s) / Culture(s) sent or taken to Lab for analysis []  - 0 Patient Transfer (multiple staff / Civil Service fast streamer / Similar devices) []  - 0 Simple Staple / Suture removal (25 or less) []  - 0 Complex Staple / Suture removal (26 or more) []  - 0 Hypo / Hyperglycemic Management (close monitor of Blood Glucose) []  - 0 Ankle / Brachial Index (ABI) - do not check if billed separately X- 1 5 Vital Signs Has the patient been seen at the hospital within the last three years: Yes Total Score: 75 Level Of Care: New/Established - Level 2 Electronic Signature(s) Signed: 06/11/2021 4:40:18 PM By: Rhae Hammock RN Entered By: Rhae Hammock on 06/10/2021 11:23:33 -------------------------------------------------------------------------------- Encounter Discharge Information Details Patient Name: Date of Service: Greg Quinn [facility] J. 06/10/2021 10:45 A M Medical Record Number: [id number] Patient Account Number: [id number] Date of Birth/Sex: Treating RN: 13-Nov-1970 (51 y.o. [name], [name] Primary Care Ollie Delano: Karle Plumber Other Clinician: Referring  Leone Putman: Treating Chianti Goh/Extender: Randon Goldsmith in Treatment: 5 Encounter Discharge Information Items Discharge Condition: Stable Ambulatory Status: Ambulatory Discharge Destination: Home Transportation: Private Auto Accompanied By: self Schedule Follow-up Appointment: Yes Clinical Summary of Care: Patient Declined Electronic Signature(s) Signed: 06/11/2021 4:40:18 PM By: Rhae Hammock RN Entered By: Rhae Hammock on 06/10/2021 11:28:10 -------------------------------------------------------------------------------- Lower Extremity Assessment Details Patient Name: Date of Service: Greg Quinn [facility] J. 06/10/2021 10:45 A M Medical Record Number: [id number] Patient Account Number: [id number] Date of Birth/Sex: Treating RN: 07-Aug-1970 (51 y.o. Erie Noe Primary Care Kaynan Klonowski: Karle Plumber Other Clinician: Referring Nichlos Kunzler: Treating Brookelynn Hamor/Extender: Randon Goldsmith in Treatment: 5 Electronic Signature(s) Signed: 06/11/2021 4:40:18 PM By: Rhae Hammock RN Entered By: Rhae Hammock on 06/10/2021 11:10:04 -------------------------------------------------------------------------------- Multi-Disciplinary Care Plan Details Patient Name: Date of Service: Greg Quinn [facility] J. 06/10/2021 10:45 A M Medical Record Number: [id number] Patient Account Number: [id number] Date of Birth/Sex: Treating RN: [birth year] (51 y.o. Erie Noe Primary Care Dena Esperanza: Karle Plumber Other Clinician: Referring Inge Waldroup: Treating Rachella Basden/Extender: Randon Goldsmith in Treatment: 5 Active Inactive Electronic Signature(s) Signed: 06/11/2021 4:40:18 PM By: Rhae Hammock RN Entered By: Rhae Hammock on 06/10/2021 11:22:41 -------------------------------------------------------------------------------- Pain Assessment Details Patient Name: Date of Service: Greg Quinn [facility] J. 06/10/2021 10:45 A M Medical Record Number: [id number] Patient Account Number: [id number] Date of Birth/Sex: Treating RN: 1971/01/24 (51 y.o. Erie Noe Primary Care Samyra Limb: Karle Plumber Other Clinician: Referring Darshawn Boateng: Treating Coe Angelos/Extender: Randon Goldsmith in Treatment: 5 Active Problems Location of Pain Severity and Description of Pain Patient Has Paino No Site Locations Pain Management and Medication Current Pain Management: Electronic Signature(s) Signed: 06/11/2021 4:40:18 PM By: Rhae Hammock RN  Entered By: Rhae Hammock on 06/10/2021 11:09:57 -------------------------------------------------------------------------------- Patient/Caregiver Education Details Patient Name: Date of Service: Moreen Fowler HN J. 5/17/2023andnbsp10:45 Micro Record Number: [id number] Patient Account Number: [id number] Date of Birth/Gender: Treating RN: 12/21/1970 (51 y.o. Erie Noe Primary Care Physician: Karle Plumber Other Clinician: Referring Physician: Treating Physician/Extender: Randon Goldsmith in Treatment: 5 Education Assessment Education Provided To: Patient Education Topics Provided Wound/Skin Impairment: Methods: Explain/Verbal Responses: Reinforcements needed, State content correctly Electronic Signature(s) Signed: 06/11/2021 4:40:18 PM By: Rhae Hammock RN Entered By: Rhae Hammock on 06/10/2021 11:10:53 -------------------------------------------------------------------------------- Wound Assessment Details Patient Name: Date of Service: Greg Quinn [facility] J. 06/10/2021 10:45 A M Medical Record Number: [id number] Patient Account Number: [id number] Date of Birth/Sex: Treating RN: 29-Dec-1970 (51 y.o. [name], [name] Primary Care Ashon Rosenberg: Karle Plumber Other Clinician: Referring Dondrea Clendenin: Treating Korena Nass/Extender: Randon Goldsmith in Treatment: 5 Wound Status Wound Number: 1 Primary Open Surgical Wound Etiology: Wound Location: Scrotum Wound Status: Open Wounding Event: Surgical Injury Comorbid Coronary Artery Disease, Deep Vein Thrombosis, Date Acquired: 04/18/2021 History: Hypertension Weeks Of Treatment: 5 Clustered Wound: No Photos Wound Measurements Length: (cm) Width: (cm) Depth: (cm) Area: (cm) Volume: (cm) 0 % Reduction in Area: 100% 0 % Reduction in Volume: 100% 0 Epithelialization: Medium (34-66%) 0 0 Wound Description Classification: Full Thickness With Exposed Support Structures Wound Margin: Distinct, outline attached Exudate Amount: Medium Exudate Type: Serosanguineous Exudate Color: red, brown Wound Bed Granulation Amount: Large (67-100%) Granulation Quality: Red, Pink Necrotic Amount: None Present (0%) Foul Odor After Cleansing: No Slough/Fibrino No Exposed Structure Fascia Exposed: No Fat Layer (Subcutaneous Tissue) Exposed: Yes Tendon Exposed: No Muscle Exposed: No Joint Exposed: No Bone Exposed: No Electronic Signature(s) Signed: 06/11/2021 4:40:18 PM By: Rhae Hammock RN Entered By: Rhae Hammock on 06/10/2021 11:19:02 -------------------------------------------------------------------------------- Vitals Details Patient Name: Date of Service: Greg Quinn [facility] J. 06/10/2021 10:45 A M Medical Record Number: [id number] Patient Account Number: [id number] Date of Birth/Sex: Treating RN: 1970-04-19 (51 y.o. [name], [name] Primary Care Anniston Nellums: Karle Plumber Other Clinician: Referring Jasha Hodzic: Treating Andreika Vandagriff/Extender: Randon Goldsmith in Treatment: 5 Vital Signs Time Taken: 11:08 Temperature (F): 98.7 Height (in): 70 Pulse (bpm): 74 Weight (lbs): 267 Respiratory Rate (breaths/min): 17 Body Mass Index (BMI): 38.3 Blood Pressure (mmHg): 150/107 Reference Range: 80 - 120 mg /  dl Electronic Signature(s) Signed: 06/11/2021 4:40:18 PM By: Rhae Hammock RN Entered By: Rhae Hammock on 06/10/2021 11:09:42

## 2021-06-17 NOTE — Progress Notes (Unsigned)
Office Visit    Patient Name: Greg Quinn Date of Encounter: 06/18/2021  Primary Care Provider:  Ladell Pier, MD Primary Cardiologist:  Sanda Klein, MD  Chief Complaint    51 year old male with a history of CAD s/p NSTEMI, s/p DES-mLCx in 2022, PE/DVT s/p IVC filter, HIT, hypertension, hyperlipidemia, type 2 diabetes, Fournier's gangrene, obesity, and tobacco use who presents for follow-up related to CAD.  Past Medical History    Past Medical History:  Diagnosis Date   Bilateral pulmonary embolism ([place])    2009, on xarelto, IVC filter   Coronary artery disease    Current every day smoker    25 pack year history   DVT, lower extremity ([place])    2009   Hyperlipidemia LDL goal <70    Hypertension    NSTEMI (non-ST elevated myocardial infarction) ([place]) 08/20/2020   NSTEMI (non-ST elevated myocardial infarction) ([place]) 08/20/2020   Uncontrolled diabetes mellitus    Past Surgical History:  Procedure Laterality Date   CORONARY STENT INTERVENTION N/A 08/20/2020   Procedure: CORONARY STENT INTERVENTION;  Surgeon: Jettie Booze, MD;  Location: [facility];  Service: Cardiovascular;  Laterality: N/A;   CYSTOSCOPY N/A 04/23/2021   Procedure: CYSTOSCOPY;  Surgeon: Bjorn Loser, MD;  Location: [facility];  Service: Urology;  Laterality: N/A;   INCISION AND DRAINAGE ABSCESS N/A 04/23/2021   Procedure: INCISION AND DRAINAGE ABSCESS;  Surgeon: Bjorn Loser, MD;  Location: [facility];  Service: Urology;  Laterality: N/A;   INTRAVASCULAR ULTRASOUND/IVUS N/A 08/20/2020   Procedure: Intravascular Ultrasound/IVUS;  Surgeon: Jettie Booze, MD;  Location: [facility];  Service: Cardiovascular;  Laterality: N/A;   LEFT HEART CATH AND CORONARY ANGIOGRAPHY N/A 08/20/2020   Procedure: LEFT HEART CATH AND CORONARY ANGIOGRAPHY;  Surgeon: Jettie Booze, MD;  Location: [facility];  Service: Cardiovascular;  Laterality: N/A;     Allergies  Allergies  Allergen Reactions   Heparin Other (See Comments)   Pork-Derived Products     History of Present Illness    51 year old male with the above past medical history including CAD s/p NSTEMI, s/p DES-mLCx in 2022, PE/DVT s/p IVC filter, HIT, hypertension, hyperlipidemia, type 2 diabetes, Fournier's gangrene, obesity, and tobacco use.  He has a history of extensive DVT in his left lower extremity, right lower extremity, and bilateral PE s/p IVC filter, on Xarelto. Stress test in 2017 in [place] was reportedly normal.  He was hospitalized in July 2022 in the setting of NSTEMI. Cardiac catheterization revealed mLCx 90-0% s/p DES, dRCA 40%, and p-mLAD 25% stenosis, EF 55-65%, mildly elevated LVEDP. Echocardiogram at the time showed EF 60 to 65%, no RWMA, mild LVH, no significant valvular abnormalities.  He has not been seen in follow-up since.  He was hospitalized in March/April 2023 in the setting of scrotal infection with Fournier's gangrene. ID and urology were consulted and he was treated with antibiotics, s/p I&D of scrotum and debridement of necrotic tissue with irrigation Foley catheter.    He presents today for follow-up. Since his hospitalization and since his last visit he has done well from a cardiac standpoint.  Additionally, he is recovering well from his hospitalization earlier this year.  He is walking regularly and is tolerating this well.  He denies dyspnea, denies symptoms concerning for angina.  Overall, he reports feeling well denies any new concerns today.   Home Medications    Current Outpatient Medications  Medication Sig Dispense Refill   Accu-Chek  Softclix Lancets lancets Use up to 4 times daily as directed 100 each 0   albuterol (VENTOLIN HFA) 108 (90 Base) MCG/ACT inhaler Inhale 1-2 puffs into the lungs 4 (four) times daily as needed for wheezing or shortness of breath. 6.7 g 0   Ascorbic Acid (VITAMIN C) 1000 MG tablet Take 1,000 mg by mouth 2  (two) times daily.     atorvastatin (LIPITOR) 80 MG tablet Take 1 tablet (80 mg total) by mouth daily. 90 tablet 4   Blood Glucose Monitoring Suppl (BLOOD GLUCOSE MONITOR SYSTEM) w/Device KIT Use as directed 1 kit 0   clopidogrel (PLAVIX) 75 MG tablet Take 1 tablet (75 mg total) by mouth daily. 30 tablet 6   diclofenac Sodium (VOLTAREN) 1 % GEL Apply 4 g topically 4 (four) times daily. (Patient taking differently: Apply 4 g topically 3 (three) times daily.) 100 g 0   glucose blood test strip Use up to 4 times daily 100 each 0   HYDROcodone-acetaminophen (NORCO/VICODIN) 5-325 MG tablet Take 1 tablet by mouth 2 (two) times daily as needed for moderate pain. 20 tablet 0   hydrocortisone cream 1 % Apply topically 2 (two) times daily. 28 g 0   insulin detemir (LEVEMIR) 100 UNIT/ML FlexPen Inject 35 Units into the skin daily. 15 mL 5   Insulin Pen Needle 32G X 4 MM MISC use as directed daily with insulin 100 each 0   lisinopril (ZESTRIL) 20 MG tablet Take 1 tablet (20 mg total) by mouth daily. 90 tablet 1   metFORMIN (GLUCOPHAGE-XR) 750 MG 24 hr tablet Take 1 tablet (750 mg total) by mouth 2 (two) times daily. 180 tablet 3   metoprolol succinate (TOPROL-XL) 25 MG 24 hr tablet Take 1 tablet (25 mg total) by mouth daily. 90 tablet 3   nitroGLYCERIN (NITROSTAT) 0.4 MG SL tablet Place 1 tablet (0.4 mg total) under the tongue every 5 (five) minutes as needed for chest pain. 20 tablet 1   XARELTO 20 MG TABS tablet Take 1 tablet (20 mg total) by mouth daily. 30 tablet 4   No current facility-administered medications for this visit.     Review of Systems    He denies chest pain, palpitations, dyspnea, pnd, orthopnea, n, v, dizziness, syncope, edema, weight gain, or early satiety. All other systems reviewed and are otherwise negative except as noted above.   Physical Exam    VS:  BP 130/88   Pulse 91   Ht 5' 10.5" (1.791 m)   Wt 263 lb (119.3 kg)   SpO2 98%   BMI 37.20 kg/m   GEN: Well nourished,  well developed, in no acute distress. HEENT: normal. Neck: Supple, no JVD, carotid bruits, or masses. Cardiac: RRR, no murmurs, rubs, or gallops. No clubbing, cyanosis, edema.  Radials/DP/PT 2+ and equal bilaterally.  Respiratory:  Respirations regular and unlabored, clear to auscultation bilaterally. GI: Soft, nontender, nondistended, BS + x 4. MS: no deformity or atrophy. Skin: warm and dry, no rash. Neuro:  Strength and sensation are intact. Psych: Normal affect.  Accessory Clinical Findings    ECG personally reviewed by me today - No EKG in office today.  EKG reviewed from hospitalization in March 2023, which showed sinus rhythm, 93 bpm, nonspecific ST/T wave changes.  No acute changes.  Lab Results  Component Value Date   WBC 9.3 05/01/2021   HGB 13.5 05/01/2021   HCT 39.4 05/01/2021   MCV 87.0 05/01/2021   PLT 278 05/01/2021   Lab Results  Component Value Date   CREATININE 0.81 05/01/2021   BUN 9 05/01/2021   NA 136 05/01/2021   K 4.1 05/01/2021   CL 103 05/01/2021   CO2 28 05/01/2021   Lab Results  Component Value Date   ALT 35 04/30/2021   AST 23 04/30/2021   ALKPHOS 103 04/30/2021   BILITOT 0.2 (L) 04/30/2021   Lab Results  Component Value Date   CHOL 146 08/21/2020   HDL 42 08/21/2020   LDLCALC 93 08/21/2020   TRIG 57 08/21/2020   CHOLHDL 3.5 08/21/2020    Lab Results  Component Value Date   HGBA1C 10.7 (H) 04/23/2021    Assessment & Plan    1. CAD: Cath in 2022 showed mLCx 90-0% s/p DES, dRCA 40%, and p-mLAD 25% stenosis, EF 55-65%, mildly elevated LVEDP. Echo showed EF 60 to 65%, no RWMA, mild LVH, no significant valvular abnormalities. Stable with no anginal symptoms. No indication for ischemic evaluation.  Not on ASA in the setting of chronic DOAC therapy.  I will reach out to Dr. Sallyanne Kuster to see if he recommends low-dose aspirin once Plavix is discontinued. Continue Plavix through July 2023, continue lisinopril, metoprolol, Lipitor.  Addendum  06/18/21: Per Dr. Sallyanne Kuster, no indication for addition of aspirin therapy once Plavix discontinued.  Okay to continue Xarelto alone.  2. Hypertension: BP today above goal in office today, however, he has not taken his medication yet this morning.  Overall well controlled at home.  He will continue to monitor BP and report BP consistently> 130/80.  For now, continue current antihypertensive regimen.   3. Hyperlipidemia: LDL was 93 in 07/2020.  He is not fasting today.  He will return for repeat lipids, LFTs.  Continue Lipitor.  4. H/o PE/DVT: On Xarelto. Managed per PCP.   5. Type 2 diabetes: A1C was 10.7 in 03/2021. Monitored and managed per PCP.   6. Fournier's gangrene: Following with ID and urology.   7. Disposition: Follow-up in 1 year.  Lenna Sciara, NP 06/18/2021, 8:30 AM

## 2021-06-18 ENCOUNTER — Encounter: Payer: Self-pay | Admitting: Nurse Practitioner

## 2021-06-18 ENCOUNTER — Ambulatory Visit (INDEPENDENT_AMBULATORY_CARE_PROVIDER_SITE_OTHER): Payer: BC Managed Care – PPO | Admitting: Nurse Practitioner

## 2021-06-18 VITALS — BP 130/88 | HR 91 | Ht 70.5 in | Wt 263.0 lb

## 2021-06-18 DIAGNOSIS — I10 Essential (primary) hypertension: Secondary | ICD-10-CM | POA: Diagnosis not present

## 2021-06-18 DIAGNOSIS — E785 Hyperlipidemia, unspecified: Secondary | ICD-10-CM | POA: Diagnosis not present

## 2021-06-18 DIAGNOSIS — Z87438 Personal history of other diseases of male genital organs: Secondary | ICD-10-CM

## 2021-06-18 DIAGNOSIS — E118 Type 2 diabetes mellitus with unspecified complications: Secondary | ICD-10-CM

## 2021-06-18 DIAGNOSIS — I2510 Atherosclerotic heart disease of native coronary artery without angina pectoris: Secondary | ICD-10-CM

## 2021-06-18 DIAGNOSIS — Z86718 Personal history of other venous thrombosis and embolism: Secondary | ICD-10-CM

## 2021-06-18 DIAGNOSIS — Z86711 Personal history of pulmonary embolism: Secondary | ICD-10-CM

## 2021-06-18 DIAGNOSIS — Z79899 Other long term (current) drug therapy: Secondary | ICD-10-CM

## 2021-06-18 NOTE — Patient Instructions (Signed)
Medication Instructions:  Your physician recommends that you continue on your current medications as directed. Please refer to the Current Medication list given to you today.   *If you need a refill on your cardiac medications before your next appointment, please call your pharmacy*   Lab Work: Your physician recommends that you return for lab work in 1-2 weeks Lipid panel(fasting) LFTs  If you have labs (blood work) drawn today and your tests are completely normal, you will receive your results only by: MyChart Message (if you have MyChart) OR A paper copy in the mail If you have any lab test that is abnormal or we need to change your treatment, we will call you to review the results.   Testing/Procedures: NONE ordered at this time of appointment     Follow-Up: At [facility], you and your health needs are our priority.  As part of our continuing mission to provide you with exceptional heart care, we have created designated Provider Care Teams.  These Care Teams include your primary Cardiologist (physician) and Advanced Practice Providers (APPs -  Physician Assistants and Nurse Practitioners) who all work together to provide you with the care you need, when you need it.  We recommend signing up for the patient portal called "MyChart".  Sign up information is provided on this After Visit Summary.  MyChart is used to connect with patients for Virtual Visits (Telemedicine).  Patients are able to view lab/test results, encounter notes, upcoming appointments, etc.  Non-urgent messages can be sent to your provider as well.   To learn more about what you can do with MyChart, go to [email or web address].    Your next appointment:   1 year(s)  The format for your next appointment:   In Person  Provider:   Thurmon Fair, MD     Other Instructions   Important Information About Sugar

## 2021-07-03 ENCOUNTER — Ambulatory Visit: Payer: BC Managed Care – PPO | Admitting: Pharmacist

## 2021-07-07 NOTE — Progress Notes (Signed)
Greg Quinn, Greg Quinn ([id number]) Visit Report for 06/03/2021 Arrival Information Details Patient Name: Date of Service: Greg Quinn [facility] HN J. 06/03/2021 10:45 A M Medical Record Number: [id number] Patient Account Number: [id number] Date of Birth/Sex: Treating RN: 12-29-1970 (51 y.o. [name], [name] Primary Care Santino Kinsella: Jonah Blue Other Clinician: Referring Shanessa Hodak: Treating Cortny Bambach/Extender: Sinclair Ship in Treatment: 4 Visit Information History Since Last Visit Added or deleted any medications: No Patient Arrived: Ambulatory Any new allergies or adverse reactions: No Arrival Time: 11:06 Had a fall or experienced change in No Accompanied By: self activities of daily living that may affect Transfer Assistance: None risk of falls: Patient Has Alerts: Yes Signs or symptoms of abuse/neglect since last visito No Patient Alerts: Patient on Blood Thinner Hospitalized since last visit: No Implantable device outside of the clinic excluding No cellular tissue based products placed in the center since last visit: Pain Present Now: Yes Electronic Signature(s) Signed: 07/07/2021 8:46:54 AM By: Thayer Dallas Entered By: Thayer Dallas on 06/03/2021 11:07:14 -------------------------------------------------------------------------------- Clinic Level of Care Assessment Details Patient Name: Date of Service: Greg Quinn [facility] HN J. 06/03/2021 10:45 A M Medical Record Number: [id number] Patient Account Number: [id number] Date of Birth/Sex: Treating RN: [birth year] (51 y.o. [name], [name] Primary Care Iantha Titsworth: Jonah Blue Other Clinician: Referring Dinorah Masullo: Treating Jasim Harari/Extender: Sinclair Ship in Treatment: 4 Clinic Level of Care Assessment Items TOOL 4 Quantity Score X- 1 0 Use when only an EandM is performed on FOLLOW-UP visit ASSESSMENTS - Nursing Assessment / Reassessment X- 1  10 Reassessment of Co-morbidities (includes updates in patient status) X- 1 5 Reassessment of Adherence to Treatment Plan ASSESSMENTS - Wound and Skin A ssessment / Reassessment X - Simple Wound Assessment / Reassessment - one wound 1 5 []  - 0 Complex Wound Assessment / Reassessment - multiple wounds []  - 0 Dermatologic / Skin Assessment (not related to wound area) ASSESSMENTS - Focused Assessment []  - 0 Circumferential Edema Measurements - multi extremities []  - 0 Nutritional Assessment / Counseling / Intervention []  - 0 Lower Extremity Assessment (monofilament, tuning fork, pulses) []  - 0 Peripheral Arterial Disease Assessment (using hand held doppler) ASSESSMENTS - Ostomy and/or Continence Assessment and Care []  - 0 Incontinence Assessment and Management []  - 0 Ostomy Care Assessment and Management (repouching, etc.) PROCESS - Coordination of Care X - Simple Patient / Family Education for ongoing care 1 15 []  - 0 Complex (extensive) Patient / Family Education for ongoing care X- 1 10 Staff obtains , Records, T Results / Process Orders est []  - 0 Staff telephones HHA, Nursing Homes / Clarify orders / etc []  - 0 Routine Transfer to another Facility (non-emergent condition) []  - 0 Routine Hospital Admission (non-emergent condition) []  - 0 New Admissions / / Ordering NPWT Apligraf, etc. , []  - 0 Emergency Hospital Admission (emergent condition) X- 1 10 Simple Discharge Coordination []  - 0 Complex (extensive) Discharge Coordination PROCESS - Special Needs []  - 0 Pediatric / Minor Patient Management []  - 0 Isolation Patient Management []  - 0 Hearing / Language / Visual special needs []  - 0 Assessment of Community assistance (transportation, D/C planning, etc.) []  - 0 Additional assistance / Altered mentation []  - 0 Support Surface(s) Assessment (bed, cushion, seat, etc.) INTERVENTIONS - Wound Cleansing / Measurement X - Simple  Wound Cleansing - one wound 1 5 []  - 0 Complex Wound Cleansing - multiple wounds X- 1 5 Wound Imaging (  photographs - any number of wounds) []  - 0 Wound Tracing (instead of photographs) X- 1 5 Simple Wound Measurement - one wound []  - 0 Complex Wound Measurement - multiple wounds INTERVENTIONS - Wound Dressings X - Small Wound Dressing one or multiple wounds 1 10 []  - 0 Medium Wound Dressing one or multiple wounds []  - 0 Large Wound Dressing one or multiple wounds X- 1 5 Application of Medications - topical []  - 0 Application of Medications - injection INTERVENTIONS - Miscellaneous []  - 0 External ear exam []  - 0 Specimen Collection (cultures, biopsies, blood, body fluids, etc.) []  - 0 Specimen(s) / Culture(s) sent or taken to Lab for analysis []  - 0 Patient Transfer (multiple staff / Nurse, adultHoyer Lift / Similar devices) []  - 0 Simple Staple / Suture removal (25 or less) []  - 0 Complex Staple / Suture removal (26 or more) []  - 0 Hypo / Hyperglycemic Management (close monitor of Blood Glucose) []  - 0 Ankle / Brachial Index (ABI) - do not check if billed separately X- 1 5 Vital Signs Has the patient been seen at the hospital within the last three years: Yes Total Score: 90 Level Of Care: New/Established - Level 3 Electronic Signature(s) Signed: 06/03/2021 5:07:52 PM By: Fonnie MuBreedlove, [name] RN Entered By: Fonnie MuBreedlove, [name] on 06/03/2021 11:54:50 -------------------------------------------------------------------------------- Encounter Discharge Information Details Patient Name: Date of Service: Greg Quinn, Greg [facility] HN J. 06/03/2021 10:45 A M Medical Record Number: [id number] Patient Account Number: [id number] Date of Birth/Sex: Treating RN: August 27, 1970 ([age] y.o. Charlean MerlM) Breedlove, [name] Primary Care Nalin Mazzocco: Jonah BlueJohnson, Deborah Other Clinician: Referring Trayce Maino: Treating Jelan Batterton/Extender: Sinclair ShipStone III, Hoyt Johnson, Deborah Weeks in Treatment: 4 Encounter Discharge Information  Items Discharge Condition: Stable Ambulatory Status: Ambulatory Discharge Destination: Home Transportation: Private Auto Accompanied By: self Schedule Follow-up Appointment: Yes Clinical Summary of Care: Patient Declined Electronic Signature(s) Signed: 06/03/2021 5:07:52 PM By: Fonnie MuBreedlove, [name] RN Entered By: Fonnie MuBreedlove, [name] on 06/03/2021 12:23:17 -------------------------------------------------------------------------------- Lower Extremity Assessment Details Patient Name: Date of Service: Greg Quinn, Greg [facility] HN J. 06/03/2021 10:45 A M Medical Record Number: [id number] Patient Account Number: [id number] Date of Birth/Sex: Treating RN: August 27, 1970 (51 y.o. Lucious GrovesM) Breedlove, [name] Primary Care Torrey Ballinas: Jonah BlueJohnson, Deborah Other Clinician: Referring Shaquala Broeker: Treating Yashira Offenberger/Extender: Sinclair ShipStone III, Hoyt Johnson, Deborah Weeks in Treatment: 4 Electronic Signature(s) Signed: 06/03/2021 5:07:52 PM By: Fonnie MuBreedlove, [name] RN Signed: 07/07/2021 8:46:54 AM By: Thayer Dallasick, Kimberly Entered By: Thayer Dallasick, Kimberly on 06/03/2021 11:08:56 -------------------------------------------------------------------------------- Multi-Disciplinary Care Plan Details Patient Name: Date of Service: Greg Quinn, Greg [facility] HN J. 06/03/2021 10:45 A M Medical Record Number: [id number] Patient Account Number: [id number] Date of Birth/Sex: Treating RN: August 27, 1970 (51 y.o. Charlean MerlM) Breedlove, [name] Primary Care Amun Stemm: Jonah BlueJohnson, Deborah Other Clinician: Referring Zakry Caso: Treating Tayanna Talford/Extender: Sinclair ShipStone III, Hoyt Johnson, Deborah Weeks in Treatment: 4 Active Inactive Wound/Skin Impairment Nursing Diagnoses: Impaired tissue integrity Knowledge deficit related to ulceration/compromised skin integrity Goals: Patient will have a decrease in wound volume by X% from date: (specify in notes) Date Initiated: 05/06/2021 Target Resolution Date: 06/27/2021 Goal Status: Active Patient/caregiver will verbalize understanding of skin care  regimen Date Initiated: 05/06/2021 Target Resolution Date: 06/27/2021 Goal Status: Active Ulcer/skin breakdown will have a volume reduction of 30% by week 4 Date Initiated: 05/06/2021 Target Resolution Date: 06/27/2021 Goal Status: Active Interventions: Assess patient/caregiver ability to obtain necessary supplies Assess patient/caregiver ability to perform ulcer/skin care regimen upon admission and as needed Assess ulceration(s) every visit Notes: Electronic Signature(s) Signed: 06/03/2021 5:07:52 PM By: Fonnie MuBreedlove, [name] RN Entered By: Bryon LionsBreedlove,  [name] on 06/03/2021 11:53:57 -------------------------------------------------------------------------------- Pain Assessment Details Patient Name: Date of Service: Greg Quinn [facility] HN J. 06/03/2021 10:45 A M Medical Record Number: [id number] Patient Account Number: [id number] Date of Birth/Sex: Treating RN: Feb 22, 1970 (51 y.o. Lucious Groves Primary Care Kahlyn Shippey: Jonah Blue Other Clinician: Referring Ilisa Hayworth: Treating Siria Calandro/Extender: Sinclair Ship in Treatment: 4 Active Problems Location of Pain Severity and Description of Pain Patient Has Paino Yes Site Locations Pain Location: Pain Location: Pain in Ulcers Duration of the Pain. Constant / Intermittento Intermittent Rate the pain. Current Pain Level: 4 Character of Pain Describe the Pain: Other: pulling Pain Management and Medication Current Pain Management: Electronic Signature(s) Signed: 06/03/2021 5:07:52 PM By: Fonnie Mu RN Signed: 07/07/2021 8:46:54 AM By: Thayer Dallas Entered By: Thayer Dallas on 06/03/2021 11:08:49 -------------------------------------------------------------------------------- Patient/Caregiver Education Details Patient Name: Date of Service: Lovie Macadamia HN J. 5/10/2023andnbsp10:45 A M Medical Record Number: [id number] Patient Account Number: [id number] Date of  Birth/Gender: Treating RN: 1970/12/12 (51 y.o. Lucious Groves Primary Care Physician: Jonah Blue Other Clinician: Referring Physician: Treating Physician/Extender: Sinclair Ship in Treatment: 4 Education Assessment Education Provided To: Patient Education Topics Provided Basic Hygiene: Methods: Explain/Verbal Responses: Reinforcements needed, State content correctly Electronic Signature(s) Signed: 06/03/2021 5:07:52 PM By: Fonnie Mu RN Entered By: Fonnie Mu on 06/03/2021 11:54:10 -------------------------------------------------------------------------------- Wound Assessment Details Patient Name: Date of Service: Greg Quinn [facility] HN J. 06/03/2021 10:45 A M Medical Record Number: [id number] Patient Account Number: [id number] Date of Birth/Sex: Treating RN: Jan 08, 1971 (51 y.o. [name], [name] Primary Care Latifa Noble: Jonah Blue Other Clinician: Referring Kinzee Happel: Treating Necia Kamm/Extender: Sinclair Ship in Treatment: 4 Wound Status Wound Number: 1 Primary Open Surgical Wound Etiology: Wound Location: Scrotum Wound Status: Open Wounding Event: Surgical Injury Comorbid Coronary Artery Disease, Deep Vein Thrombosis, Date Acquired: 04/18/2021 History: Hypertension Weeks Of Treatment: 4 Clustered Wound: No Photos Wound Measurements Length: (cm) 2 Width: (cm) 1.5 Depth: (cm) 0.2 Area: (cm) 2.356 Volume: (cm) 0.471 % Reduction in Area: 82.4% % Reduction in Volume: 98.4% Epithelialization: Medium (34-66%) Tunneling: No Undermining: No Wound Description Classification: Full Thickness With Exposed Support Structures Wound Margin: Distinct, outline attached Exudate Amount: Medium Exudate Type: Serosanguineous Exudate Color: red, brown Foul Odor After Cleansing: No Slough/Fibrino No Wound Bed Granulation Amount: Large (67-100%) Exposed Structure Granulation Quality: Red,  Pink Fascia Exposed: No Necrotic Amount: None Present (0%) Fat Layer (Subcutaneous Tissue) Exposed: Yes Tendon Exposed: No Muscle Exposed: No Joint Exposed: No Bone Exposed: No Electronic Signature(s) Signed: 06/03/2021 5:07:52 PM By: Fonnie Mu RN Signed: 07/07/2021 8:46:54 AM By: Thayer Dallas Entered By: Thayer Dallas on 06/03/2021 11:20:21 -------------------------------------------------------------------------------- Vitals Details Patient Name: Date of Service: Greg Quinn [facility] HN J. 06/03/2021 10:45 A M Medical Record Number: [id number] Patient Account Number: [id number] Date of Birth/Sex: Treating RN: August 20, 1970 (51 y.o. [name], [name] Primary Care Evony Rezek: Jonah Blue Other Clinician: Referring Mayley Lish: Treating Davonta Stroot/Extender: Sinclair Ship in Treatment: 4 Vital Signs Time Taken: 11:07 Temperature (F): 98.1 Height (in): 70 Pulse (bpm): 85 Weight (lbs): 267 Respiratory Rate (breaths/min): 16 Body Mass Index (BMI): 38.3 Blood Pressure (mmHg): 148/97 Reference Range: 80 - 120 mg / dl Electronic Signature(s) Signed: 07/07/2021 8:46:54 AM By: Thayer Dallas Entered By: Thayer Dallas on 06/03/2021 11:07:38

## 2021-07-07 NOTE — Progress Notes (Signed)
STEPHONE, GUM ([id number]) Visit Report for 05/27/2021 Arrival Information Details Patient Name: Date of Service: Greg Quinn [facility] HN J. 05/27/2021 10:00 A M Medical Record Number: [id number] Patient Account Number: [id number] Date of Birth/Sex: Treating RN: [birth year] (51 y.o. Greg Quinn, Greg Primary Care Michaela Broski: Greg Quinn Other Clinician: Referring Kimala Horne: Treating Greg Quinn/Extender: Greg Quinn in Treatment: 3 Visit Information History Since Last Visit Added or deleted any medications: No Patient Arrived: Ambulatory Any new allergies or adverse reactions: No Arrival Time: 10:19 Had a fall or experienced change in No Accompanied By: self activities of daily living that may affect Transfer Assistance: None risk of falls: Patient Has Alerts: Yes Signs or symptoms of abuse/neglect since last visito No Patient Alerts: Patient on Blood Thinner Hospitalized since last visit: No Implantable device outside of the clinic excluding No cellular tissue based products placed in the center since last visit: Has Dressing in Place as Prescribed: Yes Pain Present Now: Yes Electronic Signature(s) Signed: 07/07/2021 8:46:54 AM By: Thayer Dallas Entered By: Thayer Dallas on 05/27/2021 10:21:48 -------------------------------------------------------------------------------- Clinic Level of Care Assessment Details Patient Name: Date of Service: Greg Quinn [facility] HN J. 05/27/2021 10:00 A M Medical Record Number: [id number] Patient Account Number: [id number] Date of Birth/Sex: Treating RN: 08/16/1970 (50 y.o. Greg Quinn, Greg Primary Care Anes Rigel: Greg Quinn Other Clinician: Referring Sarahann Horrell: Treating Greg Quinn/Extender: Greg Quinn in Treatment: 3 Clinic Level of Care Assessment Items TOOL 4 Quantity Score X- 1 0 Use when only an EandM is performed on FOLLOW-UP visit ASSESSMENTS - Nursing  Assessment / Reassessment X- 1 10 Reassessment of Co-morbidities (includes updates in patient status) X- 1 5 Reassessment of Adherence to Treatment Plan ASSESSMENTS - Wound and Skin A ssessment / Reassessment X - Simple Wound Assessment / Reassessment - one wound 1 5 []  - 0 Complex Wound Assessment / Reassessment - multiple wounds []  - 0 Dermatologic / Skin Assessment (not related to wound area) ASSESSMENTS - Focused Assessment []  - 0 Circumferential Edema Measurements - multi extremities []  - 0 Nutritional Assessment / Counseling / Intervention []  - 0 Lower Extremity Assessment (monofilament, tuning fork, pulses) []  - 0 Peripheral Arterial Disease Assessment (using hand held doppler) ASSESSMENTS - Ostomy and/or Continence Assessment and Care []  - 0 Incontinence Assessment and Management []  - 0 Ostomy Care Assessment and Management (repouching, etc.) PROCESS - Coordination of Care X - Simple Patient / Family Education for ongoing care 1 15 []  - 0 Complex (extensive) Patient / Family Education for ongoing care X- 1 10 Staff obtains , Records, T Results / Process Orders est []  - 0 Staff telephones HHA, Nursing Homes / Clarify orders / etc []  - 0 Routine Transfer to another Facility (non-emergent condition) []  - 0 Routine Hospital Admission (non-emergent condition) []  - 0 New Admissions / / Ordering NPWT Apligraf, etc. , []  - 0 Emergency Hospital Admission (emergent condition) X- 1 10 Simple Discharge Coordination []  - 0 Complex (extensive) Discharge Coordination PROCESS - Special Needs []  - 0 Pediatric / Minor Patient Management []  - 0 Isolation Patient Management []  - 0 Hearing / Language / Visual special needs []  - 0 Assessment of Community assistance (transportation, D/C planning, etc.) []  - 0 Additional assistance / Altered mentation []  - 0 Support Surface(s) Assessment (bed, cushion, seat, etc.) INTERVENTIONS - Wound  Cleansing / Measurement X - Simple Wound Cleansing - one wound 1 5 []  - 0 Complex Wound Cleansing -  multiple wounds X- 1 5 Wound Imaging (photographs - any number of wounds) []  - 0 Wound Tracing (instead of photographs) X- 1 5 Simple Wound Measurement - one wound []  - 0 Complex Wound Measurement - multiple wounds INTERVENTIONS - Wound Dressings X - Small Wound Dressing one or multiple wounds 1 10 []  - 0 Medium Wound Dressing one or multiple wounds []  - 0 Large Wound Dressing one or multiple wounds X- 1 5 Application of Medications - topical []  - 0 Application of Medications - injection INTERVENTIONS - Miscellaneous []  - 0 External ear exam []  - 0 Specimen Collection (cultures, biopsies, blood, body fluids, etc.) []  - 0 Specimen(s) / Culture(s) sent or taken to Lab for analysis []  - 0 Patient Transfer (multiple staff / Nurse, adultHoyer Lift / Similar devices) []  - 0 Simple Staple / Suture removal (25 or less) []  - 0 Complex Staple / Suture removal (26 or more) []  - 0 Hypo / Hyperglycemic Management (close monitor of Blood Glucose) []  - 0 Ankle / Brachial Index (ABI) - do not check if billed separately X- 1 5 Vital Signs Has the patient been seen at the hospital within the last three years: Yes Total Score: 90 Level Of Care: New/Established - Level 3 Electronic Signature(s) Signed: 05/28/2021 4:12:14 PM By: Fonnie MuBreedlove, [name] RN Entered By: Fonnie MuBreedlove, Greg on 05/27/2021 10:48:38 -------------------------------------------------------------------------------- Encounter Discharge Information Details Patient Name: Date of Service: Greg BrownieV ELA ZQ UEZ, CHA [facility]KA HN J. 05/27/2021 10:00 A M Medical Record Number: [id number] Patient Account Number: [id number] Date of Birth/Sex: Treating RN: Apr 30, 1970 (51 y.o. Greg MerlM) Quinn, Greg Primary Care Odelle Kosier: Greg BlueJohnson, Greg Other Clinician: Referring Raevon Broom: Treating Greg Quinn/Extender: Greg ShipStone Quinn, Greg Quinn, Greg Weeks in Treatment:  3 Encounter Discharge Information Items Discharge Condition: Stable Ambulatory Status: Ambulatory Discharge Destination: Home Transportation: Private Auto Accompanied By: self Schedule Follow-up Appointment: Yes Clinical Summary of Care: Patient Declined Electronic Signature(s) Signed: 05/28/2021 4:12:14 PM By: Fonnie MuBreedlove, [name] RN Entered By: Fonnie MuBreedlove, Greg on 05/27/2021 10:54:40 -------------------------------------------------------------------------------- Lower Extremity Assessment Details Patient Name: Date of Service: Greg BrownieV ELA ZQ UEZ, CHA ERKA HN J. 05/27/2021 10:00 A M Medical Record Number: [id number] Patient Account Number: [id number] Date of Birth/Sex: Treating RN: Apr 30, 1970 (51 y.o. Lucious GrovesM) Quinn, Greg Primary Care Jaquawn Saffran: Greg BlueJohnson, Greg Other Clinician: Referring Daleiza Bacchi: Treating Greg Quinn/Extender: Greg ShipStone Quinn, Greg Quinn, Greg Weeks in Treatment: 3 Electronic Signature(s) Signed: 05/28/2021 4:12:14 PM By: Fonnie MuBreedlove, [name] RN Entered By: Fonnie MuBreedlove, Greg on 05/27/2021 10:21:07 -------------------------------------------------------------------------------- Multi-Disciplinary Care Plan Details Patient Name: Date of Service: Greg BrownieV ELA ZQ UEZ, CHA ERKA HN J. 05/27/2021 10:00 A M Medical Record Number: [id number] Patient Account Number: [id number] Date of Birth/Sex: Treating RN: Apr 30, 1970 (51 y.o. Greg MerlM) Quinn, Greg Primary Care Leamon Palau: Greg BlueJohnson, Greg Other Clinician: Referring Emmely Bittinger: Treating Sidni Fusco/Extender: Greg ShipStone Quinn, Greg Quinn, Greg Weeks in Treatment: 3 Active Inactive Wound/Skin Impairment Nursing Diagnoses: Impaired tissue integrity Knowledge deficit related to ulceration/compromised skin integrity Goals: Patient will have a decrease in wound volume by X% from date: (specify in notes) Date Initiated: 05/06/2021 Target Resolution Date: 06/27/2021 Goal Status: Active Patient/caregiver will verbalize understanding of skin care regimen Date  Initiated: 05/06/2021 Target Resolution Date: 06/27/2021 Goal Status: Active Ulcer/skin breakdown will have a volume reduction of 30% by week 4 Date Initiated: 05/06/2021 Target Resolution Date: 06/27/2021 Goal Status: Active Interventions: Assess patient/caregiver ability to obtain necessary supplies Assess patient/caregiver ability to perform ulcer/skin care regimen upon admission and as needed Assess ulceration(s) every visit Notes: Electronic Signature(s) Signed: 05/28/2021 4:12:14 PM By: Fonnie MuBreedlove, [name] RN Entered By: Bryon LionsBreedlove,  Greg on 05/27/2021 10:44:23 -------------------------------------------------------------------------------- Pain Assessment Details Patient Name: Date of Service: Greg Quinn [facility] HN J. 05/27/2021 10:00 A M Medical Record Number: [id number] Patient Account Number: [id number] Date of Birth/Sex: Treating RN: 10/20/1970 (51 y.o. Lucious Groves Primary Care Tryone Kille: Greg Quinn Other Clinician: Referring Eren Ryser: Treating Delton Stelle/Extender: Greg Quinn in Treatment: 3 Active Problems Location of Pain Severity and Description of Pain Patient Has Paino Yes Site Locations Pain Location: Pain Location: Pain in Ulcers Duration of the Pain. Constant / Intermittento Constant Rate the pain. Current Pain Level: 8 Character of Pain Describe the Pain: Burning, Stabbing Pain Management and Medication Current Pain Management: Rest: Yes How does your wound impact your activities of daily livingo Sleep: Yes Work: Yes Psychologist, prison and probation services) Signed: 05/28/2021 4:12:14 PM By: Fonnie Mu RN Signed: 07/07/2021 8:46:54 AM By: Thayer Dallas Entered By: Thayer Dallas on 05/27/2021 10:23:51 -------------------------------------------------------------------------------- Patient/Caregiver Education Details Patient Name: Date of Service: Greg Quinn [facility]KA HN J. 5/3/2023andnbsp10:00 A M Medical Record Number:  [id number] Patient Account Number: [id number] Date of Birth/Gender: Treating RN: [birth year] (51 y.o. Lucious Groves Primary Care Physician: Greg Quinn Other Clinician: Referring Physician: Treating Physician/Extender: Greg Quinn in Treatment: 3 Education Assessment Education Provided To: Patient Education Topics Provided Wound/Skin Impairment: Methods: Explain/Verbal Responses: Reinforcements needed, State content correctly Electronic Signature(s) Signed: 05/28/2021 4:12:14 PM By: Fonnie Mu RN Entered By: Fonnie Mu on 05/27/2021 10:44:36 -------------------------------------------------------------------------------- Wound Assessment Details Patient Name: Date of Service: Greg Quinn [facility]KA HN J. 05/27/2021 10:00 A M Medical Record Number: [id number] Patient Account Number: [id number] Date of Birth/Sex: Treating RN: Apr 27, 1970 (51 y.o. Greg Quinn, Greg Primary Care Jacci Ruberg: Greg Quinn Other Clinician: Referring Danyela Posas: Treating Josiyah Tozzi/Extender: Greg Quinn in Treatment: 3 Wound Status Wound Number: 1 Primary Open Surgical Wound Etiology: Wound Location: Scrotum Wound Status: Open Wounding Event: Surgical Injury Comorbid Coronary Artery Disease, Deep Vein Thrombosis, Date Acquired: 04/18/2021 History: Hypertension Weeks Of Treatment: 3 Clustered Wound: No Photos Wound Measurements Length: (cm) 2.5 Width: (cm) 1 Depth: (cm) 0.2 Area: (cm) 1.963 Volume: (cm) 0.393 % Reduction in Area: 85.3% % Reduction in Volume: 98.7% Epithelialization: Medium (34-66%) Tunneling: No Undermining: Yes Starting Position (o'clock): 2 Ending Position (o'clock): 4 Maximum Distance: (cm) 0.2 Wound Description Classification: Full Thickness With Exposed Support Structures Wound Margin: Distinct, outline attached Exudate Amount: Medium Exudate Type: Serosanguineous Exudate Color: red,  brown Foul Odor After Cleansing: No Slough/Fibrino No Wound Bed Granulation Amount: Large (67-100%) Exposed Structure Granulation Quality: Red, Pink Fascia Exposed: No Necrotic Amount: None Present (0%) Fat Layer (Subcutaneous Tissue) Exposed: Yes Tendon Exposed: No Muscle Exposed: No Joint Exposed: No Bone Exposed: No Electronic Signature(s) Signed: 05/28/2021 4:12:14 PM By: Fonnie Mu RN Entered By: Fonnie Mu on 05/27/2021 10:37:49 -------------------------------------------------------------------------------- Vitals Details Patient Name: Date of Service: Greg Quinn [facility]KA HN J. 05/27/2021 10:00 A M Medical Record Number: [id number] Patient Account Number: [id number] Date of Birth/Sex: Treating RN: 12/16/1970 (51 y.o. Greg Quinn, Greg Primary Care Aarik Blank: Greg Quinn Other Clinician: Referring Huber Mathers: Treating Tiffani Kadow/Extender: Greg Quinn in Treatment: 3 Vital Signs Time Taken: 10:21 Temperature (F): 98.1 Height (in): 70 Pulse (bpm): 80 Weight (lbs): 267 Respiratory Rate (breaths/min): 18 Body Mass Index (BMI): 38.3 Blood Pressure (mmHg): 157/99 Reference Range: 80 - 120 mg / dl Electronic Signature(s) Signed: 07/07/2021 8:46:54 AM By: Thayer Dallas Entered By: Thayer Dallas on 05/27/2021 10:22:20

## 2021-07-09 ENCOUNTER — Ambulatory Visit: Payer: BC Managed Care – PPO | Admitting: Internal Medicine

## 2021-09-12 ENCOUNTER — Other Ambulatory Visit: Payer: Self-pay

## 2021-09-12 ENCOUNTER — Emergency Department (HOSPITAL_COMMUNITY): Payer: BC Managed Care – PPO

## 2021-09-12 ENCOUNTER — Encounter (HOSPITAL_COMMUNITY): Payer: Self-pay | Admitting: Emergency Medicine

## 2021-09-12 ENCOUNTER — Emergency Department (HOSPITAL_COMMUNITY)
Admission: EM | Admit: 2021-09-12 | Discharge: 2021-09-13 | Disposition: A | Discharge status: Home / Self Care | Payer: BC Managed Care – PPO | Attending: Emergency Medicine | Admitting: Emergency Medicine

## 2021-09-12 DIAGNOSIS — S6991XA Unspecified injury of right wrist, hand and finger(s), initial encounter: Secondary | ICD-10-CM | POA: Diagnosis not present

## 2021-09-12 DIAGNOSIS — Z794 Long term (current) use of insulin: Secondary | ICD-10-CM | POA: Insufficient documentation

## 2021-09-12 DIAGNOSIS — W231XXA Caught, crushed, jammed, or pinched between stationary objects, initial encounter: Secondary | ICD-10-CM | POA: Diagnosis not present

## 2021-09-12 DIAGNOSIS — M7989 Other specified soft tissue disorders: Secondary | ICD-10-CM | POA: Diagnosis not present

## 2021-09-12 NOTE — ED Triage Notes (Signed)
Patient injured his right 4th finger this evening while playing with his grandchild , presents with mild swelling / pain with movement .

## 2021-09-13 MED ORDER — OXYCODONE-ACETAMINOPHEN 5-325 MG PO TABS
1.0000 | ORAL_TABLET | Freq: Once | ORAL | Status: AC
  Administered 2021-09-13: 1 via ORAL
  Filled 2021-09-13: qty 1

## 2021-09-13 NOTE — Discharge Instructions (Signed)
Tylenol or motrin as needed for pain. Follow-up with hand specialist if ongoing issues. Return here for new concerns.

## 2021-09-13 NOTE — ED Provider Notes (Signed)
[facility] EMERGENCY DEPARTMENT Provider Note   CSN: [id number] Arrival date & time: 09/12/21  2237     History  Chief Complaint  Patient presents with   Finger Injury    Greg Quinn is a 51 y.o. male.  The history is provided by the patient and medical records.   51 y.o. M presenting to the ED with right 4th digit injury.  States he was throwing his grandson into the air on his bed when he caught him awkwardly and finger jammed into the bedframe.  States worsening pain/swelling over the past several hours.  He is right hand dominant.  No intervention tried PTA.  Home Medications Prior to Admission medications   Medication Sig Start Date End Date Taking? Authorizing Provider  Accu-Chek Softclix Lancets lancets Use up to 4 times daily as directed 05/01/21   Raiford Noble Latif, DO  albuterol (VENTOLIN HFA) 108 (90 Base) MCG/ACT inhaler Inhale 1-2 puffs into the lungs 4 (four) times daily as needed for wheezing or shortness of breath. 05/01/21   Raiford Noble Latif, DO  Ascorbic Acid (VITAMIN C) 1000 MG tablet Take 1,000 mg by mouth 2 (two) times daily.    [provider]  atorvastatin (LIPITOR) 80 MG tablet Take 1 tablet (80 mg total) by mouth daily. 05/08/21   Ladell Pier, MD  Blood Glucose Monitoring Suppl (BLOOD GLUCOSE MONITOR SYSTEM) w/Device KIT Use as directed 05/01/21   Raiford Noble Latif, DO  clopidogrel (PLAVIX) 75 MG tablet Take 1 tablet (75 mg total) by mouth daily. 05/08/21   Ladell Pier, MD  diclofenac Sodium (VOLTAREN) 1 % GEL Apply 4 g topically 4 (four) times daily. Patient taking differently: Apply 4 g topically 3 (three) times daily. 04/20/21   Deno Etienne, DO  glucose blood test strip Use up to 4 times daily 05/01/21   Raiford Noble Latif, DO  HYDROcodone-acetaminophen (NORCO/VICODIN) 5-325 MG tablet Take 1 tablet by mouth 2 (two) times daily as needed for moderate pain. 05/08/21   Ladell Pier, MD  hydrocortisone  cream 1 % Apply topically 2 (two) times daily. 05/01/21   Raiford Noble Latif, DO  insulin detemir (LEVEMIR) 100 UNIT/ML FlexPen Inject 35 Units into the skin daily. 05/08/21   Ladell Pier, MD  Insulin Pen Needle 32G X 4 MM MISC use as directed daily with insulin 05/01/21   Sheikh, Georgina Quint Latif, DO  lisinopril (ZESTRIL) 20 MG tablet Take 1 tablet (20 mg total) by mouth daily. 05/08/21   Ladell Pier, MD  metFORMIN (GLUCOPHAGE-XR) 750 MG 24 hr tablet Take 1 tablet (750 mg total) by mouth 2 (two) times daily. 05/08/21   Ladell Pier, MD  metoprolol succinate (TOPROL-XL) 25 MG 24 hr tablet Take 1 tablet (25 mg total) by mouth daily. 05/08/21   Ladell Pier, MD  nitroGLYCERIN (NITROSTAT) 0.4 MG SL tablet Place 1 tablet (0.4 mg total) under the tongue every 5 (five) minutes as needed for chest pain. 08/21/20 08/21/21  Margie Billet, NP  XARELTO 20 MG TABS tablet Take 1 tablet (20 mg total) by mouth daily. 05/08/21   Ladell Pier, MD      Allergies    Heparin and Pork-derived products    Review of Systems   Review of Systems  Musculoskeletal:  Positive for arthralgias.  All other systems reviewed and are negative.   Physical Exam Updated Vital Signs BP (!) 152/89 (BP Location: Right Arm)   Pulse 81   Temp  97.7 F (36.5 C) (Oral)   Resp 16   SpO2 96%   Physical Exam Vitals and nursing note reviewed.  Constitutional:      Appearance: He is well-developed.  HENT:     Head: Normocephalic and atraumatic.  Eyes:     Conjunctiva/sclera: Conjunctivae normal.     Pupils: Pupils are equal, round, and reactive to light.  Cardiovascular:     Rate and Rhythm: Normal rate and regular rhythm.     Heart sounds: Normal heart sounds.  Pulmonary:     Effort: Pulmonary effort is normal. No respiratory distress.     Breath sounds: Normal breath sounds. No rhonchi.  Abdominal:     General: Bowel sounds are normal.     Palpations: Abdomen is soft.     Tenderness: There is no  abdominal tenderness. There is no rebound.  Musculoskeletal:        General: Normal range of motion.     Cervical back: Normal range of motion.     Comments: Right 4th digit with swelling and tenderness over the PIP joint, there is no bony deformity, finger is held in a slightly flexed position, worsening pain when attempted to straighten, normal cap refill and sensation  Skin:    General: Skin is warm and dry.  Neurological:     Mental Status: He is alert and oriented to person, place, and time.     ED Results / Procedures / Treatments   Labs (all labs ordered are listed, but only abnormal results are displayed) Labs Reviewed - No data to display  EKG None  Radiology DG Finger Ring Right  Result Date: 09/12/2021 CLINICAL DATA:  Trauma and swelling to the right fourth digit. EXAM: RIGHT RING FINGER 2+V COMPARISON:  None Available. FINDINGS: No acute fracture or dislocation. The bones are osteopenic. There is mild degenerative changes and spurring of the interphalangeal joints. There is mild soft tissue swelling of the fourth digit. No radiopaque foreign object or soft tissue gas. IMPRESSION: No acute fracture or dislocation. Electronically Signed   By: Anner Crete M.D.   On: 09/12/2021 23:52    Procedures Procedures    Medications Ordered in ED Medications  oxyCODONE-acetaminophen (PERCOCET/ROXICET) 5-325 MG per tablet 1 tablet (1 tablet Oral Given 09/13/21 0454)    ED Course/ Medical Decision Making/ A&P                           Medical Decision Making Amount and/or Complexity of Data Reviewed Radiology: ordered and independent interpretation performed.  Risk Prescription drug management.   51 year old male here with right fourth digit injury while playing with his grandson.  Has swelling along the right fourth PIP joint but no acute deformity.  Hand is neurovascular intact.  X-ray negative for any acute bony findings.  Suspect jam type injury.  Patient placed in  static splint, will refer to hand for follow-up if needed.  Tylenol/Motrin for pain.  Can return here for new concerns.  Final Clinical Impression(s) / ED Diagnoses Final diagnoses:  Injury of finger of right hand, initial encounter    Rx / DC Orders ED Discharge Orders     None         Larene Pickett, PA-C 09/13/21 4132    Quintella Reichert, MD 09/14/21 [phone]

## 2021-09-13 NOTE — Progress Notes (Signed)
Orthopedic Tech Progress Note Patient Details:  Greg Quinn Sep 19, 1970 [id number]  Ortho Devices Type of Ortho Device: Finger splint Ortho Device/Splint Location: rue ring finger Ortho Device/Splint Interventions: Ordered, Application   Post Interventions Patient Tolerated: Well Instructions Provided: Adjustment of device, Care of device  Trinna Post 09/13/2021, 5:29 AM

## 2021-09-22 ENCOUNTER — Other Ambulatory Visit (HOSPITAL_COMMUNITY): Payer: Self-pay

## 2021-10-20 ENCOUNTER — Other Ambulatory Visit (HOSPITAL_COMMUNITY): Payer: Self-pay

## 2021-10-24 ENCOUNTER — Emergency Department (HOSPITAL_COMMUNITY)
Admission: EM | Admit: 2021-10-24 | Discharge: 2021-10-25 | Discharge status: Against Medical Advice | Payer: BC Managed Care – PPO | Attending: Emergency Medicine | Admitting: Emergency Medicine

## 2021-10-24 DIAGNOSIS — D72829 Elevated white blood cell count, unspecified: Secondary | ICD-10-CM | POA: Insufficient documentation

## 2021-10-24 DIAGNOSIS — R110 Nausea: Secondary | ICD-10-CM | POA: Insufficient documentation

## 2021-10-24 DIAGNOSIS — E119 Type 2 diabetes mellitus without complications: Secondary | ICD-10-CM | POA: Diagnosis not present

## 2021-10-24 DIAGNOSIS — Z794 Long term (current) use of insulin: Secondary | ICD-10-CM | POA: Insufficient documentation

## 2021-10-24 DIAGNOSIS — R1010 Upper abdominal pain, unspecified: Secondary | ICD-10-CM | POA: Diagnosis not present

## 2021-10-24 DIAGNOSIS — K859 Acute pancreatitis without necrosis or infection, unspecified: Secondary | ICD-10-CM | POA: Diagnosis not present

## 2021-10-24 DIAGNOSIS — Z5321 Procedure and treatment not carried out due to patient leaving prior to being seen by health care provider: Secondary | ICD-10-CM | POA: Insufficient documentation

## 2021-10-24 DIAGNOSIS — I2510 Atherosclerotic heart disease of native coronary artery without angina pectoris: Secondary | ICD-10-CM | POA: Insufficient documentation

## 2021-10-24 DIAGNOSIS — K824 Cholesterolosis of gallbladder: Secondary | ICD-10-CM | POA: Diagnosis not present

## 2021-10-24 DIAGNOSIS — R1011 Right upper quadrant pain: Secondary | ICD-10-CM | POA: Diagnosis not present

## 2021-10-24 DIAGNOSIS — Z7901 Long term (current) use of anticoagulants: Secondary | ICD-10-CM | POA: Diagnosis not present

## 2021-10-24 DIAGNOSIS — I10 Essential (primary) hypertension: Secondary | ICD-10-CM | POA: Diagnosis not present

## 2021-10-24 DIAGNOSIS — I700 Atherosclerosis of aorta: Secondary | ICD-10-CM | POA: Diagnosis not present

## 2021-10-24 DIAGNOSIS — K760 Fatty (change of) liver, not elsewhere classified: Secondary | ICD-10-CM | POA: Diagnosis not present

## 2021-10-24 DIAGNOSIS — R109 Unspecified abdominal pain: Secondary | ICD-10-CM | POA: Diagnosis not present

## 2021-10-24 DIAGNOSIS — R9431 Abnormal electrocardiogram [ECG] [EKG]: Secondary | ICD-10-CM | POA: Diagnosis not present

## 2021-10-25 ENCOUNTER — Emergency Department (HOSPITAL_COMMUNITY)
Admission: EM | Admit: 2021-10-25 | Discharge: 2021-10-26 | Disposition: A | Discharge status: Home / Self Care | Payer: BC Managed Care – PPO | Source: Home / Self Care | Attending: Emergency Medicine | Admitting: Emergency Medicine

## 2021-10-25 ENCOUNTER — Other Ambulatory Visit: Payer: Self-pay

## 2021-10-25 ENCOUNTER — Encounter (HOSPITAL_COMMUNITY): Payer: Self-pay | Admitting: Emergency Medicine

## 2021-10-25 DIAGNOSIS — Z794 Long term (current) use of insulin: Secondary | ICD-10-CM | POA: Insufficient documentation

## 2021-10-25 DIAGNOSIS — K859 Acute pancreatitis without necrosis or infection, unspecified: Secondary | ICD-10-CM | POA: Insufficient documentation

## 2021-10-25 DIAGNOSIS — R9431 Abnormal electrocardiogram [ECG] [EKG]: Secondary | ICD-10-CM | POA: Diagnosis not present

## 2021-10-25 DIAGNOSIS — D72829 Elevated white blood cell count, unspecified: Secondary | ICD-10-CM | POA: Insufficient documentation

## 2021-10-25 DIAGNOSIS — Z7901 Long term (current) use of anticoagulants: Secondary | ICD-10-CM | POA: Insufficient documentation

## 2021-10-25 DIAGNOSIS — I10 Essential (primary) hypertension: Secondary | ICD-10-CM | POA: Insufficient documentation

## 2021-10-25 DIAGNOSIS — E119 Type 2 diabetes mellitus without complications: Secondary | ICD-10-CM | POA: Insufficient documentation

## 2021-10-25 DIAGNOSIS — I2510 Atherosclerotic heart disease of native coronary artery without angina pectoris: Secondary | ICD-10-CM | POA: Insufficient documentation

## 2021-10-25 LAB — COMPREHENSIVE METABOLIC PANEL
ALT: 13 U/L (ref 0–44)
AST: 17 U/L (ref 15–41)
Albumin: 3.4 g/dL — ABNORMAL LOW (ref 3.5–5.0)
Alkaline Phosphatase: 123 U/L (ref 38–126)
Anion gap: 9 (ref 5–15)
BUN: 6 mg/dL (ref 6–20)
CO2: 23 mmol/L (ref 22–32)
Calcium: 9.3 mg/dL (ref 8.9–10.3)
Chloride: 102 mmol/L (ref 98–111)
Creatinine, Ser: 0.8 mg/dL (ref 0.61–1.24)
GFR, Estimated: >60 mL/min (ref >60)
Glucose, Bld: 338 mg/dL — ABNORMAL HIGH (ref 70–99)
Potassium: 4 mmol/L (ref 3.5–5.1)
Sodium: 134 mmol/L — ABNORMAL LOW (ref 135–145)
Total Bilirubin: 0.5 mg/dL (ref 0.3–1.2)
Total Protein: 7.1 g/dL (ref 6.5–8.1)

## 2021-10-25 LAB — CBC
HCT: 43.2 % (ref 39.0–52.0)
Hemoglobin: 15 g/dL (ref 13.0–17.0)
MCH: 29.8 pg (ref 26.0–34.0)
MCHC: 34.7 g/dL (ref 30.0–36.0)
MCV: 85.7 fL (ref 80.0–100.0)
Platelets: 259 10*3/uL (ref 150–400)
RBC: 5.04 MIL/uL (ref 4.22–5.81)
RDW: 12.3 % (ref 11.5–15.5)
WBC: 10.1 10*3/uL (ref 4.0–10.5)
nRBC: 0 % (ref 0.0–0.2)

## 2021-10-25 LAB — CBC WITH DIFFERENTIAL/PLATELET
Abs Immature Granulocytes: 0.03 10*3/uL (ref 0.00–0.07)
Basophils Absolute: 0 10*3/uL (ref 0.0–0.1)
Basophils Relative: 0 %
Eosinophils Absolute: 0.1 10*3/uL (ref 0.0–0.5)
Eosinophils Relative: 1 %
HCT: 46.5 % (ref 39.0–52.0)
Hemoglobin: 15.6 g/dL (ref 13.0–17.0)
Immature Granulocytes: 0 %
Lymphocytes Relative: 17 %
Lymphs Abs: 1.9 10*3/uL (ref 0.7–4.0)
MCH: 29.4 pg (ref 26.0–34.0)
MCHC: 33.5 g/dL (ref 30.0–36.0)
MCV: 87.7 fL (ref 80.0–100.0)
Monocytes Absolute: 0.8 10*3/uL (ref 0.1–1.0)
Monocytes Relative: 7 %
Neutro Abs: 8.1 10*3/uL — ABNORMAL HIGH (ref 1.7–7.7)
Neutrophils Relative %: 75 %
Platelets: 276 10*3/uL (ref 150–400)
RBC: 5.3 MIL/uL (ref 4.22–5.81)
RDW: 12.4 % (ref 11.5–15.5)
WBC: 11 10*3/uL — ABNORMAL HIGH (ref 4.0–10.5)
nRBC: 0 % (ref 0.0–0.2)

## 2021-10-25 LAB — URINALYSIS, ROUTINE W REFLEX MICROSCOPIC
Bilirubin Urine: NEGATIVE
Bilirubin Urine: NEGATIVE
Glucose, UA: >=500 mg/dL — AB
Glucose, UA: >=500 mg/dL — AB
Hgb urine dipstick: NEGATIVE
Hgb urine dipstick: NEGATIVE
Ketones, ur: NEGATIVE mg/dL
Ketones, ur: NEGATIVE mg/dL
Leukocytes,Ua: NEGATIVE
Nitrite: NEGATIVE
Nitrite: NEGATIVE
Protein, ur: 30 mg/dL — AB
Protein, ur: NEGATIVE mg/dL
Specific Gravity, Urine: 1.022 (ref 1.005–1.030)
Specific Gravity, Urine: 1.03 (ref 1.005–1.030)
pH: 6 (ref 5.0–8.0)
pH: 6 (ref 5.0–8.0)

## 2021-10-25 LAB — LIPASE, BLOOD: Lipase: 118 U/L — ABNORMAL HIGH (ref 11–51)

## 2021-10-25 NOTE — ED Triage Notes (Signed)
Pt here for upper abd pain x2 days w/ only 1 episode of nausea. Pt denies cp/sob, diarrhea, constipation. Hx of MI w/ stent placed

## 2021-10-25 NOTE — ED Triage Notes (Signed)
Patient reports persistent pain across his abdomen for several days , no emesis or diarrhea .

## 2021-10-25 NOTE — ED Notes (Signed)
Pt left due to not being seen quick enough 

## 2021-10-25 NOTE — ED Provider Triage Note (Signed)
Emergency Medicine Provider Triage Evaluation Note  Greg Quinn , a 51 y.o. male  was evaluated in triage.  Pt complains of 3 days of upper abdominal pain in the epigastric region.  Patient describes the pain as sharp and rates it as 10 out of 10 in severity.  He does endorse some mild fluctuation in pain but states it is always severe.  He has not taken any medication at home.  He states that he came to the emergency department last night but left due to wait times.  He does endorse nausea but denies any vomiting, chest pain, shortness of breath, urinary symptoms, constipation, diarrhea.  Patient denies any history of reflux or peptic ulcer disease.  Does endorse myocardial infarction approximately 1-1/2 years ago  Review of Systems  Positive: As above Negative: As above  Physical Exam  There were no vitals taken for this visit. Gen:   Awake, no distress   Resp:  Normal effort  MSK:   Moves extremities without difficulty  Other:  Tenderness to palpation across the epigastric region.  Medical Decision Making  Medically screening exam initiated at 11:19 PM.  Appropriate orders placed.  McCaskill was informed that the remainder of the evaluation will be completed by another provider, this initial triage assessment does not replace that evaluation, and the importance of remaining in the ED until their evaluation is complete.     Dorothyann Peng, PA-C 10/25/21 2321

## 2021-10-26 ENCOUNTER — Emergency Department (HOSPITAL_COMMUNITY): Payer: BC Managed Care – PPO

## 2021-10-26 ENCOUNTER — Encounter (HOSPITAL_COMMUNITY): Payer: Self-pay

## 2021-10-26 DIAGNOSIS — I700 Atherosclerosis of aorta: Secondary | ICD-10-CM | POA: Diagnosis not present

## 2021-10-26 DIAGNOSIS — R1011 Right upper quadrant pain: Secondary | ICD-10-CM | POA: Diagnosis not present

## 2021-10-26 DIAGNOSIS — R109 Unspecified abdominal pain: Secondary | ICD-10-CM | POA: Diagnosis not present

## 2021-10-26 DIAGNOSIS — K760 Fatty (change of) liver, not elsewhere classified: Secondary | ICD-10-CM | POA: Diagnosis not present

## 2021-10-26 DIAGNOSIS — K824 Cholesterolosis of gallbladder: Secondary | ICD-10-CM | POA: Diagnosis not present

## 2021-10-26 LAB — COMPREHENSIVE METABOLIC PANEL
ALT: 17 U/L (ref 0–44)
AST: 14 U/L — ABNORMAL LOW (ref 15–41)
Albumin: 3.7 g/dL (ref 3.5–5.0)
Alkaline Phosphatase: 121 U/L (ref 38–126)
Anion gap: 11 (ref 5–15)
BUN: 11 mg/dL (ref 6–20)
CO2: 23 mmol/L (ref 22–32)
Calcium: 9.6 mg/dL (ref 8.9–10.3)
Chloride: 101 mmol/L (ref 98–111)
Creatinine, Ser: 0.88 mg/dL (ref 0.61–1.24)
GFR, Estimated: >60 mL/min (ref >60)
Glucose, Bld: 278 mg/dL — ABNORMAL HIGH (ref 70–99)
Potassium: 4.3 mmol/L (ref 3.5–5.1)
Sodium: 135 mmol/L (ref 135–145)
Total Bilirubin: 0.5 mg/dL (ref 0.3–1.2)
Total Protein: 7.4 g/dL (ref 6.5–8.1)

## 2021-10-26 LAB — LIPASE, BLOOD: Lipase: 192 U/L — ABNORMAL HIGH (ref 11–51)

## 2021-10-26 MED ORDER — ONDANSETRON HCL 4 MG/2ML IJ SOLN
4.0000 mg | Freq: Once | INTRAMUSCULAR | Status: AC
  Administered 2021-10-26: 4 mg via INTRAVENOUS
  Filled 2021-10-26: qty 2

## 2021-10-26 MED ORDER — SODIUM CHLORIDE 0.9 % IV BOLUS
1000.0000 mL | Freq: Once | INTRAVENOUS | Status: AC
  Administered 2021-10-26: 1000 mL via INTRAVENOUS

## 2021-10-26 MED ORDER — ONDANSETRON HCL 4 MG PO TABS: 4.0000 mg | ORAL_TABLET | Freq: Four times a day (QID) | ORAL | 0 refills | Status: DC

## 2021-10-26 MED ORDER — IOHEXOL 350 MG/ML SOLN
75.0000 mL | Freq: Once | INTRAVENOUS | Status: AC | PRN
  Administered 2021-10-26: 75 mL via INTRAVENOUS

## 2021-10-26 MED ORDER — DICYCLOMINE HCL 20 MG PO TABS: 20.0000 mg | ORAL_TABLET | Freq: Every day | ORAL | 0 refills | Status: DC | PRN

## 2021-10-26 MED ORDER — HYDROMORPHONE HCL 1 MG/ML IJ SOLN
1.0000 mg | Freq: Once | INTRAMUSCULAR | Status: AC
  Administered 2021-10-26: 1 mg via INTRAVENOUS
  Filled 2021-10-26: qty 1

## 2021-10-26 NOTE — Discharge Instructions (Signed)
Lab shows a of pancreatitis which is the cause of your pain, I recommend a bland diet, for the next couple days, I have given you Zofran to take for nausea please take as prescribed.  Also given you Bentyl  Call your PCP as needed  Come back to the emergency department if you develop chest pain, shortness of breath, severe abdominal pain, uncontrolled nausea, vomiting, diarrhea.

## 2021-10-26 NOTE — ED Provider Notes (Signed)
[facility] EMERGENCY DEPARTMENT Provider Note   CSN: [id number] Arrival date & time: 10/25/21  2307     History  Chief Complaint  Patient presents with   Abdominal Pain    [place] is a 51 y.o. male.  HPI   Patient with medical history including DVT, diabetes, PE, hypertension, CAD CAD, NSTEMI currently on Xarelto presents to the with complaints of epigastric tenderness.  Been going on for last 3 days, states she feels pain in that area does not radiate, does not go into his back, associated nausea without vomiting, patient states he still passing gas having normal bowel movements, denies melena or hematochezia, he denies any chest pain or shortness of breath denies pleuritic chest pain, he has no abdominal surgeries, states he does drink alcohol does not drink any recently, denies any NSAID use, he has no other complaints at this time.    Home Medications Prior to Admission medications   Medication Sig Start Date End Date Taking? Authorizing Provider  Multiple Vitamin (MULTIVITAMIN WITH MINERALS) TABS tablet Take 1 tablet by mouth daily.   Yes [provider]  ondansetron (ZOFRAN) 4 MG tablet Take 1 tablet (4 mg total) by mouth every 6 (six) hours. 10/26/21  Yes Marcello Fennel, PA-C  Accu-Chek Softclix Lancets lancets Use up to 4 times daily as directed 05/01/21   Raiford Noble Latif, DO  albuterol (VENTOLIN HFA) 108 (90 Base) MCG/ACT inhaler Inhale 1-2 puffs into the lungs 4 (four) times daily as needed for wheezing or shortness of breath. Patient not taking: Reported on 10/26/2021 05/01/21   Raiford Noble Latif, DO  Ascorbic Acid (VITAMIN C) 1000 MG tablet Take 1,000 mg by mouth 2 (two) times daily. Patient not taking: Reported on 10/26/2021    [provider]  atorvastatin (LIPITOR) 80 MG tablet Take 1 tablet (80 mg total) by mouth daily. Patient not taking: Reported on 10/26/2021 05/08/21   Ladell Pier, MD  Blood Glucose  Monitoring Suppl (BLOOD GLUCOSE MONITOR SYSTEM) w/Device KIT Use as directed 05/01/21   Raiford Noble Latif, DO  clopidogrel (PLAVIX) 75 MG tablet Take 1 tablet (75 mg total) by mouth daily. Patient not taking: Reported on 10/26/2021 05/08/21   Ladell Pier, MD  diclofenac Sodium (VOLTAREN) 1 % GEL Apply 4 g topically 4 (four) times daily. Patient taking differently: Apply 4 g topically 3 (three) times daily. 04/20/21   Deno Etienne, DO  glucose blood test strip Use up to 4 times daily 05/01/21   Raiford Noble Latif, DO  HYDROcodone-acetaminophen (NORCO/VICODIN) 5-325 MG tablet Take 1 tablet by mouth 2 (two) times daily as needed for moderate pain. Patient not taking: Reported on 10/26/2021 05/08/21   Ladell Pier, MD  hydrocortisone cream 1 % Apply topically 2 (two) times daily. Patient not taking: Reported on 10/26/2021 05/01/21   Raiford Noble Latif, DO  insulin detemir (LEVEMIR) 100 UNIT/ML FlexPen Inject 35 Units into the skin daily. Patient not taking: Reported on 10/26/2021 05/08/21   Ladell Pier, MD  Insulin Pen Needle 32G X 4 MM MISC use as directed daily with insulin 05/01/21   Raiford Noble Latif, DO  lisinopril (ZESTRIL) 20 MG tablet Take 1 tablet (20 mg total) by mouth daily. Patient not taking: Reported on 10/26/2021 05/08/21   Ladell Pier, MD  metFORMIN (GLUCOPHAGE-XR) 750 MG 24 hr tablet Take 1 tablet (750 mg total) by mouth 2 (two) times daily. Patient not taking: Reported on 10/26/2021 05/08/21  Ladell Pier, MD  metoprolol succinate (TOPROL-XL) 25 MG 24 hr tablet Take 1 tablet (25 mg total) by mouth daily. Patient not taking: Reported on 10/26/2021 05/08/21   Ladell Pier, MD  nitroGLYCERIN (NITROSTAT) 0.4 MG SL tablet Place 1 tablet (0.4 mg total) under the tongue every 5 (five) minutes as needed for chest pain. Patient not taking: Reported on 10/26/2021 08/21/20 08/21/21  Margie Billet, NP  XARELTO 20 MG TABS tablet Take 1 tablet (20 mg total) by mouth  daily. Patient not taking: Reported on 10/26/2021 05/08/21   Ladell Pier, MD      Allergies    Heparin and Pork-derived products    Review of Systems   Review of Systems  Constitutional:  Negative for chills and fever.  Respiratory:  Negative for shortness of breath.   Cardiovascular:  Negative for chest pain.  Gastrointestinal:  Positive for abdominal pain and nausea. Negative for vomiting.  Neurological:  Negative for headaches.    Physical Exam Updated Vital Signs BP 129/65   Pulse 74   Temp 98.4 F (36.9 C) (Oral)   Resp 20   SpO2 98%  Physical Exam Vitals and nursing note reviewed.  Constitutional:      General: He is not in acute distress.    Appearance: He is not ill-appearing.  HENT:     Head: Normocephalic and atraumatic.     Nose: No congestion.  Eyes:     Conjunctiva/sclera: Conjunctivae normal.  Cardiovascular:     Rate and Rhythm: Normal rate and regular rhythm.     Pulses: Normal pulses.     Heart sounds: No murmur heard.    No friction rub. No gallop.  Pulmonary:     Effort: No respiratory distress.     Breath sounds: No wheezing, rhonchi or rales.  Abdominal:     Palpations: Abdomen is soft.     Tenderness: There is abdominal tenderness. There is no right CVA tenderness or left CVA tenderness.     Comments: Abdomen nondistended, soft, had noted epigastric tenderness is rated as real right upper quadrant, worse in the epigastric region, there is no guarding or evidence of peritoneal sign negative Murphy sign McBurney point.  Musculoskeletal:     Right lower leg: No edema.     Left lower leg: No edema.     Comments: No calf tenderness no unilateral leg swelling  Skin:    General: Skin is warm and dry.  Neurological:     Mental Status: He is alert.  Psychiatric:        Mood and Affect: Mood normal.     ED Results / Procedures / Treatments   Labs (all labs ordered are listed, but only abnormal results are displayed) Labs Reviewed  CBC  WITH DIFFERENTIAL/PLATELET - Abnormal; Notable for the following components:      Result Value   WBC 11.0 (*)    Neutro Abs 8.1 (*)    All other components within normal limits  COMPREHENSIVE METABOLIC PANEL - Abnormal; Notable for the following components:   Glucose, Bld 278 (*)    AST 14 (*)    All other components within normal limits  LIPASE, BLOOD - Abnormal; Notable for the following components:   Lipase 192 (*)    All other components within normal limits  URINALYSIS, ROUTINE W REFLEX MICROSCOPIC - Abnormal; Notable for the following components:   Glucose, UA >=500 (*)    Protein, ur 30 (*)    Leukocytes,Ua TRACE (*)  Bacteria, UA RARE (*)    All other components within normal limits    EKG EKG Interpretation  Date/Time:  Monday October 26 2021 11:08:24 EDT Ventricular Rate:  77 PR Interval:  182 QRS Duration: 97 QT Interval:  379 QTC Calculation: 429 R Axis:   108 Text Interpretation: Sinus rhythm Anterior infarct, old No significant change since last tracing Confirmed by Margaretmary Eddy [phone]) on 10/26/2021 12:20:01 PM  Radiology US Abdomen Limited  Result Date: 10/26/2021 CLINICAL DATA:  Right upper quadrant abdominal pain. EXAM: ULTRASOUND ABDOMEN LIMITED RIGHT UPPER QUADRANT COMPARISON:  Same day CT of the abdomen and pelvis. FINDINGS: Gallbladder: No gallstones or wall thickening visualized. Multiple gallbladder polyps measure up to 6 mm. No sonographic Murphy sign noted by sonographer. Common bile duct: Diameter: 4 mm Liver: No focal lesion identified. Mild diffuse increase in parenchymal echogenicity. Portal vein is patent on color Doppler imaging with normal direction of blood flow towards the liver. Other: None. IMPRESSION: 1. Mild diffuse increase in parenchymal echogenicity of the liver. This is a nonspecific finding but is most commonly seen with fatty infiltration of the liver. There are no obvious focal liver lesions. 2. Multiple gallbladder polyps measure  up to 6 mm. No further evaluation or follow-up necessary based on size criteria. Per consensus guidelines, this requires no additional evaluation or specific follow-up. This recommendation follows ACR consensus guidelines: White Paper of the ACR Incidental findings Committee II on Gallbladder and Biliary Findings. J Am Coll Radiol 2013:;10:953-956. Electronically Signed   By: Dahlia Bailiff M.D.   On: 10/26/2021 11:22   CT Abdomen Pelvis W Contrast  Result Date: 10/26/2021 CLINICAL DATA:  Abdominal pain, acute, nonlocalized. EXAM: CT ABDOMEN AND PELVIS WITH CONTRAST TECHNIQUE: Multidetector CT imaging of the abdomen and pelvis was performed using the standard protocol following bolus administration of intravenous contrast. RADIATION DOSE REDUCTION: This exam was performed according to the departmental dose-optimization program which includes automated exposure control, adjustment of the mA and/or kV according to patient size and/or use of iterative reconstruction technique. CONTRAST:  60m OMNIPAQUE IOHEXOL 350 MG/ML SOLN COMPARISON:  04/22/2021 FINDINGS: Lower chest: No acute finding at the lung bases. Hepatobiliary: Normal Pancreas: Normal Spleen: Normal Adrenals/Urinary Tract: Adrenal glands are normal. Kidneys are normal. Bladder is normal. Stomach/Bowel: Stomach and small intestine are normal. Normal appendix. Normal colon. Vascular/Lymphatic: Aortic atherosclerosis. No aneurysm. IVC filter in place. No adenopathy. Reproductive: Apparent hydrocele at least on the right. Other: No free fluid or air. Musculoskeletal: Ordinary lower lumbar degenerative changes. IMPRESSION: 1. No abnormality seen to explain acute abdominal pain. 2. Aortic atherosclerosis. 3. IVC filter in place. 4. Apparent hydrocele at least on the right. Electronically Signed   By: MNelson ChimesM.D.   On: 10/26/2021 08:41    Procedures Procedures    Medications Ordered in ED Medications  iohexol (OMNIPAQUE) 350 MG/ML injection 75 mL  (75 mLs Intravenous Contrast Given 10/26/21 0832)  sodium chloride 0.9 % bolus 1,000 mL (0 mLs Intravenous Stopped 10/26/21 1122)  HYDROmorphone (DILAUDID) injection 1 mg (1 mg Intravenous Given 10/26/21 0940)  ondansetron (ZOFRAN) injection 4 mg (4 mg Intravenous Given 10/26/21 0940)    ED Course/ Medical Decision Making/ A&P                           Medical Decision Making Amount and/or Complexity of Data Reviewed Radiology: ordered.  Risk Prescription drug management.   This patient presents to the ED for concern of  epigastric pain, this involves an extensive number of treatment options, and is a complaint that carries with it a high risk of complications and morbidity.  The differential diagnosis includes ACS, pancreatitis, biliary abnormality, diverticulitis, AAA    Additional history obtained:  Additional history obtained from N/A External records from outside source obtained and reviewed including cardiology notes   Co morbidities that complicate the patient evaluation  On anticoag's  Social Determinants of Health:  N/A    Lab Tests:  I Ordered, and personally interpreted labs.  The pertinent results include: CBC shows slight leukocytosis of 11, BMP shows glucose of 278, AST 14, lipase 192, UA shows trace leukocytes white blood cells rare bacteria   Imaging Studies ordered:  I ordered imaging studies including CT chest abdomen pelvis, limited ultrasound I independently visualized and interpreted imaging which showed CT abdomen is negative, ultrasounds negative for acute findings I agree with the radiologist interpretation   Cardiac Monitoring:  The patient was maintained on a cardiac monitor.  I personally viewed and interpreted the cardiac monitored which showed an underlying rhythm of: Without signs of ischemia   Medicines ordered and prescription drug management:  I ordered medication including fluids, pain medications, antiemetics I have reviewed the  patients home medicines and have made adjustments as needed  Critical Interventions:  N/A   Reevaluation:  With abdominal pain, triage obtain lab work and imaging which I personally reviewed, remarkable for elevated lipase, without any intra-abdominal abnormality seen on CT imaging presentation seems most consistent with pancreatitis, but due to his right upper quadrant tenderness will obtain limited ultrasound for rule out of cholelithiasis.  We will also obtain EKG for rule out of atypical ACS  Patient was reassessed states he is feeling better he is agreement for plan discharge at this time.    Consultations Obtained:  N/A    Test Considered:  N/A    Rule out low suspicion for lower lobe pneumonia as lung sounds are clear bilaterally, will defer imaging at this time.  I have low suspicion for liver or gallbladder abnormality, liver enzymes, alk phos, T bili all within normal limits ultrasound is reassuring.   Low suspicion for ruptured stomach ulcer as she has no peritoneal sign present on exam.  Low suspicion for bowel obstruction as abdomen is nondistended normal bowel sounds, so passing gas and having normal bowel movements.  Low suspicion for complicated diverticulitis as she is nontoxic-appearing, vital signs reassuring no leukocytosis present.  Suspicion for volvulus, bowel obstruction, intra-abdominal infection, AAA is all at this time CT imaging is all negative these findings.    Dispostion and problem list  After consideration of the diagnostic results and the patients response to treatment, I feel that the patent would benefit from discharge.  Pancreatitis-likely cause of patient's pain, suspect this is idiopathic, we will have him follow-up with PCP/GI for further evaluation and strict return precautions.            Final Clinical Impression(s) / ED Diagnoses Final diagnoses:  Acute pancreatitis without infection or necrosis, unspecified pancreatitis  type    Rx / DC Orders ED Discharge Orders          Ordered    ondansetron (ZOFRAN) 4 MG tablet  Every 6 hours        10/26/21 1222              Aron Baba 10/26/21 1224    Fransico Meadow, MD 10/26/21 [phone]

## 2021-10-26 NOTE — ED Notes (Signed)
Patient was getting ready to eat prior to med admin.  Requested patient hold off until all testing is completed

## 2021-10-26 NOTE — ED Notes (Signed)
Patient verbalizes understanding of discharge instructions. Opportunity for questioning and answers were provided. Pt discharged from ED. 

## 2021-11-27 ENCOUNTER — Ambulatory Visit: Payer: BC Managed Care – PPO | Admitting: Internal Medicine

## 2021-12-14 ENCOUNTER — Other Ambulatory Visit (HOSPITAL_COMMUNITY): Payer: Self-pay

## 2022-01-11 ENCOUNTER — Ambulatory Visit: Payer: BC Managed Care – PPO | Attending: Internal Medicine | Admitting: Internal Medicine

## 2022-01-11 ENCOUNTER — Encounter: Payer: Self-pay | Admitting: Internal Medicine

## 2022-01-11 VITALS — BP 144/90 | HR 94 | Temp 97.7°F | Ht 70.0 in | Wt 249.0 lb

## 2022-01-11 DIAGNOSIS — I152 Hypertension secondary to endocrine disorders: Secondary | ICD-10-CM

## 2022-01-11 DIAGNOSIS — I2510 Atherosclerotic heart disease of native coronary artery without angina pectoris: Secondary | ICD-10-CM

## 2022-01-11 DIAGNOSIS — H539 Unspecified visual disturbance: Secondary | ICD-10-CM | POA: Diagnosis not present

## 2022-01-11 DIAGNOSIS — Z6835 Body mass index (BMI) 35.0-35.9, adult: Secondary | ICD-10-CM

## 2022-01-11 DIAGNOSIS — F17210 Nicotine dependence, cigarettes, uncomplicated: Secondary | ICD-10-CM

## 2022-01-11 DIAGNOSIS — Z23 Encounter for immunization: Secondary | ICD-10-CM | POA: Diagnosis not present

## 2022-01-11 DIAGNOSIS — E669 Obesity, unspecified: Secondary | ICD-10-CM | POA: Diagnosis not present

## 2022-01-11 DIAGNOSIS — Z1211 Encounter for screening for malignant neoplasm of colon: Secondary | ICD-10-CM

## 2022-01-11 DIAGNOSIS — Z7984 Long term (current) use of oral hypoglycemic drugs: Secondary | ICD-10-CM | POA: Diagnosis not present

## 2022-01-11 DIAGNOSIS — F17200 Nicotine dependence, unspecified, uncomplicated: Secondary | ICD-10-CM | POA: Diagnosis not present

## 2022-01-11 DIAGNOSIS — Z86711 Personal history of pulmonary embolism: Secondary | ICD-10-CM

## 2022-01-11 DIAGNOSIS — E1159 Type 2 diabetes mellitus with other circulatory complications: Secondary | ICD-10-CM | POA: Diagnosis not present

## 2022-01-11 DIAGNOSIS — E1169 Type 2 diabetes mellitus with other specified complication: Secondary | ICD-10-CM | POA: Diagnosis not present

## 2022-01-11 LAB — POCT GLYCOSYLATED HEMOGLOBIN (HGB A1C): HbA1c, POC (controlled diabetic range): 12.8 % — AB (ref 0.0–7.0)

## 2022-01-11 LAB — GLUCOSE, POCT (MANUAL RESULT ENTRY): POC Glucose: 340 mg/dl — AB (ref 70–99)

## 2022-01-11 MED ORDER — METFORMIN HCL ER 500 MG PO TB24: 500.0000 mg | ORAL_TABLET | Freq: Two times a day (BID) | ORAL | 1 refills | Status: DC

## 2022-01-11 MED ORDER — ATORVASTATIN CALCIUM 40 MG PO TABS: 40.0000 mg | ORAL_TABLET | Freq: Every day | ORAL | 1 refills | Status: DC

## 2022-01-11 MED ORDER — INSULIN DETEMIR 100 UNIT/ML FLEXPEN: 25.0000 [IU] | PEN_INJECTOR | Freq: Every day | SUBCUTANEOUS | 5 refills | Status: DC

## 2022-01-11 MED ORDER — CARVEDILOL 3.125 MG PO TABS: 3.1250 mg | ORAL_TABLET | Freq: Two times a day (BID) | ORAL | 1 refills | Status: DC

## 2022-01-11 MED ORDER — FREESTYLE LIBRE 3 SENSOR MISC: 6 refills | Status: DC

## 2022-01-11 MED ORDER — INSULIN PEN NEEDLE 32G X 4 MM MISC: 1.0000 | Freq: Every day | 0 refills | Status: DC

## 2022-01-11 NOTE — Patient Instructions (Addendum)
Restart Levemir insulin at 25 units daily at bedtime. Restart metformin at 500 mg twice a day.  Restart atorvastatin for cholesterol.  We have restarted it at 40 mg instead of 80 mg. Your blood pressure is not at goal.  We have placed you on a new blood pressure medicine that is also a heart medicine called carvedilol 3.125 mg twice a day.  I have sent a prescription to your pharmacy for the continuous glucose monitoring device called the [place] 3.

## 2022-01-11 NOTE — Progress Notes (Signed)
Patient ID: Greg Quinn, male    DOB: 1970/11/17  MRN: [id number]  CC: Follow-up (Experiencing episodes of misreading orders at work X2 weeks./Yes to flu vax)   Subjective: Greg Quinn is a 51 y.o. 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   DM: Results for orders placed or performed in visit on 01/11/22  POCT glucose (manual entry)  Result Value Ref Range   POC Glucose 340 (A) 70 - 99 mg/dl  POCT glycosylated hemoglobin (Hb A1C)  Result Value Ref Range   Hemoglobin A1C     HbA1c POC (<> result, manual entry)     HbA1c, POC (prediabetic range)     HbA1c, POC (controlled diabetic range) 12.8 (A) 0.0 - 7.0 %   Meds:  was on Levemir 35 units daily and Metformin XR 750 mg BID.  Stopped taking all of his medications including his diabetic, hypertension and heart medications because he felt he did not need them anymore.  He states that he is eating better and feels more energetic.  Does a lot of walking at work, does some light calisthenic exercises at home 3 days a week.  He has lost about 20 pounds since April of this year. Checks BS QOD; not over 170 Only heart medication he is taking is Bayer ASA.  Reports Toprol because angry mood swings for him.  He denies any chest pains or shortness of breath.  No swelling in the legs.  He also stopped the Xarelto.  He has history of 2 PEs and 1 DVT. Smoking off and on for past 3 mths.  1 pk last 3 days  Has problems misreading orders at work.  Works at a [employer].  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  Could read the order, but if the brick that he is suppose to pick out and stack has the same color as another, he may pick up a different  product than what is on the order sheet.  Does not think he is color blind  HM:  hx of +cologuard test.  Agrees to c-scope, flu and shingrix Patient Active Problem List   Diagnosis Date Noted   Former smoker 05/09/2021   Positive colorectal cancer screening using Cologuard test 05/09/2021   Obesity (BMI 30-39.9) 04/29/2021   Right foot pain 04/29/2021   Sepsis ([place]) 04/23/2021   CAD S/P percutaneous coronary angioplasty 04/23/2021   Scrotal infection 04/23/2021   Tobacco abuse 04/23/2021   DM (diabetes mellitus), type 2 with complications ([place]) 99/83/3825   Hypertension 08/20/2020   Hyperlipidemia LDL goal <70 08/20/2020   DVT, lower extremity ([place]) 08/20/2020   Current every day smoker 08/20/2020   Bilateral pulmonary embolism ([place]) 08/20/2020   Unstable angina ([place]) 08/20/2020     Current Outpatient Medications on File Prior to Visit  Medication Sig Dispense Refill   Accu-Chek Softclix Lancets lancets Use up to 4 times daily as directed 100 each 0   albuterol (VENTOLIN HFA) 108 (90 Base) MCG/ACT inhaler Inhale 1-2 puffs into the lungs 4 (four) times daily as needed for wheezing or shortness of breath. 6.7 g 0   Ascorbic Acid (VITAMIN C) 1000 MG tablet Take 1,000 mg by mouth 2 (two) times  daily.     Blood Glucose Monitoring Suppl (BLOOD GLUCOSE MONITOR SYSTEM) w/Device KIT Use as directed 1 kit 0   glucose blood test strip Use up to 4 times daily 100 each 0   Multiple Vitamin (MULTIVITAMIN WITH MINERALS) TABS tablet Take 1 tablet by mouth daily.     nitroGLYCERIN (NITROSTAT) 0.4 MG SL tablet Place 1 tablet (0.4 mg total) under the tongue every 5 (five) minutes as needed for chest pain. (Patient not taking: Reported on 10/26/2021) 20 tablet 1   No current facility-administered medications on file prior to visit.    Allergies  Allergen Reactions   Heparin Other (See Comments)    Possible HIT, patient spent a week in the ICU.   Pork-Derived Products Anaphylaxis    Social  History   Socioeconomic History   Marital status: Single    Spouse name: Not on file   Number of children: Not on file   Years of education: Not on file   Highest education level: Not on file  Occupational History   Not on file  Tobacco Use   Smoking status: Former    Packs/day: 1.00    Types: Cigarettes    Quit date: 04/22/2021    Years since quitting: 0.7   Smokeless tobacco: Never  Substance and Sexual Activity   Alcohol use: Yes    Comment: occasional   Drug use: Never   Sexual activity: Not on file  Other Topics Concern   Not on file  Social History Narrative   Not on file   Social Determinants of Health   Financial Resource Strain: Not on file  Food Insecurity: Not on file  Transportation Needs: Not on file  Physical Activity: Not on file  Stress: Not on file  Social Connections: Not on file  Intimate Partner Violence: Not on file    Family History  Problem Relation Age of Onset   Diabetes Father     Past Surgical History:  Procedure Laterality Date   CORONARY STENT INTERVENTION N/A 08/20/2020   Procedure: CORONARY STENT INTERVENTION;  Surgeon: Jettie Booze, MD;  Location: [facility];  Service: Cardiovascular;  Laterality: N/A;   CYSTOSCOPY N/A 04/23/2021   Procedure: CYSTOSCOPY;  Surgeon: Bjorn Loser, MD;  Location: [facility];  Service: Urology;  Laterality: N/A;   INCISION AND DRAINAGE ABSCESS N/A 04/23/2021   Procedure: INCISION AND DRAINAGE ABSCESS;  Surgeon: Bjorn Loser, MD;  Location: [facility];  Service: Urology;  Laterality: N/A;   INTRAVASCULAR ULTRASOUND/IVUS N/A 08/20/2020   Procedure: Intravascular Ultrasound/IVUS;  Surgeon: Jettie Booze, MD;  Location: [facility];  Service: Cardiovascular;  Laterality: N/A;   LEFT HEART CATH AND CORONARY ANGIOGRAPHY N/A 08/20/2020   Procedure: LEFT HEART CATH AND CORONARY ANGIOGRAPHY;  Surgeon: Jettie Booze, MD;  Location: [facility];  Service: Cardiovascular;   Laterality: N/A;    ROS: Review of Systems Negative except as stated above  PHYSICAL EXAM: BP (!) 144/90 (BP Location: Left Arm, Patient Position: Sitting, Cuff Size: Large)   Pulse 94   Temp 97.7 F (36.5 C) (Oral)   Ht _0  (1.778 m)   Wt 249 lb (112.9 kg)   SpO2 99%   BMI 35.73 kg/m   Wt Readings from Last 3 Encounters:  01/11/22 249 lb (112.9 kg)  06/18/21 263 lb (119.3 kg)  05/08/21 269 lb 3.2 oz (122.1 kg)    Physical Exam   General appearance - alert, well appearing, and in no  distress Mental status - normal mood, behavior, speech, dress, motor activity, and thought processes Neck - supple, no significant adenopathy Chest - clear to auscultation, no wheezes, rales or rhonchi, symmetric air entry Heart - normal rate, regular rhythm, normal S1, S2, no murmurs, rubs, clicks or gallops Extremities - peripheral pulses normal, no pedal edema, no clubbing or cyanosis Diabetic Foot Exam - Simple   Simple Foot Form Diabetic Foot exam was performed with the following findings: Yes 01/11/2022  6:18 PM  Visual Inspection See comments: Yes Sensation Testing Intact to touch and monofilament testing bilaterally: Yes Pulse Check Posterior Tibialis and Dorsalis pulse intact bilaterally: Yes Comments Patient is flat-footed.  Several of the nails are discolored at the tips.        Latest Ref Rng & Units 10/25/2021   11:28 PM 10/25/2021   12:05 AM 05/01/2021    3:09 AM  CMP  Glucose 70 - 99 mg/dL 278  338  275   BUN 6 - 20 mg/dL _0 Creatinine 0.61 - 1.24 mg/dL 0.88  0.80  0.81   Sodium 135 - 145 mmol/L 135  134  136   Potassium 3.5 - 5.1 mmol/L 4.3  4.0  4.1   Chloride 98 - 111 mmol/L 101  102  103   CO2 22 - 32 mmol/L _1 Calcium 8.9 - 10.3 mg/dL 9.6  9.3  8.8   Total Protein 6.5 - 8.1 g/dL 7.4  7.1    Total Bilirubin 0.3 - 1.2 mg/dL 0.5  0.5    Alkaline Phos 38 - 126 U/L 121  123    AST 15 - 41 U/L 14  17    ALT 0 - 44 U/L 17  13     Lipid Panel      Component Value Date/Time   CHOL 146 08/21/2020 0207   TRIG 57 08/21/2020 0207   HDL 42 08/21/2020 0207   CHOLHDL 3.5 08/21/2020 0207   VLDL 11 08/21/2020 0207   LDLCALC 93 08/21/2020 0207    CBC    Component Value Date/Time   WBC 11.0 (H) 10/25/2021 2328   RBC 5.30 10/25/2021 2328   HGB 15.6 10/25/2021 2328   HCT 46.5 10/25/2021 2328   PLT 276 10/25/2021 2328   MCV 87.7 10/25/2021 2328   MCH 29.4 10/25/2021 2328   MCHC 33.5 10/25/2021 2328   RDW 12.4 10/25/2021 2328   LYMPHSABS 1.9 10/25/2021 2328   MONOABS 0.8 10/25/2021 2328   EOSABS 0.1 10/25/2021 2328   BASOSABS 0.0 10/25/2021 2328    ASSESSMENT AND PLAN: 1. Type 2 diabetes mellitus with obesity ([place]) Not at goal and significantly uncontrolled based on his A1c. Discussed the importance of healthy eating habits, regular aerobic exercise (at least 150 minutes a week as tolerated) and medication compliance to achieve or maintain control of diabetes. I recommend getting back on Levemir insulin and metformin.  I suspect the weight loss that he has had is due to uncontrolled diabetes. He is wanting to be restarted on a lower dose of the Levemir and metformin.  We will start him on Levemir 25 units daily and metformin 500 mg twice a day. Discussed a continuous glucose monitor.  He is interested in getting 1.  Prescription sent to his pharmacy for the [place] device.  Advised to have the pharmacist show him how to set it up and download the app to his phone. - POCT glucose (manual entry) -  POCT glycosylated hemoglobin (Hb A1C) - Ambulatory referral to Ophthalmology - insulin detemir (LEVEMIR) 100 UNIT/ML FlexPen; Inject 25 Units into the skin daily.  Dispense: 15 mL; Refill: 5 - Insulin Pen Needle 32G X 4 MM MISC; use as directed daily with insulin  Dispense: 100 each; Refill: 0 - metFORMIN (GLUCOPHAGE-XR) 500 MG 24 hr tablet; Take 1 tablet (500 mg total) by mouth 2 (two) times daily.  Dispense: 180 tablet; Refill: 1 -  Continuous Blood Gluc Sensor (FREESTYLE LIBRE 3 SENSOR) MISC; Place 1 sensor on the skin every 14 days. Use to check glucose continuously  Dispense: 2 each; Refill: 6  2. Hypertension associated with diabetes ([place]) Not at goal.  He has discontinued taking metoprolol and lisinopril.  Reports Toprol caused bad moods.  We will put him on carvedilol instead. - carvedilol (COREG) 3.125 MG tablet; Take 1 tablet (3.125 mg total) by mouth 2 (two) times daily with a meal.  Dispense: 180 tablet; Refill: 1  3. Coronary artery disease involving native coronary artery of native heart without angina pectoris Restart atorvastatin.  Change Toprol to carvedilol.  Continue aspirin daily. - atorvastatin (LIPITOR) 40 MG tablet; Take 1 tablet (40 mg total) by mouth daily.  Dispense: 90 tablet; Refill: 1 - carvedilol (COREG) 3.125 MG tablet; Take 1 tablet (3.125 mg total) by mouth 2 (two) times daily with a meal.  Dispense: 180 tablet; Refill: 1  4. Tobacco use disorder Strongly advised him to quit given his history of coronary artery disease.  5. Difficulty reading due to visual problem - Ambulatory referral to Ophthalmology  6. History of pulmonary embolism Discussed getting him back on Xarelto given history of 2 PEs and 1 DVT in the past.  Needs to be on lifelong anticoagulation.  Patient declines being restarted on blood thinner  7. Need for shingles vaccine First shingles vaccine given today.  8. Screening for colon cancer Patient agreeable to referral for colonoscopy - Ambulatory referral to Gastroenterology  9. Need for immunization against influenza - Flu Vaccine QUAD 86moIM (Fluarix, Fluzone & Alfiuria Quad PF)    Patient was given the opportunity to ask questions.  Patient verbalized understanding of the plan and was able to repeat key elements of the plan.   This documentation was completed using [employer] producer  Any transcriptional errors are unintentional.  Orders  Placed This Encounter  Procedures   Varicella-zoster vaccine IM   Flu Vaccine QUAD 616moM (Fluarix, Fluzone & Alfiuria Quad PF)   Ambulatory referral to Gastroenterology   Ambulatory referral to Ophthalmology   POCT glucose (manual entry)   POCT glycosylated hemoglobin (Hb A1C)     Requested Prescriptions   Signed Prescriptions Disp Refills   atorvastatin (LIPITOR) 40 MG tablet 90 tablet 1    Sig: Take 1 tablet (40 mg total) by mouth daily.   insulin detemir (LEVEMIR) 100 UNIT/ML FlexPen 15 mL 5    Sig: Inject 25 Units into the skin daily.   Insulin Pen Needle 32G X 4 MM MISC 100 each 0    Sig: use as directed daily with insulin   metFORMIN (GLUCOPHAGE-XR) 500 MG 24 hr tablet 180 tablet 1    Sig: Take 1 tablet (500 mg total) by mouth 2 (two) times daily.   carvedilol (COREG) 3.125 MG tablet 180 tablet 1    Sig: Take 1 tablet (3.125 mg total) by mouth 2 (two) times daily with a meal.   Continuous Blood Gluc Sensor (FREESTYLE LIBRE 3 SENSOR) MISC 2 each  6    Sig: Place 1 sensor on the skin every 14 days. Use to check glucose continuously    Return in about 4 months (around 05/13/2022) for Appt with [facility] in 4 wks for diabetes check.  Karle Plumber, MD, FACP

## 2022-02-22 ENCOUNTER — Ambulatory Visit: Payer: Self-pay | Attending: Pharmacist | Admitting: Pharmacist

## 2022-05-14 ENCOUNTER — Ambulatory Visit: Payer: Self-pay | Admitting: Internal Medicine

## 2022-05-19 ENCOUNTER — Other Ambulatory Visit: Payer: Self-pay | Admitting: Internal Medicine

## 2022-05-19 DIAGNOSIS — I2510 Atherosclerotic heart disease of native coronary artery without angina pectoris: Secondary | ICD-10-CM

## 2022-05-19 DIAGNOSIS — Z86711 Personal history of pulmonary embolism: Secondary | ICD-10-CM

## 2022-05-20 NOTE — Telephone Encounter (Signed)
Unable to refill per protocol, Rx expired. Discontinued 01/11/22.  Requested Prescriptions  Pending Prescriptions Disp Refills   clopidogrel (PLAVIX) 75 MG tablet [Pharmacy Med Name: Clopidogrel Bisulfate 75 MG Oral Tablet] 30 tablet 0    Sig: Take 1 tablet by mouth once daily     Hematology: Antiplatelets - clopidogrel Failed - 05/19/2022  5:55 PM      Failed - HCT in normal range and within 180 days    HCT  Date Value Ref Range Status  10/25/2021 46.5 39.0 - 52.0 % Final         Failed - HGB in normal range and within 180 days    Hemoglobin  Date Value Ref Range Status  10/25/2021 15.6 13.0 - 17.0 g/dL Final         Failed - PLT in normal range and within 180 days    Platelets  Date Value Ref Range Status  10/25/2021 276 150 - 400 K/uL Final         Passed - Cr in normal range and within 360 days    Creatinine, Ser  Date Value Ref Range Status  10/25/2021 0.88 0.61 - 1.24 mg/dL Final         Passed - Valid encounter within last 6 months    Recent Outpatient Visits           4 months ago Type 2 diabetes mellitus with obesity (HCC)   [place] [facility] [name], MD   1 year ago Fournier's gangrene of scrotum   [facility] [name], MD       Future Appointments             Tomorrow [name], MD [facility]             XARELTO 20 MG TABS tablet [Pharmacy Med Name: Xarelto 20 MG Oral Tablet] 30 tablet 0    Sig: Take 1 tablet by mouth once daily     Hematology: Anticoagulants - rivaroxaban Failed - 05/19/2022  5:55 PM      Failed - AST in normal range and within 360 days    AST  Date Value Ref Range Status  10/25/2021 14 (L) 15 - 41 U/L Final         Passed - ALT in normal range and within 360 days    ALT  Date Value Ref Range Status  10/25/2021 17 0 - 44 U/L Final         Passed - Cr in normal range and within 360 days     Creatinine, Ser  Date Value Ref Range Status  10/25/2021 0.88 0.61 - 1.24 mg/dL Final         Passed - HCT in normal range and within 360 days    HCT  Date Value Ref Range Status  10/25/2021 46.5 39.0 - 52.0 % Final         Passed - HGB in normal range and within 360 days    Hemoglobin  Date Value Ref Range Status  10/25/2021 15.6 13.0 - 17.0 g/dL Final         Passed - PLT in normal range and within 360 days    Platelets  Date Value Ref Range Status  10/25/2021 276 150 - 400 K/uL Final         Passed - eGFR is 15 or above and within 360 days  GFR, Estimated  Date Value Ref Range Status  10/25/2021 >60 >60 mL/min Final    Comment:    (NOTE) Calculated using the CKD-EPI Creatinine Equation (2021)          Passed - Patient is not pregnant      Passed - Valid encounter within last 12 months    Recent Outpatient Visits           4 months ago Type 2 diabetes mellitus with obesity [facility])   [place] [facility] [name], MD   1 year ago Fournier's gangrene of scrotum   [place] [facility] [name], MD       Future Appointments             Tomorrow [name], MD [facility]

## 2022-05-21 ENCOUNTER — Other Ambulatory Visit: Payer: Self-pay

## 2022-05-21 ENCOUNTER — Ambulatory Visit: Payer: 59 | Attending: Internal Medicine | Admitting: Internal Medicine

## 2022-05-21 ENCOUNTER — Encounter: Payer: Self-pay | Admitting: Internal Medicine

## 2022-05-21 VITALS — BP 143/85 | HR 100 | Temp 98.2°F | Ht 70.0 in | Wt 259.0 lb

## 2022-05-21 DIAGNOSIS — Z596 Low income: Secondary | ICD-10-CM

## 2022-05-21 DIAGNOSIS — L02429 Furuncle of limb, unspecified: Secondary | ICD-10-CM

## 2022-05-21 DIAGNOSIS — E1159 Type 2 diabetes mellitus with other circulatory complications: Secondary | ICD-10-CM

## 2022-05-21 DIAGNOSIS — I152 Hypertension secondary to endocrine disorders: Secondary | ICD-10-CM | POA: Diagnosis not present

## 2022-05-21 DIAGNOSIS — Z794 Long term (current) use of insulin: Secondary | ICD-10-CM | POA: Diagnosis not present

## 2022-05-21 DIAGNOSIS — F17200 Nicotine dependence, unspecified, uncomplicated: Secondary | ICD-10-CM

## 2022-05-21 DIAGNOSIS — I2510 Atherosclerotic heart disease of native coronary artery without angina pectoris: Secondary | ICD-10-CM

## 2022-05-21 DIAGNOSIS — F17210 Nicotine dependence, cigarettes, uncomplicated: Secondary | ICD-10-CM

## 2022-05-21 DIAGNOSIS — E1165 Type 2 diabetes mellitus with hyperglycemia: Secondary | ICD-10-CM

## 2022-05-21 LAB — POCT URINALYSIS DIP (CLINITEK)
Bilirubin, UA: NEGATIVE
Blood, UA: NEGATIVE
Glucose, UA: 500 mg/dL — AB
Ketones, POC UA: NEGATIVE mg/dL
Leukocytes, UA: NEGATIVE
Nitrite, UA: NEGATIVE
POC PROTEIN,UA: NEGATIVE
Spec Grav, UA: 1.02 (ref 1.010–1.025)
Urobilinogen, UA: 0.2 E.U./dL
pH, UA: 7 (ref 5.0–8.0)

## 2022-05-21 LAB — POCT GLYCOSYLATED HEMOGLOBIN (HGB A1C): HbA1c, POC (controlled diabetic range): 15 % — AB (ref 0.0–7.0)

## 2022-05-21 LAB — GLUCOSE, POCT (MANUAL RESULT ENTRY): POC Glucose: 427 mg/dl — AB (ref 70–99)

## 2022-05-21 MED ORDER — SULFAMETHOXAZOLE-TRIMETHOPRIM 800-160 MG PO TABS
1.0000 | ORAL_TABLET | Freq: Two times a day (BID) | ORAL | 0 refills | Status: DC
Start: 2022-05-21 — End: 2022-07-01
  Filled 2022-05-21: qty 20, 10d supply, fill #0

## 2022-05-21 MED ORDER — PEN NEEDLES 31G X 8 MM MISC
6 refills | Status: DC
Start: 2022-05-21 — End: 2023-02-16
  Filled 2022-05-21: qty 100, 25d supply, fill #0
  Filled 2022-06-27: qty 100, 25d supply, fill #1
  Filled 2022-08-13 – 2022-08-25 (×2): qty 100, 25d supply, fill #2
  Filled 2022-09-20: qty 100, 25d supply, fill #3
  Filled 2022-12-16 (×2): qty 100, 25d supply, fill #4
  Filled 2023-01-22: qty 100, 25d supply, fill #5

## 2022-05-21 MED ORDER — INSULIN ASPART 100 UNIT/ML IJ SOLN
8.0000 [IU] | Freq: Once | INTRAMUSCULAR | Status: AC
Start: 2022-05-21 — End: 2022-05-21
  Administered 2022-05-21: 8 [IU] via SUBCUTANEOUS

## 2022-05-21 MED ORDER — NOVOLOG FLEXPEN 100 UNIT/ML ~~LOC~~ SOPN
5.0000 [IU] | PEN_INJECTOR | Freq: Three times a day (TID) | SUBCUTANEOUS | 11 refills | Status: DC
Start: 2022-05-21 — End: 2023-02-16
  Filled 2022-05-21: qty 6, 40d supply, fill #0
  Filled 2022-06-27: qty 6, 40d supply, fill #1
  Filled 2022-08-13 – 2022-08-25 (×2): qty 6, 40d supply, fill #2
  Filled 2022-09-22 – 2022-09-27 (×2): qty 6, 40d supply, fill #3
  Filled 2022-12-16: qty 6, 40d supply, fill #4
  Filled 2023-01-22: qty 6, 40d supply, fill #5

## 2022-05-21 MED ORDER — CARVEDILOL 3.125 MG PO TABS
3.1250 mg | ORAL_TABLET | Freq: Two times a day (BID) | ORAL | 1 refills | Status: DC
Start: 2022-05-21 — End: 2023-01-22
  Filled 2022-05-21: qty 60, 30d supply, fill #0
  Filled 2022-06-27: qty 60, 30d supply, fill #1
  Filled 2022-08-13: qty 60, 30d supply, fill #2
  Filled 2022-09-20: qty 60, 30d supply, fill #3
  Filled 2022-10-30 – 2022-11-10 (×2): qty 60, 30d supply, fill #4
  Filled 2022-12-16: qty 60, 30d supply, fill #5

## 2022-05-21 MED ORDER — ATORVASTATIN CALCIUM 40 MG PO TABS
40.0000 mg | ORAL_TABLET | Freq: Every day | ORAL | 1 refills | Status: DC
Start: 2022-05-21 — End: 2023-01-22
  Filled 2022-05-21: qty 30, 30d supply, fill #0
  Filled 2022-06-27: qty 30, 30d supply, fill #1
  Filled 2022-08-13: qty 30, 30d supply, fill #2
  Filled 2022-09-20: qty 30, 30d supply, fill #3
  Filled 2022-10-30 – 2022-11-10 (×2): qty 30, 30d supply, fill #4
  Filled 2022-12-16: qty 30, 30d supply, fill #5

## 2022-05-21 MED ORDER — LANTUS SOLOSTAR 100 UNIT/ML ~~LOC~~ SOPN
20.0000 [IU] | PEN_INJECTOR | Freq: Every day | SUBCUTANEOUS | 11 refills | Status: DC
Start: 2022-05-21 — End: 2023-02-16
  Filled 2022-05-21: qty 6, 30d supply, fill #0
  Filled 2022-06-27: qty 6, 30d supply, fill #1
  Filled 2022-08-13 – 2022-08-25 (×2): qty 6, 30d supply, fill #2
  Filled 2022-09-20: qty 6, 30d supply, fill #3
  Filled 2022-12-16: qty 6, 30d supply, fill #4
  Filled 2023-01-22 – 2023-01-24 (×2): qty 6, 30d supply, fill #5

## 2022-05-21 NOTE — Progress Notes (Signed)
Patient ID: Greg Quinn, male    DOB: 11-07-1970  MRN: [id number]  CC: Diabetes (DM f/u. Med refills. /)   Subjective: Greg Quinn is a 52 y.o. 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    HTN/CAD: Patient was last seen in December of last year.  Reports that he has been off all medications for about 2 months due to limited finances.  Months because of all of his medications he states was about $450 which he could not afford and still be able to pay his rent and car note. -He is supposed to be on carvedilol, atorvastatin and aspirin.  He takes the aspirin daily over-the-counter. Denies any shortness of breath or swelling in the legs.  Occasional chest pains on the side of both ribs.  DM: Results for orders placed or performed in visit on 05/21/22  POCT glucose (manual entry)  Result Value Ref Range   POC Glucose 427 (A) 70 - 99 mg/dl  POCT glycosylated hemoglobin (Hb A1C)  Result Value Ref Range   Hemoglobin A1C     HbA1c POC (<> result, manual entry)     HbA1c, POC (prediabetic range)     HbA1c, POC (controlled diabetic range) 15.0 (A) 0.0 - 7.0 %  POCT URINALYSIS DIP (CLINITEK)  Result Value Ref Range   Color, UA yellow yellow   Clarity, UA clear clear   Glucose, UA =500 (A) negative mg/dL   Bilirubin, UA negative negative   Ketones, POC UA negative negative mg/dL   Spec Grav, UA 7.829 5.621 - 1.025   Blood, UA negative negative   pH, UA 7.0 5.0 - 8.0   POC PROTEIN,UA negative negative, trace   Urobilinogen, UA 0.2 0.2 or 1.0 E.U./dL   Nitrite, UA Negative Negative   Leukocytes, UA Negative Negative  On last visit he was restarted on Levemir insulin and metformin.  However he has not been on medications x 2 months for reasons stated above.   States that he has been eating more fruits and vegetables.  Drank a smoothie today before coming in. Endorses polyuria but no polydipsia.  No blurred vision.  Tobacco dependence: He continues to smoke.  States that he has nicotine patches at home which he uses intermittently.  Complains of intermittent boils in the upper inner thighs.  Has 1 on either side today that is draining.  They have been present for about 3 days.  He has history of Fournier's gangrene of the scrotum  Patient Active Problem List   Diagnosis Date Noted   Former smoker 05/09/2021   Positive colorectal cancer screening using Cologuard test 05/09/2021   Obesity (BMI 30-39.9) 04/29/2021   Right foot pain 04/29/2021   Sepsis (HCC) 04/23/2021   CAD S/P percutaneous coronary angioplasty 04/23/2021   Scrotal infection 04/23/2021   Tobacco abuse 04/23/2021   DM (diabetes mellitus), type 2 with complications (HCC) 08/20/2020   Hypertension 08/20/2020   Hyperlipidemia LDL goal <70 08/20/2020   DVT, lower extremity (HCC) 08/20/2020   Current every day smoker 08/20/2020   Bilateral pulmonary embolism (HCC) 08/20/2020   Unstable angina (HCC) 08/20/2020     Current Outpatient Medications on File Prior to Visit  Medication Sig Dispense Refill   Accu-Chek Softclix Lancets lancets Use up to 4 times daily  as directed 100 each 0   albuterol (VENTOLIN HFA) 108 (90 Base) MCG/ACT inhaler Inhale 1-2 puffs into the lungs 4 (four) times daily as needed for wheezing or shortness of breath. 6.7 g 0   Ascorbic Acid (VITAMIN C) 1000 MG tablet Take 1,000 mg by mouth 2 (two) times daily.     Blood Glucose Monitoring Suppl (BLOOD GLUCOSE MONITOR SYSTEM) w/Device KIT Use as directed 1 kit 0   glucose blood test strip Use up to 4 times daily 100 each 0   Multiple Vitamin (MULTIVITAMIN WITH MINERALS) TABS tablet Take 1 tablet by mouth daily.     Continuous Blood Gluc Sensor (FREESTYLE LIBRE 3 SENSOR) MISC Place 1 sensor on the skin every 14  days. Use to check glucose continuously (Patient not taking: Reported on 05/21/2022) 2 each 6   nitroGLYCERIN (NITROSTAT) 0.4 MG SL tablet Place 1 tablet (0.4 mg total) under the tongue every 5 (five) minutes as needed for chest pain. (Patient not taking: Reported on 10/26/2021) 20 tablet 1   No current facility-administered medications on file prior to visit.    Allergies  Allergen Reactions   Heparin Other (See Comments)    Possible HIT, patient spent a week in the ICU.   Pork-Derived Products Anaphylaxis    Social History   Socioeconomic History   Marital status: Single    Spouse name: Not on file   Number of children: Not on file   Years of education: Not on file   Highest education level: Not on file  Occupational History   Not on file  Tobacco Use   Smoking status: Former    Packs/day: 1    Types: Cigarettes    Quit date: 04/22/2021    Years since quitting: 1.0   Smokeless tobacco: Never  Substance and Sexual Activity   Alcohol use: Yes    Comment: occasional   Drug use: Never   Sexual activity: Not on file  Other Topics Concern   Not on file  Social History Narrative   Not on file   Social Determinants of Health   Financial Resource Strain: Not on file  Food Insecurity: Not on file  Transportation Needs: Not on file  Physical Activity: Not on file  Stress: Not on file  Social Connections: Not on file  Intimate Partner Violence: Not on file    Family History  Problem Relation Age of Onset   Diabetes Father     Past Surgical History:  Procedure Laterality Date   CORONARY STENT INTERVENTION N/A 08/20/2020   Procedure: CORONARY STENT INTERVENTION;  Surgeon: Corky Crafts, MD;  Location: [facility] INVASIVE CV LAB;  Service: Cardiovascular;  Laterality: N/A;   CORONARY ULTRASOUND/IVUS N/A 08/20/2020   Procedure: Intravascular Ultrasound/IVUS;  Surgeon: Corky Crafts, MD;  Location: [facility] INVASIVE CV LAB;  Service: Cardiovascular;  Laterality: N/A;    CYSTOSCOPY N/A 04/23/2021   Procedure: CYSTOSCOPY;  Surgeon: Alfredo Martinez, MD;  Location: [facility] OR;  Service: Urology;  Laterality: N/A;   INCISION AND DRAINAGE ABSCESS N/A 04/23/2021   Procedure: INCISION AND DRAINAGE ABSCESS;  Surgeon: Alfredo Martinez, MD;  Location: [facility] OR;  Service: Urology;  Laterality: N/A;   LEFT HEART CATH AND CORONARY ANGIOGRAPHY N/A 08/20/2020   Procedure: LEFT HEART CATH AND CORONARY ANGIOGRAPHY;  Surgeon: Corky Crafts, MD;  Location: [facility] INVASIVE CV LAB;  Service: Cardiovascular;  Laterality: N/A;    ROS: Review of Systems Negative except as stated above  PHYSICAL EXAM: BP (!) 143/85 (BP Location:  Left Arm, Patient Position: Sitting, Cuff Size: Large)   Pulse 100   Temp 98.2 F (36.8 C) (Oral)   Ht 5\' 10"  (1.778 m)   Wt 259 lb (117.5 kg)   SpO2 98%   BMI 37.16 kg/m   Physical Exam   General appearance - alert, well appearing, and in no distress Mental status - normal mood, behavior, speech, dress, motor activity, and thought processes Chest - clear to auscultation, no wheezes, rales or rhonchi, symmetric air entry Heart - normal rate, regular rhythm, normal S1, S2, no murmurs, rubs, clicks or gallops Extremities - peripheral pulses normal, no pedal edema, no clubbing or cyanosis Skin -small area of induration without fluctuance of the left upper inner thigh close to the groin area.  Small amount of pus draining from the center.  Area tender to touch.  On the right upper inner thigh, he has a smaller area of induration but no pus drainage.     Latest Ref Rng & Units 10/25/2021   11:28 PM 10/25/2021   12:05 AM 05/01/2021    3:09 AM  CMP  Glucose 70 - 99 mg/dL 409  811  914   BUN 6 - 20 mg/dL 11  6  9    Creatinine 0.61 - 1.24 mg/dL 7.82  9.56  2.13   Sodium 135 - 145 mmol/L 135  134  136   Potassium 3.5 - 5.1 mmol/L 4.3  4.0  4.1   Chloride 98 - 111 mmol/L 101  102  103   CO2 22 - 32 mmol/L 23  23  28    Calcium 8.9 - 10.3 mg/dL 9.6  9.3  8.8    Total Protein 6.5 - 8.1 g/dL 7.4  7.1    Total Bilirubin 0.3 - 1.2 mg/dL 0.5  0.5    Alkaline Phos 38 - 126 U/L 121  123    AST 15 - 41 U/L 14  17    ALT 0 - 44 U/L 17  13     Lipid Panel     Component Value Date/Time   CHOL 146 08/21/2020 0207   TRIG 57 08/21/2020 0207   HDL 42 08/21/2020 0207   CHOLHDL 3.5 08/21/2020 0207   VLDL 11 08/21/2020 0207   LDLCALC 93 08/21/2020 0207    CBC    Component Value Date/Time   WBC 11.0 (H) 10/25/2021 2328   RBC 5.30 10/25/2021 2328   HGB 15.6 10/25/2021 2328   HCT 46.5 10/25/2021 2328   PLT 276 10/25/2021 2328   MCV 87.7 10/25/2021 2328   MCH 29.4 10/25/2021 2328   MCHC 33.5 10/25/2021 2328   RDW 12.4 10/25/2021 2328   LYMPHSABS 1.9 10/25/2021 2328   MONOABS 0.8 10/25/2021 2328   EOSABS 0.1 10/25/2021 2328   BASOSABS 0.0 10/25/2021 2328    ASSESSMENT AND PLAN:  1. Type 2 diabetes mellitus with hyperglycemia, with long-term current use of insulin (HCC) Uncontrolled due to not being able to afford medications.  He has recently changed insurance and thinks that his co-pay for medications will be a lot better. Given his A1c, I recommend basal insulin with Lantus 20 units at bedtime and NovoLog 5 units with meals.  We will hold off on metformin for now until next visit.  Went over signs and symptoms of hypoglycemia and how to treat.  Went over blood sugar goals before meals being 90-130.  Advised to check blood sugars at least twice a day before meals and bring readings on next visit.  Discussed and encourage healthy eating habits. Lab is closed already for the evening.  Advised him to come back on Monday as a lab only visit to have chemistry done.  He promises to do so. - POCT glucose (manual entry) - POCT glycosylated hemoglobin (Hb A1C) - POCT URINALYSIS DIP (CLINITEK) - insulin glargine (LANTUS SOLOSTAR) 100 UNIT/ML Solostar Pen; Inject 20 Units into the skin at bedtime.  Dispense: 15 mL; Refill: 11 - insulin aspart (NOVOLOG  FLEXPEN) 100 UNIT/ML FlexPen; Inject 5 Units into the skin 3 (three) times daily with meals.  Dispense: 15 mL; Refill: 11 - Comprehensive metabolic panel; Future - Insulin Pen Needle (PEN NEEDLES) 31G X 8 MM MISC; use as directed  Dispense: 100 each; Refill: 6 - insulin aspart (novoLOG) injection 8 Units  2. Coronary artery disease involving native coronary artery of native heart without angina pectoris Stable.  Restart atorvastatin and carvedilol.  Continue baby aspirin daily which he takes from over-the-counter - atorvastatin (LIPITOR) 40 MG tablet; Take 1 tablet (40 mg total) by mouth daily.  Dispense: 90 tablet; Refill: 1 - carvedilol (COREG) 3.125 MG tablet; Take 1 tablet (3.125 mg total) by mouth 2 (two) times daily with a meal.  Dispense: 180 tablet; Refill: 1  3. Hypertension associated with diabetes (HCC) Not at goal.  Restart carvedilol. - carvedilol (COREG) 3.125 MG tablet; Take 1 tablet (3.125 mg total) by mouth 2 (two) times daily with a meal.  Dispense: 180 tablet; Refill: 1  4. Boil, thigh Discussed the importance of good diabetes control to help prevent recurrent boils. Will put him on Bactrim for 10 days.  Advised that if this progresses or does not improve with the antibiotics, he should be seen in the emergency room for I&D - sulfamethoxazole-trimethoprim (BACTRIM DS) 800-160 MG tablet; Take 1 tablet by mouth 2 (two) times daily.  Dispense: 20 tablet; Refill: 0  5. Tobacco use disorder Strongly advised to quit smoking.  Patient was given the opportunity to ask questions.  Patient verbalized understanding of the plan and was able to repeat key elements of the plan.   This documentation was completed using Paediatric nurse.  Any transcriptional errors are unintentional.  Orders Placed This Encounter  Procedures   Comprehensive metabolic panel   POCT glucose (manual entry)   POCT glycosylated hemoglobin (Hb A1C)   POCT URINALYSIS DIP (CLINITEK)      Requested Prescriptions   Signed Prescriptions Disp Refills   insulin glargine (LANTUS SOLOSTAR) 100 UNIT/ML Solostar Pen 15 mL 11    Sig: Inject 20 Units into the skin at bedtime.   insulin aspart (NOVOLOG FLEXPEN) 100 UNIT/ML FlexPen 15 mL 11    Sig: Inject 5 Units into the skin 3 (three) times daily with meals.   sulfamethoxazole-trimethoprim (BACTRIM DS) 800-160 MG tablet 20 tablet 0    Sig: Take 1 tablet by mouth 2 (two) times daily.   atorvastatin (LIPITOR) 40 MG tablet 90 tablet 1    Sig: Take 1 tablet (40 mg total) by mouth daily.   carvedilol (COREG) 3.125 MG tablet 180 tablet 1    Sig: Take 1 tablet (3.125 mg total) by mouth 2 (two) times daily with a meal.   Insulin Pen Needle (PEN NEEDLES) 31G X 8 MM MISC 100 each 6    Sig: use as directed    Return in about 1 month (around 06/20/2022).  Jonah Blue, MD, FACP

## 2022-05-26 ENCOUNTER — Other Ambulatory Visit: Payer: Self-pay | Admitting: Internal Medicine

## 2022-05-26 ENCOUNTER — Ambulatory Visit: Payer: 59 | Attending: Internal Medicine

## 2022-05-27 LAB — COMPREHENSIVE METABOLIC PANEL
ALT: 24 IU/L (ref 0–44)
AST: 21 IU/L (ref 0–40)
Albumin/Globulin Ratio: 1.4 (ref 1.2–2.2)
Albumin: 4.2 g/dL (ref 3.8–4.9)
Alkaline Phosphatase: 148 IU/L — ABNORMAL HIGH (ref 44–121)
BUN/Creatinine Ratio: 8 — ABNORMAL LOW (ref 9–20)
BUN: 7 mg/dL (ref 6–24)
Bilirubin Total: 0.4 mg/dL (ref 0.0–1.2)
CO2: 17 mmol/L — ABNORMAL LOW (ref 20–29)
Calcium: 9.3 mg/dL (ref 8.7–10.2)
Chloride: 103 mmol/L (ref 96–106)
Creatinine, Ser: 0.9 mg/dL (ref 0.76–1.27)
Globulin, Total: 3 g/dL (ref 1.5–4.5)
Glucose: 258 mg/dL — ABNORMAL HIGH (ref 70–99)
Potassium: 4.5 mmol/L (ref 3.5–5.2)
Sodium: 137 mmol/L (ref 134–144)
Total Protein: 7.2 g/dL (ref 6.0–8.5)
eGFR: 102 mL/min/{1.73_m2} (ref >59)

## 2022-06-25 ENCOUNTER — Emergency Department (HOSPITAL_COMMUNITY)
Admission: EM | Admit: 2022-06-25 | Discharge: 2022-06-25 | Disposition: A | Discharge status: Home / Self Care | Payer: 59 | Attending: Emergency Medicine | Admitting: Emergency Medicine

## 2022-06-25 ENCOUNTER — Other Ambulatory Visit: Payer: Self-pay

## 2022-06-25 ENCOUNTER — Emergency Department (HOSPITAL_COMMUNITY): Payer: 59

## 2022-06-25 DIAGNOSIS — R0789 Other chest pain: Secondary | ICD-10-CM | POA: Diagnosis not present

## 2022-06-25 DIAGNOSIS — I2510 Atherosclerotic heart disease of native coronary artery without angina pectoris: Secondary | ICD-10-CM | POA: Diagnosis not present

## 2022-06-25 DIAGNOSIS — I10 Essential (primary) hypertension: Secondary | ICD-10-CM | POA: Diagnosis not present

## 2022-06-25 DIAGNOSIS — Z794 Long term (current) use of insulin: Secondary | ICD-10-CM | POA: Insufficient documentation

## 2022-06-25 DIAGNOSIS — Z79899 Other long term (current) drug therapy: Secondary | ICD-10-CM | POA: Diagnosis not present

## 2022-06-25 DIAGNOSIS — E119 Type 2 diabetes mellitus without complications: Secondary | ICD-10-CM | POA: Insufficient documentation

## 2022-06-25 DIAGNOSIS — Z7982 Long term (current) use of aspirin: Secondary | ICD-10-CM | POA: Insufficient documentation

## 2022-06-25 LAB — BASIC METABOLIC PANEL
Anion gap: 10 (ref 5–15)
BUN: 10 mg/dL (ref 6–20)
CO2: 24 mmol/L (ref 22–32)
Calcium: 9 mg/dL (ref 8.9–10.3)
Chloride: 99 mmol/L (ref 98–111)
Creatinine, Ser: 0.85 mg/dL (ref 0.61–1.24)
GFR, Estimated: >60 mL/min (ref >60)
Glucose, Bld: 241 mg/dL — ABNORMAL HIGH (ref 70–99)
Potassium: 4.1 mmol/L (ref 3.5–5.1)
Sodium: 133 mmol/L — ABNORMAL LOW (ref 135–145)

## 2022-06-25 LAB — CBG MONITORING, ED: Glucose-Capillary: 202 mg/dL — ABNORMAL HIGH (ref 70–99)

## 2022-06-25 LAB — HEPATIC FUNCTION PANEL
ALT: 21 U/L (ref 0–44)
AST: 20 U/L (ref 15–41)
Albumin: 3.5 g/dL (ref 3.5–5.0)
Alkaline Phosphatase: 90 U/L (ref 38–126)
Bilirubin, Direct: 0.2 mg/dL (ref 0.0–0.2)
Indirect Bilirubin: 0.4 mg/dL (ref 0.3–0.9)
Total Bilirubin: 0.6 mg/dL (ref 0.3–1.2)
Total Protein: 6.8 g/dL (ref 6.5–8.1)

## 2022-06-25 LAB — CBC
HCT: 44.5 % (ref 39.0–52.0)
Hemoglobin: 15 g/dL (ref 13.0–17.0)
MCH: 30.3 pg (ref 26.0–34.0)
MCHC: 33.7 g/dL (ref 30.0–36.0)
MCV: 89.9 fL (ref 80.0–100.0)
Platelets: 252 10*3/uL (ref 150–400)
RBC: 4.95 MIL/uL (ref 4.22–5.81)
RDW: 12.6 % (ref 11.5–15.5)
WBC: 7.2 10*3/uL (ref 4.0–10.5)
nRBC: 0 % (ref 0.0–0.2)

## 2022-06-25 LAB — TROPONIN I (HIGH SENSITIVITY)
Troponin I (High Sensitivity): 3 ng/L (ref <18)
Troponin I (High Sensitivity): 3 ng/L (ref <18)

## 2022-06-25 MED ORDER — ASPIRIN 81 MG PO CHEW
324.0000 mg | CHEWABLE_TABLET | Freq: Once | ORAL | Status: AC
  Administered 2022-06-25: 324 mg via ORAL
  Filled 2022-06-25: qty 4

## 2022-06-25 NOTE — ED Triage Notes (Signed)
Pt. Stated, I having chest pain with some SOB for the last 30 min so I left work and came on over.

## 2022-06-25 NOTE — Discharge Instructions (Addendum)
It was a pleasure caring for you today.  EKG and troponins were reassuring-it does not appear you are having a heart attack at this time.  Blood labs were without concern for anemia or infection.  Chest x-ray was not concerning for any acute cardiopulmonary disease.  Ultrasound of her gallbladder was not concerning for cholecystitis.  I believe your pain today was caused by possible acid reflux.  Seek emergency care if experiencing any new or worsening symptoms such as vomiting, severe abdominal pain, severe chest pain, loss of consciousness.

## 2022-06-25 NOTE — ED Provider Notes (Signed)
[place] EMERGENCY DEPARTMENT AT [facility] Provider Note   CSN: [id number] Arrival date & time: 06/25/22  1108     History {Add pertinent medical, surgical, social history, OB history to HPI:1} Chief Complaint  Patient presents with   Shortness of Breath   Chest Pain    Greg Quinn is a 52 y.o. male with PMHx s/f DVT, PE, ACS, DM, AMI s/p PCI who presents to ED complaining of chest pain and SOB. 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 SOB resolved within a couple of minutes. Chest pain is constant, located in center of chest, and radiates towards RUQ and LUQ. Patient denies taking ASA or NTG today - did not have any on hand - and decided to come to ED. Patient not on any other blood thinners - taking 81mg  ASA daily. Patient with AMI s/p PCI in 07/2020 and has only had these symptoms occur one other time since discharge - which was relieved with ASA, Albuterol inhaler, and NTG.   Denies fever, n/v/d, cough, abdominal pain, dizziness, syncope, palpitations    Shortness of Breath Associated symptoms: chest pain   Chest Pain Associated symptoms: shortness of breath        Home Medications Prior to Admission medications   Medication Sig Start Date End Date Taking? Authorizing Provider  Accu-Chek Softclix Lancets lancets Use up to 4 times daily as directed 05/01/21   Marguerita Merles Latif, DO  albuterol (VENTOLIN HFA) 108 (90 Base) MCG/ACT inhaler Inhale 1-2 puffs into the lungs 4 (four) times daily as needed for wheezing or shortness of breath. 05/01/21   Marguerita Merles Latif, DO  Ascorbic Acid (VITAMIN C) 1000 MG tablet Take 1,000 mg by mouth 2 (two) times daily.    [provider]  atorvastatin (LIPITOR) 40 MG tablet Take 1 tablet (40 mg total) by mouth daily. 05/21/22   [name], MD  Blood Glucose Monitoring Suppl (BLOOD GLUCOSE MONITOR SYSTEM) w/Device KIT Use as directed 05/01/21   Marguerita Merles Latif, DO  carvedilol (COREG) 3.125 MG  tablet Take 1 tablet (3.125 mg total) by mouth 2 (two) times daily with a meal. 05/21/22   [name], MD  Continuous Blood Gluc Sensor (FREESTYLE LIBRE 3 SENSOR) MISC Place 1 sensor on the skin every 14 days. Use to check glucose continuously Patient not taking: Reported on 05/21/2022 01/11/22   [name], MD  glucose blood test strip Use up to 4 times daily 05/01/21   Marguerita Merles Latif, DO  insulin aspart (NOVOLOG FLEXPEN) 100 UNIT/ML FlexPen Inject 5 Units into the skin 3 (three) times daily with meals. 05/21/22   [name], MD  insulin glargine (LANTUS SOLOSTAR) 100 UNIT/ML Solostar Pen Inject 20 Units into the skin at bedtime. 05/21/22   [name], MD  Insulin Pen Needle (PEN NEEDLES) 31G X 8 MM MISC use as directed 05/21/22   [name], MD  Multiple Vitamin (MULTIVITAMIN WITH MINERALS) TABS tablet Take 1 tablet by mouth daily.    [provider]  nitroGLYCERIN (NITROSTAT) 0.4 MG SL tablet Place 1 tablet (0.4 mg total) under the tongue every 5 (five) minutes as needed for chest pain. Patient not taking: Reported on 10/26/2021 08/21/20 08/21/21  Cyndi Bender, NP  sulfamethoxazole-trimethoprim (BACTRIM DS) 800-160 MG tablet Take 1 tablet by mouth 2 (two) times daily. 05/21/22   [name], MD      Allergies    Heparin and Pork-derived products    Review  of Systems   Review of Systems  Respiratory:  Positive for shortness of breath.   Cardiovascular:  Positive for chest pain.    Physical Exam Updated Vital Signs BP (!) 145/90 (BP Location: Right Arm)   Pulse 80   Temp 98.4 F (36.9 C) (Oral)   Resp 16   SpO2 94%  Physical Exam Vitals and nursing note reviewed.  Constitutional:      General: He is not in acute distress.    Appearance: He is not ill-appearing, toxic-appearing or diaphoretic.  HENT:     Head: Normocephalic and atraumatic.     Mouth/Throat:     Mouth: Mucous membranes are moist.     Pharynx: No posterior  oropharyngeal erythema.  Eyes:     General: No scleral icterus.       Right eye: No discharge.        Left eye: No discharge.     Conjunctiva/sclera: Conjunctivae normal.  Cardiovascular:     Rate and Rhythm: Normal rate and regular rhythm.     Pulses: Normal pulses.     Heart sounds: No murmur heard.    No gallop.  Pulmonary:     Effort: Pulmonary effort is normal. No respiratory distress.     Breath sounds: Normal breath sounds. No wheezing, rhonchi or rales.  Abdominal:     Comments: RUQ tenderness to palpation. Negative Murphy sign.  Musculoskeletal:     Right lower leg: No edema.     Left lower leg: No edema.  Skin:    General: Skin is warm and dry.     Capillary Refill: Capillary refill takes less than 2 seconds.     Findings: No rash.  Neurological:     General: No focal deficit present.     Mental Status: He is alert. Mental status is at baseline.  Psychiatric:        Mood and Affect: Mood normal.     ED Results / Procedures / Treatments   Labs (all labs ordered are listed, but only abnormal results are displayed) Labs Reviewed  CBC  BASIC METABOLIC PANEL  TROPONIN I (HIGH SENSITIVITY)    EKG None  Radiology No results found.  Procedures Procedures  {Document cardiac monitor, telemetry assessment procedure when appropriate:1}  Medications Ordered in ED Medications - No data to display  ED Course/ Medical Decision Making/ A&P   {   Click here for ABCD2, HEART and other calculatorsREFRESH Note before signing :1}                          Medical Decision Making Amount and/or Complexity of Data Reviewed Labs: ordered. Radiology: ordered.  Risk OTC drugs.   This patient presents to the ED for concern of shortness of breath, this involves an extensive number of treatment options, and is a complaint that carries with it a high risk of complications and morbidity.  The differential diagnosis includes Anxiety, Anaphylaxis/Angioedema, Aspirated FB,  Arrhythmia, CHF, Asthma, COPD, PNA, COVID/Flu/RSV, STEMI, Tamponade, TPNX, DKA, Sepsis, Toxin   Co morbidities that complicate the patient evaluation  HTN, DM, ACS   Lab Tests:  I Ordered, and personally interpreted labs.  The pertinent results include:   - BMP: mild hyponatremia; no concern for kidney/liver damage - Trop: Initial and repeat within normal limits - POCG: 202 - CBC: No concern for anemia or leukocytosis - Hepatic function: within normal limits    Imaging Studies ordered:  I ordered imaging  studies including  -chest xray: no acute cardiopulmonary disease -RUQ [place]: no stones or ductal dilation I independently visualized and interpreted imaging I agree with the radiologist interpretation   Cardiac Monitoring: / EKG:  The patient was maintained on a cardiac monitor.  I personally viewed and interpreted the cardiac monitored which showed an underlying rhythm of: sinus rhythm without acute ST changes or arrythmias    Problem List / ED Course / Critical interventions / Medication management  Patient requesting to go home. I ordered medication including ***  for ***  Reevaluation of the patient after these medicines showed that the patient {resolved/improved/worsened:23923::"improved"} I have reviewed the patients home medicines and have made adjustments as needed Patient was given return precautions. Patient stable for discharge at this time. Patient verbalized understanding of plan.  DDx: These are considered less likely due to history of present illness and physical exam findings Anaphylaxis/Angioedema: patient denies breathing troubles in ED Arrhythmia: EKG with NSR Asthma/COPD/PNA/COVID/Flu/RSV: Lungs clear to auscultation bilaterally and O2 sat 94% STEMI: troponins and EKG reassuring CHF: no physical exam findings TPNX: Lungs clear to auscultation bilaterally DKA: no abdominal pain or nausea/vomiting Sepsis: afebrile and other vital signs  stable    Social Determinants of Health:  none      {Document critical care time when appropriate:1} {Document review of labs and clinical decision tools ie heart score, Chads2Vasc2 etc:1}  {Document your independent review of radiology images, and any outside records:1} {Document your discussion with family members, caretakers, and with consultants:1} {Document social determinants of health affecting pt's care:1} {Document your decision making why or why not admission, treatments were needed:1} Final Clinical Impression(s) / ED Diagnoses Final diagnoses:  None    Rx / DC Orders ED Discharge Orders     None

## 2022-06-28 ENCOUNTER — Other Ambulatory Visit: Payer: Self-pay | Admitting: Cardiovascular Disease

## 2022-06-28 ENCOUNTER — Other Ambulatory Visit: Payer: Self-pay

## 2022-06-28 MED ORDER — NITROGLYCERIN 0.4 MG SL SUBL
0.4000 mg | SUBLINGUAL_TABLET | SUBLINGUAL | 1 refills | Status: AC | PRN
  Filled 2022-06-28: qty 25, 9d supply, fill #0

## 2022-06-30 ENCOUNTER — Other Ambulatory Visit: Payer: Self-pay

## 2022-06-30 MED ORDER — BLOOD PRESSURE MONITOR DEVI
0 refills | Status: AC
  Filled 2022-06-30: qty 1, 30d supply, fill #0

## 2022-06-30 NOTE — Patient Instructions (Signed)
Greg Quinn,   We sent a prescription for a blood pressure machine to the Pharmacy at [facility], just in case your insurance does provider coverage.   If not, please consider purchasing a good quality blood pressure machine, such as an Omron-brand upper arm cuff. This can be purchased from any pharmacy, including our [facility] outpatient pharmacies, for ~$28-$30. We recommend a blood pressure cuff that goes around your upper arm, as these are generally the most accurate.   Check your blood pressure once daily, and any time you have concerning symptoms like headache, chest pain, dizziness, shortness of breath, or vision changes.   Our goal is less than 130/80.  To appropriately check your blood pressure, make sure you do the following:  1) Avoid caffeine, exercise, or tobacco products for 30 minutes before checking. Empty your bladder. 2) Sit with your back supported in a flat-backed chair. Rest your arm on something flat (arm of the chair, table, etc). 3) Sit still with your feet flat on the floor, resting, for at least 5 minutes.  4) Check your blood pressure. Take 1-2 readings.  5) Write down these readings and bring with you to any provider appointments.  Bring your home blood pressure machine with you to a provider's office for accuracy comparison at least once a year.   Make sure you take your blood pressure medications before you come to any office visit, even if you were asked to fast for labs.

## 2022-06-30 NOTE — Progress Notes (Signed)
   Greg Quinn [facility] [birth year] [id number]  Patient outreached by Sharion Dove , PharmD Candidate on 06/30/2022.  Blood Pressure Readings: Last documented ambulatory systolic blood pressure: 138 Last documented ambulatory diastolic blood pressure: 71 Does the patient have a validated home blood pressure machine?: No Patient does have a watch that records BP readings and the most recent reading was 115/73. Patient is aware that wrist measurements may not be accurate and is interested in obtaining a BP machine with an arm cuff. Patient also reports getting BP measurements at local pharmacies with readings being around 128/84.   Medication review was performed. Is the patient taking their medications as prescribed?: Yes  The following barriers to adherence were noted: Does the patient have cost concerns?: No Does the patient have transportation concerns?: No Does the patient need assistance obtaining refills?: No Does the patient occassionally forget to take some of their prescribed medications?: No Does the patient feel like one/some of their medications make them feel poorly?: Yes (feels like one of their medications may be causing them some anxiousness/ feels like their heart is racing) Does the patient have questions or concerns about their medications?: No Does the patient have a follow up scheduled with their primary care provider/cardiologist?: Yes  Patient reports being adherent to medications and has no problems with cost/transportation. They stated they were interested in obtaining a home BP machine with the arm cuff and a script was sent to their pharmacy. Patient was educated on next options if BP machine is not covered by insurance like OTC options. The patient reports feelings of anxiousness and a racing heart and is unsure if their medications are causing it. Patient also reported experiencing erectile dysfunction and wants to talk about options at their next PCP appointment.  Patient was instructed to bring a log of any recent BP readings and to take their BP medications prior to their PCP appointment.    Interventions: Interventions Completed: Medications were reviewed, Patient was educated on goal blood pressures and long term health implications of elevated blood pressure, Patient was educated on use of adherence strategies, like a pill box or alarms, Patient was educated on how to access home blood pressure machine  The patient has follow up scheduled:  PCP: Greg Matar, MD   Sharion Dove, Student-PharmD

## 2022-07-01 ENCOUNTER — Ambulatory Visit: Payer: 59 | Attending: Internal Medicine | Admitting: Internal Medicine

## 2022-07-01 ENCOUNTER — Other Ambulatory Visit: Payer: Self-pay | Admitting: Internal Medicine

## 2022-07-01 ENCOUNTER — Encounter: Payer: Self-pay | Admitting: Internal Medicine

## 2022-07-01 VITALS — BP 130/80 | HR 88 | Temp 98.3°F | Ht 70.0 in | Wt 260.0 lb

## 2022-07-01 DIAGNOSIS — Z1211 Encounter for screening for malignant neoplasm of colon: Secondary | ICD-10-CM

## 2022-07-01 DIAGNOSIS — E1169 Type 2 diabetes mellitus with other specified complication: Secondary | ICD-10-CM | POA: Diagnosis not present

## 2022-07-01 DIAGNOSIS — E1159 Type 2 diabetes mellitus with other circulatory complications: Secondary | ICD-10-CM

## 2022-07-01 DIAGNOSIS — I2510 Atherosclerotic heart disease of native coronary artery without angina pectoris: Secondary | ICD-10-CM

## 2022-07-01 DIAGNOSIS — E669 Obesity, unspecified: Secondary | ICD-10-CM | POA: Diagnosis not present

## 2022-07-01 DIAGNOSIS — K824 Cholesterolosis of gallbladder: Secondary | ICD-10-CM

## 2022-07-01 DIAGNOSIS — Z7985 Long-term (current) use of injectable non-insulin antidiabetic drugs: Secondary | ICD-10-CM

## 2022-07-01 DIAGNOSIS — I152 Hypertension secondary to endocrine disorders: Secondary | ICD-10-CM

## 2022-07-01 DIAGNOSIS — N5201 Erectile dysfunction due to arterial insufficiency: Secondary | ICD-10-CM

## 2022-07-01 LAB — GLUCOSE, POCT (MANUAL RESULT ENTRY): POC Glucose: 206 mg/dl — AB (ref 70–99)

## 2022-07-01 NOTE — Patient Instructions (Signed)
Increase Lantus insulin to 21 units at bedtime. Change mealtime insulin to 5 units with breakfast and 6 to 7 units with lunch and dinner.

## 2022-07-01 NOTE — Progress Notes (Signed)
Patient ID: Greg Quinn, male    DOB: 1970-05-05  MRN: [id number]  CC: Diabetes (DM f/u. Franchot Erichsen nitric oxide. /Experiencing anxiety before taking meds.)   Subjective: Greg Quinn is a 52 y.o. male who presents for 6 weeks follow-up on diabetes His concerns today include:  Patient with history of DM type II, fournier's gangrene of the left scrotum 03/2021 HTN, CAD/NSTEMI (stent 07/2020), HL, tob dep, obese, 2x PE/1 DVT status post IVC filter (declined restarting Xarelto 01/11/2022).    DM: On last visit his A1c was 15 He had been off his medicines due to financial limitations.  He had recently changed insurance.  We started him on Lantus 20 units at bedtime and NovoLog 5 units with meals.  We held off on restarting the metformin. -Reports compliance with taking both insulins. Checks blood sugars twice a day.  Range before breakfast 125-145; before dinner in the 200s. -Not able to get CGM due to out-of-pocket cost for him is too high. -Has eye appointment scheduled for next month.  HTN/CAD: Seen in the ER the end of last month for chest pain and shortness of breath.  Heart enzymes were normal.  Chest x-ray without acute finding.  Right upper quadrant ultrasound showed multiple gallbladder polyps.  Patient denies any nausea/vomiting or pain with meals. -No further chest pains since then.  He has not had to use nitroglycerin since ER visit.  Compliant with taking carvedilol 3.125 mg twice a day, atorvastatin 40 mg daily and aspirin over-the-counter.  Reports issues with ED since restarting his medications.  He is wanting to know whether it is safe for him to use nitric oxide natural supplement that can be purchased at the [facility] store or [place].  HM: Due for second Shingrix vaccine.  He declines having it today.  Will consider at next visit. Patient Active Problem List   Diagnosis Date Noted   Former smoker 05/09/2021   Positive colorectal cancer screening using Cologuard  test 05/09/2021   Obesity (BMI 30-39.9) 04/29/2021   Right foot pain 04/29/2021   Sepsis (HCC) 04/23/2021   CAD S/P percutaneous coronary angioplasty 04/23/2021   Scrotal infection 04/23/2021   Tobacco abuse 04/23/2021   DM (diabetes mellitus), type 2 with complications (HCC) 08/20/2020   Hypertension 08/20/2020   Hyperlipidemia LDL goal <70 08/20/2020   DVT, lower extremity (HCC) 08/20/2020   Current every day smoker 08/20/2020   Bilateral pulmonary embolism (HCC) 08/20/2020   Unstable angina (HCC) 08/20/2020     Current Outpatient Medications on File Prior to Visit  Medication Sig Dispense Refill   Accu-Chek Softclix Lancets lancets Use up to 4 times daily as directed 100 each 0   albuterol (VENTOLIN HFA) 108 (90 Base) MCG/ACT inhaler Inhale 1-2 puffs into the lungs 4 (four) times daily as needed for wheezing or shortness of breath. 6.7 g 0   Ascorbic Acid (VITAMIN C) 1000 MG tablet Take 1,000 mg by mouth 2 (two) times daily.     atorvastatin (LIPITOR) 40 MG tablet Take 1 tablet (40 mg total) by mouth daily. 90 tablet 1   Blood Glucose Monitoring Suppl (BLOOD GLUCOSE MONITOR SYSTEM) w/Device KIT Use as directed 1 kit 0   Blood Pressure Monitor DEVI Use to check blood pressure daily. I10.0 Hypertension 1 each 0   carvedilol (COREG) 3.125 MG tablet Take 1 tablet (3.125 mg total) by mouth 2 (two) times daily with a meal. 180 tablet 1   Continuous Blood Gluc Sensor (FREESTYLE LIBRE 3 SENSOR) MISC  Place 1 sensor on the skin every 14 days. Use to check glucose continuously 2 each 6   glucose blood test strip Use up to 4 times daily 100 each 0   insulin aspart (NOVOLOG FLEXPEN) 100 UNIT/ML FlexPen Inject 5 Units into the skin 3 (three) times daily with meals. 15 mL 11   insulin glargine (LANTUS SOLOSTAR) 100 UNIT/ML Solostar Pen Inject 20 Units into the skin at bedtime. 15 mL 11   Insulin Pen Needle (PEN NEEDLES) 31G X 8 MM MISC use as directed 100 each 6   Multiple Vitamin (MULTIVITAMIN  WITH MINERALS) TABS tablet Take 1 tablet by mouth daily.     nitroGLYCERIN (NITROSTAT) 0.4 MG SL tablet Place 1 tablet (0.4 mg total) under the tongue every 5 (five) minutes as needed for chest pain.Please schedule an appointment. 25 tablet 1   No current facility-administered medications on file prior to visit.    Allergies  Allergen Reactions   Heparin Other (See Comments)    Possible HIT, patient spent a week in the ICU.   Pork-Derived Products Anaphylaxis    Social History   Socioeconomic History   Marital status: Single    Spouse name: Not on file   Number of children: Not on file   Years of education: Not on file   Highest education level: Some college, no degree  Occupational History   Not on file  Tobacco Use   Smoking status: Former    Packs/day: 1    Types: Cigarettes    Quit date: 04/22/2021    Years since quitting: 1.1   Smokeless tobacco: Never  Substance and Sexual Activity   Alcohol use: Yes    Comment: occasional   Drug use: Never   Sexual activity: Not on file  Other Topics Concern   Not on file  Social History Narrative   Not on file   Social Determinants of Health   Financial Resource Strain: High Risk (06/27/2022)   Overall Financial Resource Strain (CARDIA)    Difficulty of Paying Living Expenses: Hard  Food Insecurity: Food Insecurity Present (06/27/2022)   Hunger Vital Sign    Worried About Running Out of Food in the Last Year: Sometimes true    Ran Out of Food in the Last Year: Sometimes true  Transportation Needs: No Transportation Needs (06/27/2022)   PRAPARE - Administrator, Civil Service (Medical): No    Lack of Transportation (Non-Medical): No  Physical Activity: Sufficiently Active (06/27/2022)   Exercise Vital Sign    Days of Exercise per Week: 4 days    Minutes of Exercise per Session: 40 min  Stress: No Stress Concern Present (06/27/2022)   [employer] of Occupational Health - Occupational Stress Questionnaire     Feeling of Stress : Only a little  Social Connections: Moderately Isolated (06/27/2022)   Social Connection and Isolation Panel [NHANES]    Frequency of Communication with Friends and Family: More than three times a week    Frequency of Social Gatherings with Friends and Family: Once a week    Attends Religious Services: 1 to 4 times per year    Active Member of [employer] or Organizations: No    Attends Engineer, structural: Not on file    Marital Status: Divorced  Catering manager Violence: Not on file    Family History  Problem Relation Age of Onset   Diabetes Father     Past Surgical History:  Procedure Laterality Date   CORONARY STENT  INTERVENTION N/A 08/20/2020   Procedure: CORONARY STENT INTERVENTION;  Surgeon: Corky Crafts, MD;  Location: [facility] INVASIVE CV LAB;  Service: Cardiovascular;  Laterality: N/A;   CORONARY ULTRASOUND/IVUS N/A 08/20/2020   Procedure: Intravascular Ultrasound/IVUS;  Surgeon: Corky Crafts, MD;  Location: [facility] INVASIVE CV LAB;  Service: Cardiovascular;  Laterality: N/A;   CYSTOSCOPY N/A 04/23/2021   Procedure: CYSTOSCOPY;  Surgeon: Alfredo Martinez, MD;  Location: [facility] OR;  Service: Urology;  Laterality: N/A;   INCISION AND DRAINAGE ABSCESS N/A 04/23/2021   Procedure: INCISION AND DRAINAGE ABSCESS;  Surgeon: Alfredo Martinez, MD;  Location: [facility] OR;  Service: Urology;  Laterality: N/A;   LEFT HEART CATH AND CORONARY ANGIOGRAPHY N/A 08/20/2020   Procedure: LEFT HEART CATH AND CORONARY ANGIOGRAPHY;  Surgeon: Corky Crafts, MD;  Location: [facility] INVASIVE CV LAB;  Service: Cardiovascular;  Laterality: N/A;    ROS: Review of Systems Negative except as stated above  PHYSICAL EXAM: BP 130/80   Pulse 88   Temp 98.3 F (36.8 C) (Oral)   Ht 5\' 10"  (1.778 m)   Wt 260 lb (117.9 kg)   SpO2 96%   BMI 37.31 kg/m   Physical Exam   General appearance - alert, well appearing, middle-age male and in no distress Mental status - normal mood,  behavior, speech, dress, motor activity, and thought processes Neck - supple, no significant adenopathy Chest - clear to auscultation, no wheezes, rales or rhonchi, symmetric air entry Heart - normal rate, regular rhythm, normal S1, S2, no murmurs, rubs, clicks or gallops Extremities - peripheral pulses normal, no pedal edema, no clubbing or cyanosis     Latest Ref Rng & Units 06/25/2022    1:28 PM 06/25/2022   11:31 AM 05/26/2022    1:57 PM  CMP  Glucose 70 - 99 mg/dL  161  096   BUN 6 - 20 mg/dL  10  7   Creatinine 0.45 - 1.24 mg/dL  4.09  8.11   Sodium 914 - 145 mmol/L  133  137   Potassium 3.5 - 5.1 mmol/L  4.1  4.5   Chloride 98 - 111 mmol/L  99  103   CO2 22 - 32 mmol/L  24  17   Calcium 8.9 - 10.3 mg/dL  9.0  9.3   Total Protein 6.5 - 8.1 g/dL 6.8   7.2   Total Bilirubin 0.3 - 1.2 mg/dL 0.6   0.4   Alkaline Phos 38 - 126 U/L 90   148   AST 15 - 41 U/L 20   21   ALT 0 - 44 U/L 21   24    Lipid Panel     Component Value Date/Time   CHOL 146 08/21/2020 0207   TRIG 57 08/21/2020 0207   HDL 42 08/21/2020 0207   CHOLHDL 3.5 08/21/2020 0207   VLDL 11 08/21/2020 0207   LDLCALC 93 08/21/2020 0207    CBC    Component Value Date/Time   WBC 7.2 06/25/2022 1131   RBC 4.95 06/25/2022 1131   HGB 15.0 06/25/2022 1131   HCT 44.5 06/25/2022 1131   PLT 252 06/25/2022 1131   MCV 89.9 06/25/2022 1131   MCH 30.3 06/25/2022 1131   MCHC 33.7 06/25/2022 1131   RDW 12.6 06/25/2022 1131   LYMPHSABS 1.9 10/25/2021 2328   MONOABS 0.8 10/25/2021 2328   EOSABS 0.1 10/25/2021 2328   BASOSABS 0.0 10/25/2021 2328    ASSESSMENT AND PLAN: 1. Type 2 diabetes mellitus with obesity (HCC)  Morning blood sugar close to goal.  Blood sugars before dinner not at goal.  I recommend increasing Lantus insulin to 21 units daily.  Change NovoLog insulin to 5 units with breakfast and 6 to 7 units with lunch and dinner. Encourage healthy eating habits. Keep appointment for diabetic eye exam - POCT glucose  (manual entry) - Microalbumin / creatinine urine ratio  2. Hypertension associated with diabetes (HCC) At goal.  Continue carvedilol 3.125 mg twice a day.  3. Coronary artery disease involving native coronary artery of native heart without angina pectoris Stable since recent ER visit. Continue ASA OTC, Lipitor and Coreg - Ambulatory referral to Cardiology  4. Gall bladder polyp Advised patient that there is an increased risk of gallbladder cancer with gallbladder polyps especially polyps greater than or equal to 10 mm.  Greatest size reported by radiologist is 5 mm.  We can refer to GI for advice on whether cholecystectomy would be indicated given multiple polyps.  Patient is asymptomatic.  5. Erectile dysfunction due to arterial insufficiency Advised patient that I not familiar with or have recommended over-the-counter nitric oxide supplement for ED.  We discussed trying him with Viagra or Cialis given that he is not on long-term nitrates.  He would just need to be careful with the use of sublingual nitroglycerin which he uses infrequently.  Patient states he does not want to be placed on Cialis or Viagra because of possible side effects.  I went over possible side effects of Viagra including flushing, sudden vision or hearing changes, prolonged erection etc.  Patient is still not interested.  6. Screening for colon cancer Patient prefers Cologuard test rather than colonoscopy. - Cologuard     Patient was given the opportunity to ask questions.  Patient verbalized understanding of the plan and was able to repeat key elements of the plan.   This documentation was completed using Paediatric nurse.  Any transcriptional errors are unintentional.  Orders Placed This Encounter  Procedures   Cologuard   Microalbumin / creatinine urine ratio   Ambulatory referral to Cardiology   POCT glucose (manual entry)     Requested Prescriptions    No prescriptions requested or  ordered in this encounter    Return in about 4 months (around 10/31/2022).  Jonah Blue, MD, FACP

## 2022-07-04 LAB — MICROALBUMIN / CREATININE URINE RATIO
Creatinine, Urine: 283.5 mg/dL
Microalb/Creat Ratio: 75 mg/g creat — ABNORMAL HIGH (ref 0–29)
Microalbumin, Urine: 212.6 ug/mL

## 2022-07-08 ENCOUNTER — Telehealth: Payer: Self-pay | Admitting: Internal Medicine

## 2022-07-08 DIAGNOSIS — R195 Other fecal abnormalities: Secondary | ICD-10-CM

## 2022-07-14 NOTE — Telephone Encounter (Signed)
Called & spoke to the patient. Verified name & DOB. Informed of positive cologuard test on 10/14/2020 and that a referral to a gastroenterologist has been sent. Instructed patient not to retake the cologuard test and await for the gastroenterologist office to call to schedule an appointment. Patient expressed verbal understanding of all discussed.

## 2022-08-01 NOTE — Progress Notes (Deleted)
Office Visit    Patient Name: Greg Quinn Date of Encounter: 08/01/2022  Primary Care Provider:  [name], MD Primary Cardiologist:  Thurmon Fair, MD  Chief Complaint  52 year old male with past medical history of CAD s/p NSTEMI with subsequent cardiac cathereization in 07/2020 with DES to mid Cx, hypertension, hyperlipidemia, type 2 diabetes, tobacco abuse, history of DVT/PE s/p IVC fiter. He presents today for follow up for CAD.   Past Medical History    Past Medical History:  Diagnosis Date   Bilateral pulmonary embolism (HCC)    2009, on xarelto, IVC filter   Coronary artery disease    Current every day smoker    25 pack year history   DVT, lower extremity (HCC)    2009   Hyperlipidemia LDL goal <70    Hypertension    NSTEMI (non-ST elevated myocardial infarction) (HCC) 08/20/2020   NSTEMI (non-ST elevated myocardial infarction) (HCC) 08/20/2020   Uncontrolled diabetes mellitus    Past Surgical History:  Procedure Laterality Date   CORONARY STENT INTERVENTION N/A 08/20/2020   Procedure: CORONARY STENT INTERVENTION;  Surgeon: Corky Crafts, MD;  Location: [facility] INVASIVE CV LAB;  Service: Cardiovascular;  Laterality: N/A;   CORONARY ULTRASOUND/IVUS N/A 08/20/2020   Procedure: Intravascular Ultrasound/IVUS;  Surgeon: Corky Crafts, MD;  Location: [facility] INVASIVE CV LAB;  Service: Cardiovascular;  Laterality: N/A;   CYSTOSCOPY N/A 04/23/2021   Procedure: CYSTOSCOPY;  Surgeon: Alfredo Martinez, MD;  Location: [facility] OR;  Service: Urology;  Laterality: N/A;   INCISION AND DRAINAGE ABSCESS N/A 04/23/2021   Procedure: INCISION AND DRAINAGE ABSCESS;  Surgeon: Alfredo Martinez, MD;  Location: [facility] OR;  Service: Urology;  Laterality: N/A;   LEFT HEART CATH AND CORONARY ANGIOGRAPHY N/A 08/20/2020   Procedure: LEFT HEART CATH AND CORONARY ANGIOGRAPHY;  Surgeon: Corky Crafts, MD;  Location: [facility] INVASIVE CV LAB;  Service: Cardiovascular;  Laterality: N/A;     Allergies  Allergies  Allergen Reactions   Heparin Other (See Comments)    Possible HIT, patient spent a week in the ICU.   Pork-Derived Products Anaphylaxis     Labs/Other Studies Reviewed    The following studies were reviewed today: *** Cardiac Studies & Procedures   CARDIAC CATHETERIZATION  CARDIAC CATHETERIZATION 08/20/2020  Narrative   Mid Cx lesion is 90% stenosed.   Dist RCA lesion is 40% stenosed.   Prox LAD to Mid LAD lesion is 25% stenosed.   A drug-eluting stent was successfully placed using a STENT ONYX FRONTIER 4.0X26, postdilated to 4.5 mm.   Post intervention, there is a 0% residual stenosis.   The left ventricular systolic function is normal.   LV end diastolic pressure is mildly elevated.   The left ventricular ejection fraction is 55-65% by visual estimate.   There is no aortic valve stenosis.  Recommend to resume Rivaroxaban, at currently prescribed dose and frequency on 08/21/2020. Recommend concurrent antiplatelet therapy of Aspirin 81 mg for 1 month and Clopidogrel 75mg  daily for 12 months.  Loading dose of clopidogrel ordered for tomorrow.  Brilinta was given at the time of PCI, but will need to be changed to clopidogrel due to Xarelto.  Aggressive secondary prevention including smoking cessation.  Findings Coronary Findings Diagnostic  Dominance: Right  Left Anterior Descending Prox LAD to Mid LAD lesion is 25% stenosed.  Left Circumflex Vessel is large. Mid Cx lesion is 90% stenosed.  Right Coronary Artery The vessel exhibits minimal luminal irregularities. Dist RCA lesion is  40% stenosed.  Intervention  Mid Cx lesion Stent CATH LAUNCHER 6FR EBU3.5 guide catheter was inserted. Lesion crossed with guidewire using a WIRE ASAHI PROWATER 180CM. Pre-stent angioplasty was performed using a BALLOON SAPPHIRE 3.0X20. A drug-eluting stent was successfully placed using a STENT ONYX FRONTIER 4.0X26. Stent strut is well apposed. Post-stent  angioplasty was performed using a BALLOON [place] EMERGE MR 4.5X12. IVUS was done after stent implantation to guide the size of the postdilatation balloon.  After PTCA, there was slow flow noted so IVUS was deferred until after stent placement. Post-Intervention Lesion Assessment The intervention was successful. Pre-interventional TIMI flow is 3. Post-intervention TIMI flow is 3. No complications occurred at this lesion. There is a 0% residual stenosis post intervention.     ECHOCARDIOGRAM  ECHOCARDIOGRAM COMPLETE 08/20/2020  Narrative ECHOCARDIOGRAM REPORT    Patient Name:   Greg Quinn [facility] Date of Exam: 08/20/2020 Medical Rec #:  [id number]                Height:       70.5 in Accession #:    [id number]               Weight:       262.0 lb Date of Birth:  17-Jun-1970                BSA:          2.354 m Patient Age:    50 years                 BP:           121/86 mmHg Patient Gender: M                        HR:           83 bpm. Exam Location:  Inpatient  Procedure: 2D Echo, Cardiac Doppler and Color Doppler  Indications:    NSTEMI  History:        Patient has no prior history of Echocardiogram examinations. Risk Factors:Dyslipidemia, Diabetes, Hypertension and Current Smoker. Hx DVT/PE. S/p PCI.  Sonographer:    Ross Ludwig RDCS (AE) Referring Phys: [id number] ANGELA NICOLE DUKE  IMPRESSIONS   1. Left ventricular ejection fraction, by estimation, is 60 to 65%. The left ventricle has normal function. The left ventricle has no regional wall motion abnormalities. There is mild left ventricular hypertrophy. Left ventricular diastolic parameters were normal. 2. Right ventricular systolic function is normal. The right ventricular size is normal. Tricuspid regurgitation signal is inadequate for assessing PA pressure. 3. The mitral valve is normal in structure. Trivial mitral valve regurgitation. No evidence of mitral stenosis. 4. The aortic valve is tricuspid. Aortic valve  regurgitation is not visualized. No aortic stenosis is present. 5. The inferior vena cava is dilated in size with >50% respiratory variability, suggesting right atrial pressure of 8 mmHg.  FINDINGS Left Ventricle: Left ventricular ejection fraction, by estimation, is 60 to 65%. The left ventricle has normal function. The left ventricle has no regional wall motion abnormalities. The left ventricular internal cavity size was normal in size. There is mild left ventricular hypertrophy. Left ventricular diastolic parameters were normal.  Right Ventricle: The right ventricular size is normal. No increase in right ventricular wall thickness. Right ventricular systolic function is normal. Tricuspid regurgitation signal is inadequate for assessing PA pressure.  Left Atrium: Left atrial size was normal in size.  Right Atrium: Right atrial size was normal in  size.  Pericardium: There is no evidence of pericardial effusion.  Mitral Valve: The mitral valve is normal in structure. Trivial mitral valve regurgitation. No evidence of mitral valve stenosis. MV peak gradient, 6.1 mmHg. The mean mitral valve gradient is 2.0 mmHg.  Tricuspid Valve: The tricuspid valve is normal in structure. Tricuspid valve regurgitation is trivial.  Aortic Valve: The aortic valve is tricuspid. Aortic valve regurgitation is not visualized. No aortic stenosis is present. Aortic valve mean gradient measures 6.0 mmHg. Aortic valve peak gradient measures 10.0 mmHg. Aortic valve area, by VTI measures 2.11 cm.  Pulmonic Valve: The pulmonic valve was not well visualized. Pulmonic valve regurgitation is not visualized.  Aorta: The aortic root and ascending aorta are structurally normal, with no evidence of dilitation.  Venous: The inferior vena cava is dilated in size with greater than 50% respiratory variability, suggesting right atrial pressure of 8 mmHg.  IAS/Shunts: The interatrial septum was not well visualized.   LEFT  VENTRICLE PLAX 2D LVIDd:         3.90 cm  Diastology LVIDs:         2.20 cm  LV e' medial:    12.40 cm/s LV PW:         1.50 cm  LV E/e' medial:  6.6 LV IVS:        1.20 cm  LV e' lateral:   12.60 cm/s LVOT diam:     1.80 cm  LV E/e' lateral: 6.5 LV SV:         56 LV SV Index:   24 LVOT Area:     2.54 cm   RIGHT VENTRICLE             IVC RV Basal diam:  2.90 cm     IVC diam: 2.60 cm RV S prime:     15.90 cm/s TAPSE (M-mode): 2.0 cm  LEFT ATRIUM             Index       RIGHT ATRIUM           Index LA diam:        2.70 cm 1.15 cm/m  RA Area:     15.00 cm LA Vol (A2C):   62.3 ml 26.47 ml/m RA Volume:   41.40 ml  17.59 ml/m LA Vol (A4C):   47.8 ml 20.31 ml/m LA Biplane Vol: 59.5 ml 25.28 ml/m AORTIC VALVE AV Area (Vmax):    1.93 cm AV Area (Vmean):   1.61 cm AV Area (VTI):     2.11 cm AV Vmax:           158.00 cm/s AV Vmean:          118.000 cm/s AV VTI:            0.268 m AV Peak Grad:      10.0 mmHg AV Mean Grad:      6.0 mmHg LVOT Vmax:         120.00 cm/s LVOT Vmean:        74.500 cm/s LVOT VTI:          0.222 m LVOT/AV VTI ratio: 0.83  AORTA Ao Root diam: 3.20 cm Ao Asc diam:  3.20 cm  MITRAL VALVE MV Area (PHT): 3.65 cm    SHUNTS MV Area VTI:   1.99 cm    Systemic VTI:  0.22 m MV Peak grad:  6.1 mmHg    Systemic Diam: 1.80 cm MV Mean grad:  2.0 mmHg  MV Vmax:       1.23 m/s MV Vmean:      63.9 cm/s MV Decel Time: 208 msec MV E velocity: 81.30 cm/s MV A velocity: 74.70 cm/s MV E/A ratio:  1.09  Epifanio Lesches MD Electronically signed by Epifanio Lesches MD Signature Date/Time: 08/20/2020/5:44:33 PM    Final            Recent Labs: 06/25/2022: ALT 21; BUN 10; Creatinine, Ser 0.85; Hemoglobin 15.0; Platelets 252; Potassium 4.1; Sodium 133  Recent Lipid Panel    Component Value Date/Time   CHOL 146 08/21/2020 0207   TRIG 57 08/21/2020 0207   HDL 42 08/21/2020 0207   CHOLHDL 3.5 08/21/2020 0207   VLDL 11 08/21/2020 0207   LDLCALC  93 08/21/2020 0207    History of Present Illness  52 year old male with past medical history of CAD s/p NSTEMI with subsequent cardiac cathereization in 07/2020 with DES to mid Cx, hypertension, hyperlipidemia, type 2 diabetes, tobacco abuse, history of DVT/PE s/p IVC fiter.  He has an extensive history of DVT and his lower extremity, right lower extremity and bilateral PE s/p IVC filter.  Stress test in 2017 following in [place] was reportedly normal.  He was admitted in July 2022 in the setting of NSTEMI.  Catheterization revealed MLCx 90% s/p DES, dRCA 40%, and p-mLAD 25% stenosis, EF 55-65%m mildly elevated LVEDP. Echo at the time indicated an EF of 60 to 65%, no RWMA, mild LVH, no significant valvular abnormalities.  He was not seen for follow-up for almost a year.  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 His shorntess of breath resolved after a few minutes but chest pain was constat, located in center of his chest that radiated to the RUQ and LUQ. He had RUQ tenderness to palpation. Labs in the ED indicated a sodium level of 133, potassium 4.1, creatinine 0.85, troponin's 3>>3. His EKG was unchanged compared to priors. He did have a RUQ ultrasound that showed the gallbladder as mildly distended with non dependent nodular foci along the gallbladder wall measuring up to 5mm, noted to likely be small polyps. Chest xray indicated no active cardiopulmonary disease. His chest pain resolved prior to ED discharge.   Today  -Further chest pain?  EKG reviewed from 06/28/22, indicated NSR at 78 bpm with no acute ST/T wave changes.   CAD:  Cath in 2022 showed mLCx 90-0% s/p DES, dRCA 40%, and p-mLAD 25% stenosis, EF 55-65%, mildly elevated LVEDP. Echo showed EF 60 to 65%, no RWMA, mild LVH, no  significant valvular abnormalities.  He continued Plavix through July 2023 previously not on ASA therapy in the setting of chronic DOAC therapy. Recent ED visit in May  Chest pain?   Hypertension:  BP today: BP at home:   Hyperlipidemia: Managed by PCP. Last lipid profile in 07/2020 indicated total cholesterol 146 and LDL 93. Check fasting lipid profile. Continue atorvastatin pending lipid profile.  Continue atorvastatin   H/o PE/DVT: Per notes Xarelto stopped in 2023. Managed per PCP.   Type 2 diabetes: Managed by PCP.   Tobacco abuse:   Disposition:   Home Medications    Current Outpatient Medications  Medication Sig Dispense Refill   Accu-Chek Softclix Lancets lancets Use up to 4 times daily  as directed 100 each 0   albuterol (VENTOLIN HFA) 108 (90 Base) MCG/ACT inhaler Inhale 1-2 puffs into the lungs 4 (four) times daily as needed for wheezing or shortness of breath. 6.7 g 0   Ascorbic Acid (VITAMIN C) 1000 MG tablet Take 1,000 mg by mouth 2 (two) times daily.     atorvastatin (LIPITOR) 40 MG tablet Take 1 tablet (40 mg total) by mouth daily. 90 tablet 1   Blood Glucose Monitoring Suppl (BLOOD GLUCOSE MONITOR SYSTEM) w/Device KIT Use as directed 1 kit 0   Blood Pressure Monitor DEVI Use to check blood pressure daily. I10.0 Hypertension 1 each 0   carvedilol (COREG) 3.125 MG tablet Take 1 tablet (3.125 mg total) by mouth 2 (two) times daily with a meal. 180 tablet 1   Continuous Blood Gluc Sensor (FREESTYLE LIBRE 3 SENSOR) MISC Place 1 sensor on the skin every 14 days. Use to check glucose continuously 2 each 6   glucose blood test strip Use up to 4 times daily 100 each 0   insulin aspart (NOVOLOG FLEXPEN) 100 UNIT/ML FlexPen Inject 5 Units into the skin 3 (three) times daily with meals. 15 mL 11   insulin glargine (LANTUS SOLOSTAR) 100 UNIT/ML Solostar Pen Inject 20 Units into the skin at bedtime. 15 mL 11   Insulin Pen Needle (PEN NEEDLES) 31G X 8 MM MISC use as directed 100  each 6   Multiple Vitamin (MULTIVITAMIN WITH MINERALS) TABS tablet Take 1 tablet by mouth daily.     nitroGLYCERIN (NITROSTAT) 0.4 MG SL tablet Place 1 tablet (0.4 mg total) under the tongue every 5 (five) minutes as needed for chest pain.Please schedule an appointment. 25 tablet 1   No current facility-administered medications for this visit.     Review of Systems    ***.  All other systems reviewed and are otherwise negative except as noted above.    Physical Exam    VS:  There were no vitals taken for this visit. , BMI There is no height or weight on file to calculate BMI.     GEN: Well nourished, well developed, in no acute distress. HEENT: normal. Neck: Supple, no JVD, carotid bruits, or masses. Cardiac: RRR, no murmurs, rubs, or gallops. No clubbing, cyanosis, edema.  Radials/DP/PT 2+ and equal bilaterally.  Respiratory:  Respirations regular and unlabored, clear to auscultation bilaterally. GI: Soft, nontender, nondistended, BS + x 4. MS: no deformity or atrophy. Skin: warm and dry, no rash. Neuro:  Strength and sensation are intact. Psych: Normal affect.  Accessory Clinical Findings    ECG personally reviewed by me today -    - no acute changes.   Lab Results  Component Value Date   WBC 7.2 06/25/2022   HGB 15.0 06/25/2022   HCT 44.5 06/25/2022   MCV 89.9 06/25/2022   PLT 252 06/25/2022   Lab Results  Component Value Date   CREATININE 0.85 06/25/2022   BUN 10 06/25/2022   NA 133 (L) 06/25/2022   K 4.1 06/25/2022   CL 99 06/25/2022   CO2 24 06/25/2022   Lab Results  Component Value Date   ALT 21 06/25/2022   AST 20 06/25/2022   ALKPHOS 90 06/25/2022   BILITOT 0.6 06/25/2022   Lab Results  Component Value Date   CHOL 146 08/21/2020   HDL 42 08/21/2020   LDLCALC 93 08/21/2020   TRIG 57 08/21/2020   CHOLHDL 3.5 08/21/2020    Lab Results  Component Value Date  HGBA1C 15.0 (A) 05/21/2022    Assessment & Plan    1.  ***  No BP recorded.   {Refresh Note OR Click here to enter BP  :1}***   Rip Harbour, NP 08/01/2022, 1:34 PM

## 2022-08-02 ENCOUNTER — Ambulatory Visit: Payer: 59 | Attending: General Practice | Admitting: General Practice

## 2022-08-02 DIAGNOSIS — E785 Hyperlipidemia, unspecified: Secondary | ICD-10-CM

## 2022-08-02 DIAGNOSIS — E118 Type 2 diabetes mellitus with unspecified complications: Secondary | ICD-10-CM

## 2022-08-02 DIAGNOSIS — I2699 Other pulmonary embolism without acute cor pulmonale: Secondary | ICD-10-CM

## 2022-08-02 DIAGNOSIS — I10 Essential (primary) hypertension: Secondary | ICD-10-CM

## 2022-08-02 DIAGNOSIS — I2510 Atherosclerotic heart disease of native coronary artery without angina pectoris: Secondary | ICD-10-CM

## 2022-08-02 DIAGNOSIS — I825Y9 Chronic embolism and thrombosis of unspecified deep veins of unspecified proximal lower extremity: Secondary | ICD-10-CM

## 2022-08-02 DIAGNOSIS — Z720 Tobacco use: Secondary | ICD-10-CM

## 2022-08-10 IMAGING — DX DG CHEST 1V PORT
1 series · 1 of 1 positions shown · non-contrast
Comparison: None.

CLINICAL DATA: Cough with mid to left-sided chest pain x3 days.

EXAM:
PORTABLE CHEST 1 VIEW

[chest ap]
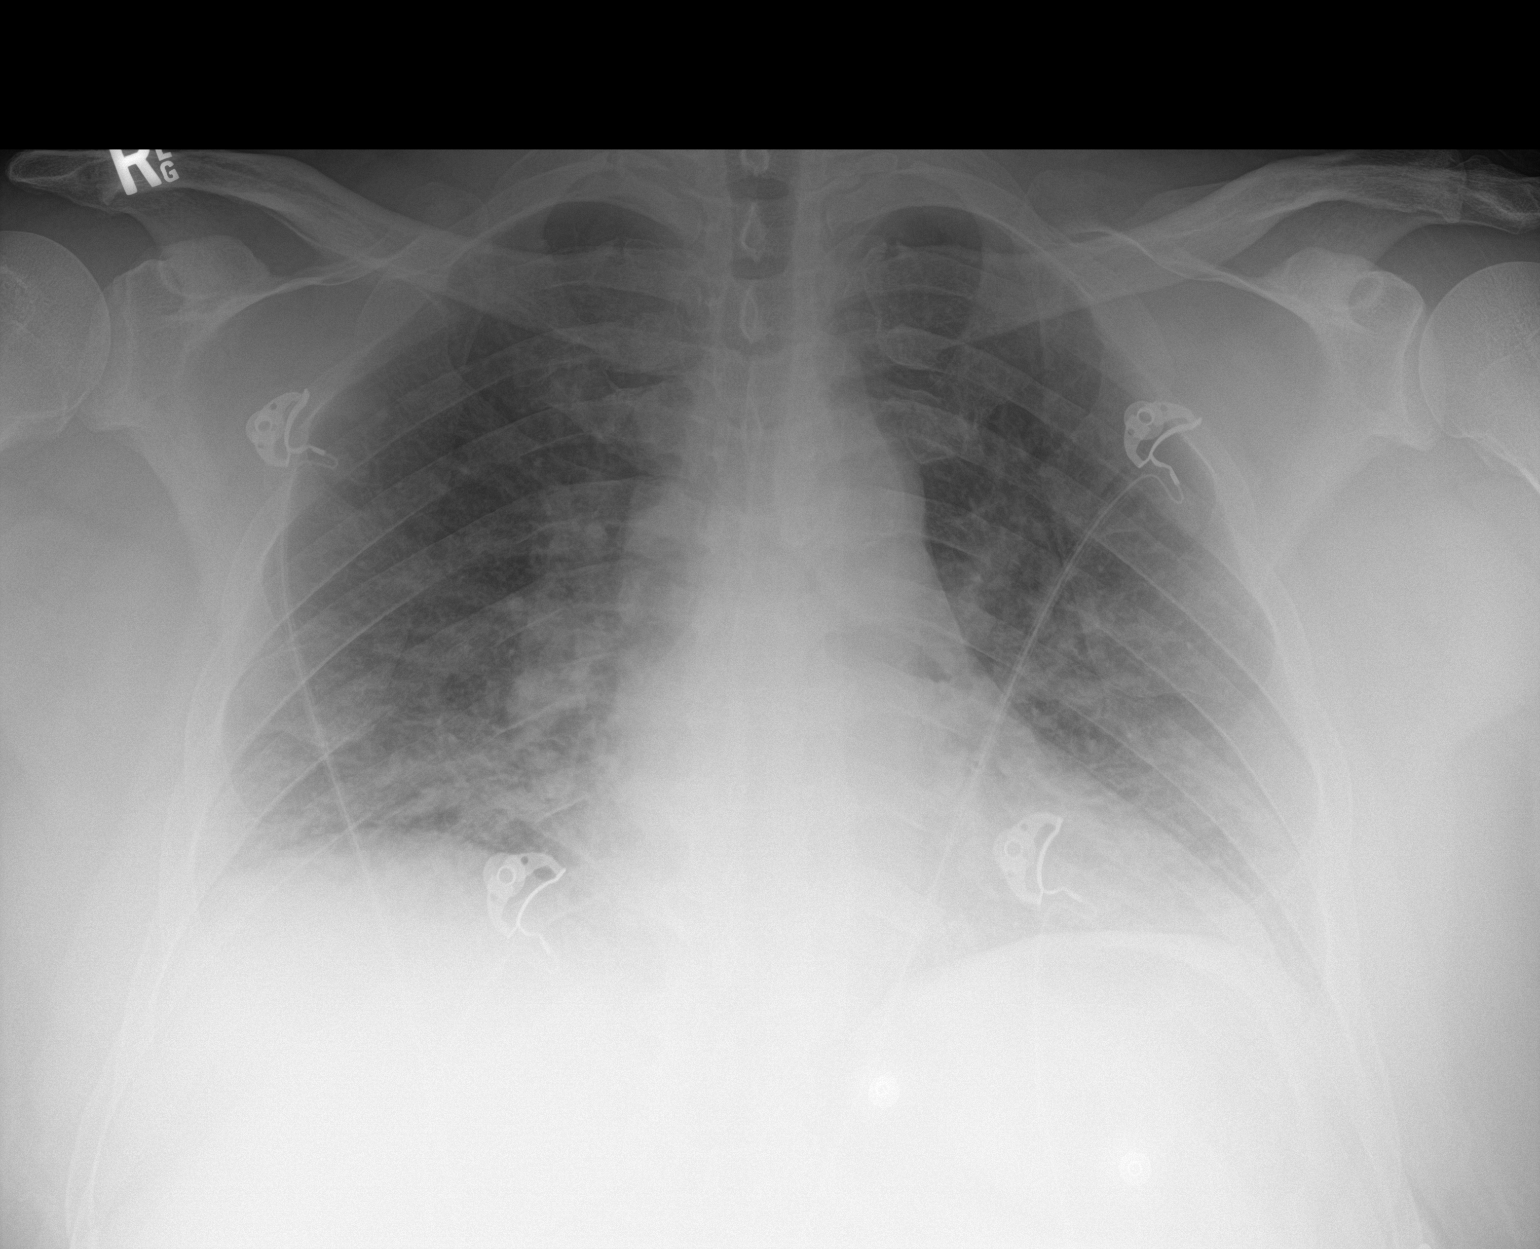

[1 of 1 positions shown; findings below may reference images not displayed]

FINDINGS: Decreased lung volumes are seen which is likely secondary to the
degree of patient inspiration. Mild areas of atelectasis and/or
infiltrate noted within the bilateral lung bases. There is no
evidence of a pleural effusion or pneumothorax. The heart size and
mediastinal contours are within normal limits. The visualized
skeletal structures are unremarkable.
IMPRESSION: Decreased lung volumes with mild bibasilar atelectasis and/or
infiltrate.

## 2022-08-13 ENCOUNTER — Other Ambulatory Visit: Payer: Self-pay

## 2022-08-19 ENCOUNTER — Other Ambulatory Visit: Payer: Self-pay

## 2022-08-25 ENCOUNTER — Other Ambulatory Visit: Payer: Self-pay

## 2022-09-07 NOTE — Progress Notes (Deleted)
Cardiology Clinic Note   Patient Name: Greg Quinn Date of Encounter: 09/07/2022  Primary Care Provider:  [name], MD Primary Cardiologist:  Thurmon Fair, MD  Patient Profile    Greg Quinn 52 year old male presents the clinic today for follow-up evaluation of his coronary artery disease and hypertension.  Past Medical History    Past Medical History:  Diagnosis Date   Bilateral pulmonary embolism (HCC)    2009, on xarelto, IVC filter   Coronary artery disease    Current every day smoker    25 pack year history   DVT, lower extremity (HCC)    2009   Hyperlipidemia LDL goal <70    Hypertension    NSTEMI (non-ST elevated myocardial infarction) (HCC) 08/20/2020   NSTEMI (non-ST elevated myocardial infarction) (HCC) 08/20/2020   Uncontrolled diabetes mellitus    Past Surgical History:  Procedure Laterality Date   CORONARY STENT INTERVENTION N/A 08/20/2020   Procedure: CORONARY STENT INTERVENTION;  Surgeon: Corky Crafts, MD;  Location: [facility] INVASIVE CV LAB;  Service: Cardiovascular;  Laterality: N/A;   CORONARY ULTRASOUND/IVUS N/A 08/20/2020   Procedure: Intravascular Ultrasound/IVUS;  Surgeon: Corky Crafts, MD;  Location: [facility] INVASIVE CV LAB;  Service: Cardiovascular;  Laterality: N/A;   CYSTOSCOPY N/A 04/23/2021   Procedure: CYSTOSCOPY;  Surgeon: Alfredo Martinez, MD;  Location: [facility] OR;  Service: Urology;  Laterality: N/A;   INCISION AND DRAINAGE ABSCESS N/A 04/23/2021   Procedure: INCISION AND DRAINAGE ABSCESS;  Surgeon: Alfredo Martinez, MD;  Location: [facility] OR;  Service: Urology;  Laterality: N/A;   LEFT HEART CATH AND CORONARY ANGIOGRAPHY N/A 08/20/2020   Procedure: LEFT HEART CATH AND CORONARY ANGIOGRAPHY;  Surgeon: Corky Crafts, MD;  Location: [facility] INVASIVE CV LAB;  Service: Cardiovascular;  Laterality: N/A;    Allergies  Allergies  Allergen Reactions   Heparin Other (See Comments)    Possible HIT, patient spent  a week in the ICU.   Pork-Derived Products Anaphylaxis    History of Present Illness    Greg Quinn has a PMH of lower extremity DVT, bilateral pulmonary embolus, unstable angina, coronary artery disease status post PCI, hypertension, hyperlipidemia, type 2 diabetes, obesity, and tobacco abuse.  He was last seen by Dr. Royann Shivers during his hospital admission on 08/21/2020.  He had noted chest pain and presented to the [facility] emergency department.  His chest pain subsided.  He returned to the emergency department after having worsening chest pain diaphoresis, and radiation to his jaw.  His high-sensitivity troponins were elevated.  He underwent cardiac catheterization 08/20/2020 and received PCI with DES to his mid circumflex.  He was also noted to have proximal and mid LAD 25% stenosis and distal RCA 40% stenosis.  His echocardiogram at that time showed normal LVEF and diastolic parameters.  He presented to the emergency department on 06/25/2022 and was discharged on 06/28/2022.  He reported chest pain and shortness of breath.  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  His shortness of breath had resolved.  His chest discomfort was noted to be in the center of his chest and radiating towards his right and left upper quadrants.  He denied fever, nausea vomiting diarrhea, cough, abdominal pain, dizziness, presyncope and palpitations.  His blood pressure was 138/71 and his pulse was noted to be 79.  He was noted to have right upper quadrant tenderness.  His EKG showed normal sinus rhythm septal infarct undetermined  age with no significant changes his symptoms were felt to be atypical.  His chest x-ray, cardiac troponins and EKG were reassuring.  His pain resolved.  He presents to the clinic today for follow-up evaluation and states***.  *** denies chest pain, shortness of breath, lower extremity edema, fatigue, palpitations, melena, hematuria,  hemoptysis, diaphoresis, weakness, presyncope, syncope, orthopnea, and PND.  Chest pain-no chest pain today.  Recent ED evaluation 06/25/2022.  Chest pain felt to be atypical.  Cardiac troponins low and flat.  EKG reassuring.  Previous cardiac catheterization 08/20/2020 with PCI and DES to his mid circumflex. Continue current medical therapy Heart healthy low-sodium diet Increase physical activity as tolerated  Essential hypertension-BP today***. Maintain blood pressure log Continue carvedilol Avoid secondary causes of hypertension  Hyperlipidemia-LDL*** High-fiber diet Continue atorvastatin  Disposition: Follow-up with Dr. Royann Shivers or me in 3-4 months.  Home Medications    Prior to Admission medications   Medication Sig Start Date End Date Taking? Authorizing Provider  Accu-Chek Softclix Lancets lancets Use up to 4 times daily as directed 05/01/21   Marguerita Merles Latif, DO  albuterol (VENTOLIN HFA) 108 (90 Base) MCG/ACT inhaler Inhale 1-2 puffs into the lungs 4 (four) times daily as needed for wheezing or shortness of breath. 05/01/21   Marguerita Merles Latif, DO  Ascorbic Acid (VITAMIN C) 1000 MG tablet Take 1,000 mg by mouth 2 (two) times daily.    [provider]  atorvastatin (LIPITOR) 40 MG tablet Take 1 tablet (40 mg total) by mouth daily. 05/21/22   [name], MD  Blood Glucose Monitoring Suppl (BLOOD GLUCOSE MONITOR SYSTEM) w/Device KIT Use as directed 05/01/21   Marguerita Merles Latif, DO  Blood Pressure Monitor DEVI Use to check blood pressure daily. I10.0 Hypertension 06/30/22   [name], MD  carvedilol (COREG) 3.125 MG tablet Take 1 tablet (3.125 mg total) by mouth 2 (two) times daily with a meal. 05/21/22   [name], MD  Continuous Blood Gluc Sensor (FREESTYLE LIBRE 3 SENSOR) MISC Place 1 sensor on the skin every 14 days. Use to check glucose continuously 01/11/22   [name], MD  glucose blood test strip Use up to 4 times daily 05/01/21    Marguerita Merles Latif, DO  insulin aspart (NOVOLOG FLEXPEN) 100 UNIT/ML FlexPen Inject 5 Units into the skin 3 (three) times daily with meals. 05/21/22   [name], MD  insulin glargine (LANTUS SOLOSTAR) 100 UNIT/ML Solostar Pen Inject 20 Units into the skin at bedtime. 05/21/22   [name], MD  Insulin Pen Needle (PEN NEEDLES) 31G X 8 MM MISC use as directed 05/21/22   [name], MD  Multiple Vitamin (MULTIVITAMIN WITH MINERALS) TABS tablet Take 1 tablet by mouth daily.    [provider]  nitroGLYCERIN (NITROSTAT) 0.4 MG SL tablet Place 1 tablet (0.4 mg total) under the tongue every 5 (five) minutes as needed for chest pain.Please schedule an appointment. 06/28/22   Croitoru, Mihai, MD    Family History    Family History  Problem Relation Age of Onset   Diabetes Father    He indicated that the status of his father is unknown.  Social History    Social History   Socioeconomic History   Marital status: Single    Spouse name: Not on file   Number of children: Not on file   Years of education: Not on file   Highest education level: Some college, no degree  Occupational History  Not on file  Tobacco Use   Smoking status: Former    Current packs/day: 0.00    Types: Cigarettes    Quit date: 04/22/2021    Years since quitting: 1.3   Smokeless tobacco: Never  Substance and Sexual Activity   Alcohol use: Yes    Comment: occasional   Drug use: Never   Sexual activity: Not on file  Other Topics Concern   Not on file  Social History Narrative   Not on file   Social Determinants of Health   Financial Resource Strain: High Risk (06/27/2022)   Overall Financial Resource Strain (CARDIA)    Difficulty of Paying Living Expenses: Hard  Food Insecurity: Food Insecurity Present (06/27/2022)   Hunger Vital Sign    Worried About Running Out of Food in the Last Year: Sometimes true    Ran Out of Food in the Last Year: Sometimes true  Transportation Needs: No  Transportation Needs (06/27/2022)   PRAPARE - Administrator, Civil Service (Medical): No    Lack of Transportation (Non-Medical): No  Physical Activity: Sufficiently Active (06/27/2022)   Exercise Vital Sign    Days of Exercise per Week: 4 days    Minutes of Exercise per Session: 40 min  Stress: No Stress Concern Present (06/27/2022)   [employer] of Occupational Health - Occupational Stress Questionnaire    Feeling of Stress : Only a little  Social Connections: Moderately Isolated (06/27/2022)   Social Connection and Isolation Panel [NHANES]    Frequency of Communication with Friends and Family: More than three times a week    Frequency of Social Gatherings with Friends and Family: Once a week    Attends Religious Services: 1 to 4 times per year    Active Member of [employer] or Organizations: No    Attends Engineer, structural: Not on file    Marital Status: Divorced  Intimate Partner Violence: Unknown (05/01/2021)   Received from [facility]   HITS    Physically Hurt: Not on file    Insult or Talk Down To: Not on file    Threaten Physical Harm: Not on file    Scream or Curse: Not on file     Review of Systems    General:  No chills, fever, night sweats or weight changes.  Cardiovascular:  No chest pain, dyspnea on exertion, edema, orthopnea, palpitations, paroxysmal nocturnal dyspnea. Dermatological: No rash, lesions/masses Respiratory: No cough, dyspnea Urologic: No hematuria, dysuria Abdominal:   No nausea, vomiting, diarrhea, bright red blood per rectum, melena, or hematemesis Neurologic:  No visual changes, wkns, changes in mental status. All other systems reviewed and are otherwise negative except as noted above.  Physical Exam    VS:  There were no vitals taken for this visit. , BMI There is no height or weight on file to calculate BMI. GEN: Well nourished, well developed, in no acute distress. HEENT: normal. Neck: Supple, no JVD, carotid bruits, or  masses. Cardiac: RRR, no murmurs, rubs, or gallops. No clubbing, cyanosis, edema.  Radials/DP/PT 2+ and equal bilaterally.  Respiratory:  Respirations regular and unlabored, clear to auscultation bilaterally. GI: Soft, nontender, nondistended, BS + x 4. MS: no deformity or atrophy. Skin: warm and dry, no rash. Neuro:  Strength and sensation are intact. Psych: Normal affect.  Accessory Clinical Findings    Recent Labs: 06/25/2022: ALT 21; BUN 10; Creatinine, Ser 0.85; Hemoglobin 15.0; Platelets 252; Potassium 4.1; Sodium 133   Recent Lipid Panel  Component Value Date/Time   CHOL 146 08/21/2020 0207   TRIG 57 08/21/2020 0207   HDL 42 08/21/2020 0207   CHOLHDL 3.5 08/21/2020 0207   VLDL 11 08/21/2020 0207   LDLCALC 93 08/21/2020 0207    No BP recorded.  {Refresh Note OR Click here to enter BP  :1}***    ECG personally reviewed by me today- ***      LHC on 08/20/20:     Mid Cx lesion is 90% stenosed.   Dist RCA lesion is 40% stenosed.   Prox LAD to Mid LAD lesion is 25% stenosed.   A drug-eluting stent was successfully placed using a STENT ONYX FRONTIER [id number], postdilated to 4.5 mm.   Post intervention, there is a 0% residual stenosis.   The left ventricular systolic function is normal.   LV end diastolic pressure is mildly elevated.   The left ventricular ejection fraction is 55-65% by visual estimate.   There is no aortic valve stenosis.   Recommend to resume Rivaroxaban, at currently prescribed dose and frequency on 08/21/2020. Recommend concurrent antiplatelet therapy of Aspirin 81 mg for 1 month and Clopidogrel 75mg  daily for 12 months.     Loading dose of clopidogrel ordered for tomorrow.  Brilinta was given at the time of PCI, but will need to be changed to clopidogrel due to Xarelto.     Aggressive secondary prevention including smoking cessation.     Diagnostic Dominance: Right      Intervention      _____________   Echo from 08/20/20:    1. Left  ventricular ejection fraction, by estimation, is 60 to 65%. The  left ventricle has normal function. The left ventricle has no regional  wall motion abnormalities. There is mild left ventricular hypertrophy.  Left ventricular diastolic parameters were normal.   2. Right ventricular systolic function is normal. The right ventricular  size is normal. Tricuspid regurgitation signal is inadequate for assessing  PA pressure.   3. The mitral valve is normal in structure. Trivial mitral valve  regurgitation. No evidence of mitral stenosis.   4. The aortic valve is tricuspid. Aortic valve regurgitation is not  visualized. No aortic stenosis is present.   5. The inferior vena cava is dilated in size with >50% respiratory  variability, suggesting right atrial pressure of 8 mmHg.    Assessment & Plan   1.  ***   Thomasene Ripple. Jenniffer Vessels NP-C     09/07/2022, 10:18 AM [facility] [address] Office [phone] Fax [phone]    I spent***minutes examining this patient, reviewing medications, and using patient centered shared decision making involving her cardiac care.  Prior to her visit I spent greater than 20 minutes reviewing her past medical history,  medications, and prior cardiac tests.

## 2022-09-16 ENCOUNTER — Encounter: Payer: Self-pay | Admitting: General Practice

## 2022-09-16 ENCOUNTER — Ambulatory Visit: Payer: 59 | Attending: General Practice | Admitting: General Practice

## 2022-09-20 ENCOUNTER — Other Ambulatory Visit: Payer: Self-pay

## 2022-09-22 ENCOUNTER — Other Ambulatory Visit: Payer: Self-pay

## 2022-09-28 ENCOUNTER — Other Ambulatory Visit: Payer: Self-pay

## 2022-10-01 ENCOUNTER — Other Ambulatory Visit: Payer: Self-pay

## 2022-10-05 ENCOUNTER — Other Ambulatory Visit: Payer: Self-pay

## 2022-10-26 ENCOUNTER — Emergency Department (HOSPITAL_COMMUNITY)
Admission: EM | Admit: 2022-10-26 | Discharge: 2022-10-27 | Disposition: A | Discharge status: Home / Self Care | Payer: 59 | Attending: Emergency Medicine | Admitting: Emergency Medicine

## 2022-10-26 ENCOUNTER — Other Ambulatory Visit: Payer: Self-pay

## 2022-10-26 DIAGNOSIS — G43809 Other migraine, not intractable, without status migrainosus: Secondary | ICD-10-CM | POA: Insufficient documentation

## 2022-10-26 DIAGNOSIS — Z794 Long term (current) use of insulin: Secondary | ICD-10-CM | POA: Insufficient documentation

## 2022-10-26 DIAGNOSIS — I10 Essential (primary) hypertension: Secondary | ICD-10-CM | POA: Insufficient documentation

## 2022-10-26 DIAGNOSIS — E1165 Type 2 diabetes mellitus with hyperglycemia: Secondary | ICD-10-CM | POA: Diagnosis not present

## 2022-10-26 DIAGNOSIS — Z79899 Other long term (current) drug therapy: Secondary | ICD-10-CM | POA: Insufficient documentation

## 2022-10-26 DIAGNOSIS — E871 Hypo-osmolality and hyponatremia: Secondary | ICD-10-CM | POA: Diagnosis not present

## 2022-10-26 DIAGNOSIS — R519 Headache, unspecified: Secondary | ICD-10-CM | POA: Diagnosis present

## 2022-10-26 LAB — BASIC METABOLIC PANEL
Anion gap: 12 (ref 5–15)
BUN: 15 mg/dL (ref 6–20)
CO2: 22 mmol/L (ref 22–32)
Calcium: 9 mg/dL (ref 8.9–10.3)
Chloride: 98 mmol/L (ref 98–111)
Creatinine, Ser: 0.93 mg/dL (ref 0.61–1.24)
GFR, Estimated: >60 mL/min (ref >60)
Glucose, Bld: 414 mg/dL — ABNORMAL HIGH (ref 70–99)
Potassium: 4.1 mmol/L (ref 3.5–5.1)
Sodium: 132 mmol/L — ABNORMAL LOW (ref 135–145)

## 2022-10-26 LAB — CBC
HCT: 44.1 % (ref 39.0–52.0)
Hemoglobin: 15.1 g/dL (ref 13.0–17.0)
MCH: 29.4 pg (ref 26.0–34.0)
MCHC: 34.2 g/dL (ref 30.0–36.0)
MCV: 86 fL (ref 80.0–100.0)
Platelets: 215 10*3/uL (ref 150–400)
RBC: 5.13 MIL/uL (ref 4.22–5.81)
RDW: 11.9 % (ref 11.5–15.5)
WBC: 7.9 10*3/uL (ref 4.0–10.5)
nRBC: 0 % (ref 0.0–0.2)

## 2022-10-26 NOTE — ED Triage Notes (Signed)
Pt arrives to ED c/o migraine x 1 day. Pt reports neck stiffness. Pt denies vision changes but endorses intermittent dizziness. Pt denies N/V. Pt w/ previous hx of migraines

## 2022-10-27 ENCOUNTER — Emergency Department (HOSPITAL_COMMUNITY): Payer: 59

## 2022-10-27 MED ORDER — KETOROLAC TROMETHAMINE 15 MG/ML IJ SOLN
15.0000 mg | Freq: Once | INTRAMUSCULAR | Status: AC
  Administered 2022-10-27: 15 mg via INTRAVENOUS
  Filled 2022-10-27: qty 1

## 2022-10-27 MED ORDER — PROCHLORPERAZINE EDISYLATE 10 MG/2ML IJ SOLN
10.0000 mg | Freq: Once | INTRAMUSCULAR | Status: AC
  Administered 2022-10-27: 10 mg via INTRAVENOUS
  Filled 2022-10-27: qty 2

## 2022-10-27 MED ORDER — DIPHENHYDRAMINE HCL 50 MG/ML IJ SOLN
12.5000 mg | Freq: Once | INTRAMUSCULAR | Status: AC
  Administered 2022-10-27: 12.5 mg via INTRAVENOUS
  Filled 2022-10-27: qty 1

## 2022-10-27 MED ORDER — SODIUM CHLORIDE 0.9 % IV BOLUS
1000.0000 mL | Freq: Once | INTRAVENOUS | Status: AC
  Administered 2022-10-27: 1000 mL via INTRAVENOUS

## 2022-10-27 NOTE — ED Provider Notes (Signed)
[name] Provider Note   CSN: [id number] Arrival date & time: 10/26/22  1941     History Chief Complaint  Patient presents with   Migraine    Greg Quinn is a 52 y.o. male. Patient with a history of T2DM, HTN, HLD, and migraines presents to the ED with concerns of a headache. States that headache began about 1 days ago without improvement or worsening. Has not taken any medications for his headache. Denies any nausea, vomiting, or vision changes. Endorses some intermittent dizziness but feels that this is resolving. No nausea, vomiting, or diarrhea. Denies fever, neck pain, or unilateral weakness or numbness in upper or lower extremities.   Migraine Associated symptoms include headaches.       Home Medications Prior to Admission medications   Medication Sig Start Date End Date Taking? Authorizing Provider  Accu-Chek Softclix Lancets lancets Use up to 4 times daily as directed 05/01/21   Marguerita Merles Latif, DO  albuterol (VENTOLIN HFA) 108 (90 Base) MCG/ACT inhaler Inhale 1-2 puffs into the lungs 4 (four) times daily as needed for wheezing or shortness of breath. 05/01/21   Marguerita Merles Latif, DO  Ascorbic Acid (VITAMIN C) 1000 MG tablet Take 1,000 mg by mouth 2 (two) times daily.    [provider]  atorvastatin (LIPITOR) 40 MG tablet Take 1 tablet (40 mg total) by mouth daily. 05/21/22   [name], MD  Blood Glucose Monitoring Suppl (BLOOD GLUCOSE MONITOR SYSTEM) w/Device KIT Use as directed 05/01/21   Marguerita Merles Latif, DO  Blood Pressure Monitor DEVI Use to check blood pressure daily. I10.0 Hypertension 06/30/22   [name], MD  carvedilol (COREG) 3.125 MG tablet Take 1 tablet (3.125 mg total) by mouth 2 (two) times daily with a meal. 05/21/22   [name], MD  Continuous Blood Gluc Sensor (FREESTYLE LIBRE 3 SENSOR) MISC Place 1 sensor on the skin every 14 days. Use to check glucose  continuously 01/11/22   [name], MD  glucose blood test strip Use up to 4 times daily 05/01/21   Marguerita Merles Latif, DO  insulin aspart (NOVOLOG FLEXPEN) 100 UNIT/ML FlexPen Inject 5 Units into the skin 3 (three) times daily with meals. 05/21/22   [name], MD  insulin glargine (LANTUS SOLOSTAR) 100 UNIT/ML Solostar Pen Inject 20 Units into the skin at bedtime. 05/21/22   [name], MD  Insulin Pen Needle (PEN NEEDLES) 31G X 8 MM MISC use as directed 05/21/22   [name], MD  Multiple Vitamin (MULTIVITAMIN WITH MINERALS) TABS tablet Take 1 tablet by mouth daily.    [provider]  nitroGLYCERIN (NITROSTAT) 0.4 MG SL tablet Place 1 tablet (0.4 mg total) under the tongue every 5 (five) minutes as needed for chest pain.Please schedule an appointment. 06/28/22   Croitoru, Mihai, MD      Allergies    Heparin and Pork-derived products    Review of Systems   Review of Systems  Neurological:  Positive for headaches.  All other systems reviewed and are negative.   Physical Exam Updated Vital Signs BP (!) 156/81   Pulse 78   Temp 97.8 F (36.6 C) (Oral)   Resp 18   Ht 5\' 10"  (1.778 m)   Wt 113.4 kg   SpO2 98%   BMI 35.87 kg/m  Physical Exam Vitals and nursing note reviewed.  Constitutional:      General: He is not in  acute distress.    Appearance: He is well-developed.  HENT:     Head: Normocephalic and atraumatic.  Eyes:     Conjunctiva/sclera: Conjunctivae normal.  Cardiovascular:     Rate and Rhythm: Normal rate and regular rhythm.     Heart sounds: No murmur heard. Pulmonary:     Effort: Pulmonary effort is normal. No respiratory distress.     Breath sounds: Normal breath sounds.  Abdominal:     Palpations: Abdomen is soft.     Tenderness: There is no abdominal tenderness.  Musculoskeletal:        General: No swelling.     Cervical back: Neck supple.  Neurological:     General: No focal deficit present.     Mental Status: He is  alert and oriented to person, place, and time. Mental status is at baseline.     Cranial Nerves: No cranial nerve deficit.     Motor: No weakness.     Coordination: Coordination normal.     Comments: CN II-XII intact.  Psychiatric:        Mood and Affect: Mood normal.     ED Results / Procedures / Treatments   Labs (all labs ordered are listed, but only abnormal results are displayed) Labs Reviewed  BASIC METABOLIC PANEL - Abnormal; Notable for the following components:      Result Value   Sodium 132 (*)    Glucose, Bld 414 (*)    All other components within normal limits  CBC    EKG None  Radiology CT Head Wo Contrast  Result Date: 10/27/2022 CLINICAL DATA:  Headache EXAM: CT HEAD WITHOUT CONTRAST TECHNIQUE: Contiguous axial images were obtained from the base of the skull through the vertex without intravenous contrast. RADIATION DOSE REDUCTION: This exam was performed according to the departmental dose-optimization program which includes automated exposure control, adjustment of the mA and/or kV according to patient size and/or use of iterative reconstruction technique. COMPARISON:  None Available. FINDINGS: Brain: No evidence of acute infarction, hemorrhage, hydrocephalus, extra-axial collection or mass lesion/mass effect. Vascular: No hyperdense vessel or unexpected calcification. Skull: Normal. Negative for fracture or focal lesion. Sinuses/Orbits: No acute finding. IMPRESSION: Normal head CT. Electronically Signed   By: Tiburcio Pea M.D.   On: 10/27/2022 04:27    Procedures Procedures   Medications Ordered in ED Medications  prochlorperazine (COMPAZINE) injection 10 mg (10 mg Intravenous Given 10/27/22 0503)  diphenhydrAMINE (BENADRYL) injection 12.5 mg (12.5 mg Intravenous Given 10/27/22 0503)  ketorolac (TORADOL) 15 MG/ML injection 15 mg (15 mg Intravenous Given 10/27/22 0503)  sodium chloride 0.9 % bolus 1,000 mL (1,000 mLs Intravenous New Bag/Given 10/27/22 0503)     ED Course/ Medical Decision Making/ A&P                               Medical Decision Making Amount and/or Complexity of Data Reviewed Labs: ordered. Radiology: ordered.  Risk Prescription drug management.   This patient presents to the ED for concern of headache.  Differential diagnosis includes migraine, SAH, meningitis, viral URI   Lab Tests:  I Ordered, and personally interpreted labs.  The pertinent results include: CBC unremarkable, BMP with mild hyponatremia at 132 and elevated glucose of 414   Imaging Studies ordered:  I ordered imaging studies including CT head I independently visualized and interpreted imaging which showed no acute abnormality I agree with the radiologist interpretation   Medicines ordered and prescription  drug management:  I ordered medication including fluids, Compazine, Toradol, Benadryl for migraine cocktail Reevaluation of the patient after these medicines showed that the patient improved I have reviewed the patients home medicines and have made adjustments as needed   Problem List / ED Course:  Patient presented to the emergency department concerns of a migraine for the last day.  States that this is been slightly worse than typical migraines and the location of the headache appears slightly different as he typically has migraines towards the frontal region of his head but current symptoms are in the posterior aspect of his head.  Denies any vision changes, feels, numbness, tingling.  Not currently on blood thinners but does take aspirin.  Reports has had some intermittent dizziness typically worsen with positional changes but will resolve.  Basic labs including CBC and BMP ordered from triage for evaluation of symptoms.  Will also order on CT imaging of head given slightly different presentation of migraine headache. Basic labs unremarkable with only mild hyponatremia and hyperglycemia at 414 noted. CT imaging also reassuring with no  acute findings.  Migraine cocktail administered including fluids, Compazine, Toradol, Benadryl. Reassess patient approximate 1 hour after migraine medications were administered he reports significant improvement in his headache.  Encourage patient to follow-up with primary care provider for further evaluation of symptoms and to discuss possibly restarting his migraine medication/rescue medications for home use.  Discussed strict return precautions with patient.  Patient agreeable and comfortable with plan for discharge home.  All questions answered prior to patient discharge. Patient discharged home in stable condition.   Final Clinical Impression(s) / ED Diagnoses Final diagnoses:  Other migraine without status migrainosus, not intractable    Rx / DC Orders ED Discharge Orders     None         Smitty Knudsen, PA-C 10/27/22 1610    Shon Baton, MD 10/28/22 [phone]

## 2022-10-27 NOTE — Discharge Instructions (Signed)
You are seen in the emergency department for a migraine headache.  Your labs and imaging were thankfully reassuring.  You are given medications for this headache which did appear to improve your symptoms.  Please reach out to your primary care provider for repeat evaluation and to discuss possibly restarting on migraine medications as needed.  If symptoms worsen, return to the emergency department.

## 2022-10-30 ENCOUNTER — Other Ambulatory Visit: Payer: Self-pay

## 2022-11-01 ENCOUNTER — Other Ambulatory Visit: Payer: Self-pay

## 2022-11-09 ENCOUNTER — Other Ambulatory Visit: Payer: Self-pay

## 2022-11-10 ENCOUNTER — Other Ambulatory Visit: Payer: Self-pay

## 2022-12-16 ENCOUNTER — Other Ambulatory Visit: Payer: Self-pay

## 2022-12-20 ENCOUNTER — Other Ambulatory Visit: Payer: Self-pay

## 2023-01-05 ENCOUNTER — Other Ambulatory Visit (HOSPITAL_COMMUNITY): Payer: Self-pay

## 2023-01-05 ENCOUNTER — Other Ambulatory Visit: Payer: Self-pay

## 2023-01-05 MED ORDER — SULFAMETHOXAZOLE-TRIMETHOPRIM 800-160 MG PO TABS
1.0000 | ORAL_TABLET | Freq: Two times a day (BID) | ORAL | 0 refills | Status: DC
  Filled 2023-01-05 (×2): qty 14, 7d supply, fill #0

## 2023-01-22 ENCOUNTER — Other Ambulatory Visit: Payer: Self-pay | Admitting: Internal Medicine

## 2023-01-22 DIAGNOSIS — I152 Hypertension secondary to endocrine disorders: Secondary | ICD-10-CM

## 2023-01-22 DIAGNOSIS — I2510 Atherosclerotic heart disease of native coronary artery without angina pectoris: Secondary | ICD-10-CM

## 2023-01-24 ENCOUNTER — Other Ambulatory Visit: Payer: Self-pay

## 2023-01-24 MED ORDER — CARVEDILOL 3.125 MG PO TABS
3.1250 mg | ORAL_TABLET | Freq: Two times a day (BID) | ORAL | 0 refills | Status: DC
  Filled 2023-01-24: qty 60, 30d supply, fill #0

## 2023-01-24 MED ORDER — ATORVASTATIN CALCIUM 40 MG PO TABS
40.0000 mg | ORAL_TABLET | Freq: Every day | ORAL | 0 refills | Status: DC
Start: 2023-01-24 — End: 2023-02-16
  Filled 2023-01-24: qty 30, 30d supply, fill #0

## 2023-01-28 ENCOUNTER — Other Ambulatory Visit: Payer: Self-pay

## 2023-01-31 ENCOUNTER — Emergency Department (HOSPITAL_COMMUNITY): Payer: 59

## 2023-01-31 ENCOUNTER — Emergency Department (HOSPITAL_COMMUNITY)
Admission: EM | Admit: 2023-01-31 | Discharge: 2023-01-31 | Disposition: A | Discharge status: Home / Self Care | Payer: 59 | Attending: Emergency Medicine | Admitting: Emergency Medicine

## 2023-01-31 ENCOUNTER — Other Ambulatory Visit: Payer: Self-pay

## 2023-01-31 DIAGNOSIS — Z87891 Personal history of nicotine dependence: Secondary | ICD-10-CM | POA: Diagnosis not present

## 2023-01-31 DIAGNOSIS — E119 Type 2 diabetes mellitus without complications: Secondary | ICD-10-CM | POA: Insufficient documentation

## 2023-01-31 DIAGNOSIS — M79672 Pain in left foot: Secondary | ICD-10-CM | POA: Diagnosis present

## 2023-01-31 DIAGNOSIS — I2510 Atherosclerotic heart disease of native coronary artery without angina pectoris: Secondary | ICD-10-CM | POA: Insufficient documentation

## 2023-01-31 DIAGNOSIS — I10 Essential (primary) hypertension: Secondary | ICD-10-CM | POA: Insufficient documentation

## 2023-01-31 DIAGNOSIS — Z955 Presence of coronary angioplasty implant and graft: Secondary | ICD-10-CM | POA: Diagnosis not present

## 2023-01-31 MED ORDER — ACETAMINOPHEN 500 MG PO TABS
1000.0000 mg | ORAL_TABLET | Freq: Once | ORAL | Status: AC
  Administered 2023-01-31: 1000 mg via ORAL
  Filled 2023-01-31: qty 2

## 2023-01-31 MED ORDER — GABAPENTIN 300 MG PO CAPS
300.0000 mg | ORAL_CAPSULE | Freq: Once | ORAL | Status: AC
  Administered 2023-01-31: 300 mg via ORAL
  Filled 2023-01-31: qty 1

## 2023-01-31 NOTE — Discharge Instructions (Addendum)
 You have been seen here in the emergency department for foot pain. We have obtained a full history, performed a physical exam, in addition to other diagnostic tests and treatments. Right now, we feel that you are safe for discharge from a medical perspective, and do not have an acute life threatening illness.   To do: 1.) Take all medications as prescribed.   2.) If anything changes, or you develop fevers, chills, inability to eat or drink, severe pain, new symptoms, return of symptoms, worsening of symptoms, or any other concerns, please call 911 or come back to the emergency department as soon as possible.   3.) Please make an appointment with your primary care doctor for a follow-up visit after being seen here in the emergency department.   4.)  Can take Tylenol  up to 4000 mg/day, this can be 1000 mg every 6 hours.  Thank you for allowing me to take care of you today. We hope that you feel better soon.

## 2023-01-31 NOTE — ED Triage Notes (Addendum)
 Pt. Stated, Ive had foot pain on my left foot. I went to UC they said it was Plantar Fasciitis and I also have an infrown toe nail . Most of pain is on the outer part.

## 2023-01-31 NOTE — ED Provider Triage Note (Signed)
 Emergency Medicine Provider Triage Evaluation Note  Greg Quinn , a 53 y.o. male  was evaluated in triage.  Pt complains of foot pain on the left, he has a ulcer on the posterior lateral aspect of his heel, has been on antibiotics, this is healing but he is concerned because it still an open area, also has his left great toe with a medial aspect ingrown nail with tenderness and swelling of the toe.  This is not getting any better, has seen urgent care and PCP and has not been referred to podiatry.  Review of Systems  Positive: Foot pain Negative: Fever  Physical Exam  BP (!) 149/92 (BP Location: Left Arm)   Pulse 95   Temp 98 F (36.7 C)   Resp (!) 23   SpO2 97%  Gen:   Awake, no distress   Resp:  Normal effort  MSK:   Moves extremities without difficulty, mild edema bilateral legs, ingrown toenail on the left, healing small wound to the left posterior lateral aspect of the foot near the heel.  About half a centimeter in length, no purulence no redness no tenderness Other:  Otherwise well-appearing, normal cardiac exam  Medical Decision Making  Medically screening exam initiated at 9:09 AM.  Appropriate orders placed.  Greg Quinn was informed that the remainder of the evaluation will be completed by another provider, this initial triage assessment does not replace that evaluation, and the importance of remaining in the ED until their evaluation is complete.  Ingrown nail, informed patient this could be handled at podiatrist, he does not have money for podiatrist and wishes for to be handled here.  He will go to the waiting room and await the next room   Cleotilde Rogue, MD 01/31/23 [phone]

## 2023-01-31 NOTE — ED Provider Notes (Signed)
 [place] EMERGENCY DEPARTMENT AT [facility] Provider Note  HPI   Greg Quinn is a 53 y.o. male patient with a PMHx of diabetes hypertension hyperlipidemia who is here to left foot pain.  Patient states that for the past 4 weeks or so, he has had a small left wound in the left lateral aspect of his dorsal aspect of his foot.  He was seen on 01/06/2023 for this issue just a few weeks ago at a urgent care, he said the wound was there, however through their chart review, it states that they were concerned for plantar fasciitis, and impacted left big toenail, and they did not really mention the wound.  However they did prescribe Bactrim  for the toenail for a week which the patient states he is finished.  He has had no fevers chills nausea vomiting or any other symptoms, he states that he is just continuing to have pain in this area.   ROS Negative except as per HPI   Medical Decision Making   Upon presentation, the patient is afebrile hemodynamically stable, and has a small wound left lateral aspect of his foot.  For this patient, we performed an x-ray shows no acute process, some mild degenerative changes.  I give the patient Tylenol  and gabapentin , he states his pain is much better.  For this patient, there is no redness tracking up the leg, there is no warmth, there is no swelling, there is just a very small area of skin breakdown in the left lateral aspect of the foot on the dorsal aspect, just inferior to his ankle.  There is some very superficial ulceration of this area and some mild tenderness to touch.  I am not clinically concerned for infection with no fevers no systemic signs, and based on my exam.  I do not think this patient requires any additional antibiotics at this time, we will send home with a work note for 2 days, and instructions to take Tylenol  up to 4000 mg/day, and using ice for symptomatic relief.  Will provide podiatry referral at this time.  I stressed  the importance of taking care of his feet to the patient, understands that podiatry is a very valuable tool for the patient, and he is going to call him first thing tomorrow morning.  Also, there is some mild tenderness of the big toe on the left side, this may have some mild impaction, I do not think this is emergent process and does not need to be handled now, he will be referred to podiatry for this.   Clinical Course as of 01/31/23 1742  Mon Jan 31, 2023  1557 T2DM, HTN, HLD [JL]    Clinical Course User Index [JL] Gaetano Pac, MD     At this time, I feel that the patient is medically cleared for discharge and have discussed this with my attending who agrees.  I discussed with the patient and/or family my overall assessment, including my physical exam, labs, imaging, other diagnostic tests, and therapeutics given.  All questions answered and understanding is expressed.  I have instructed to call PCP to establish an outpatient appointment after this ED visit, and necessary specialty follow up if needed. I gave strict return precautions to come back to the ED including fevers, chills, severe pain, worsening of symptoms, return of symptoms, new and concerning symptoms, inability to tolerate p.o. intake, among others. I specifically stated to return if symptoms worsen return   1. Left foot pain     @  DISPOSITION@  Rx / DC Orders ED Discharge Orders     None        Past Medical History:  Diagnosis Date   Bilateral pulmonary embolism (HCC)    2009, on xarelto , IVC filter   Coronary artery disease    Current every day smoker    25 pack year history   DVT, lower extremity (HCC)    2009   Hyperlipidemia LDL goal <70    Hypertension    NSTEMI (non-ST elevated myocardial infarction) (HCC) 08/20/2020   NSTEMI (non-ST elevated myocardial infarction) (HCC) 08/20/2020   Uncontrolled diabetes mellitus    Past Surgical History:  Procedure Laterality Date   CORONARY STENT INTERVENTION N/A  08/20/2020   Procedure: CORONARY STENT INTERVENTION;  Surgeon: Dann Candyce RAMAN, MD;  Location: [facility] INVASIVE CV LAB;  Service: Cardiovascular;  Laterality: N/A;   CORONARY ULTRASOUND/IVUS N/A 08/20/2020   Procedure: Intravascular Ultrasound/IVUS;  Surgeon: Dann Candyce RAMAN, MD;  Location: [facility] INVASIVE CV LAB;  Service: Cardiovascular;  Laterality: N/A;   CYSTOSCOPY N/A 04/23/2021   Procedure: CYSTOSCOPY;  Surgeon: Gaston Hamilton, MD;  Location: [facility] OR;  Service: Urology;  Laterality: N/A;   INCISION AND DRAINAGE ABSCESS N/A 04/23/2021   Procedure: INCISION AND DRAINAGE ABSCESS;  Surgeon: Gaston Hamilton, MD;  Location: [facility] OR;  Service: Urology;  Laterality: N/A;   LEFT HEART CATH AND CORONARY ANGIOGRAPHY N/A 08/20/2020   Procedure: LEFT HEART CATH AND CORONARY ANGIOGRAPHY;  Surgeon: Dann Candyce RAMAN, MD;  Location: [facility] INVASIVE CV LAB;  Service: Cardiovascular;  Laterality: N/A;   Family History  Problem Relation Age of Onset   Diabetes Father    Social History   Socioeconomic History   Marital status: Single    Spouse name: Not on file   Number of children: Not on file   Years of education: Not on file   Highest education level: Some college, no degree  Occupational History   Not on file  Tobacco Use   Smoking status: Former    Current packs/day: 0.00    Types: Cigarettes    Quit date: 04/22/2021    Years since quitting: 1.7   Smokeless tobacco: Never  Substance and Sexual Activity   Alcohol use: Yes    Comment: occasional   Drug use: Never   Sexual activity: Not on file  Other Topics Concern   Not on file  Social History Narrative   Not on file   Social Drivers of Health   Financial Resource Strain: High Risk (06/27/2022)   Overall Financial Resource Strain (CARDIA)    Difficulty of Paying Living Expenses: Hard  Food Insecurity: Food Insecurity Present (06/27/2022)   Hunger Vital Sign    Worried About Running Out of Food in the Last Year: Sometimes true    Ran  Out of Food in the Last Year: Sometimes true  Transportation Needs: No Transportation Needs (06/27/2022)   PRAPARE - Administrator, Civil Service (Medical): No    Lack of Transportation (Non-Medical): No  Physical Activity: Sufficiently Active (06/27/2022)   Exercise Vital Sign    Days of Exercise per Week: 4 days    Minutes of Exercise per Session: 40 min  Stress: No Stress Concern Present (06/27/2022)   [employer] of Occupational Health - Occupational Stress Questionnaire    Feeling of Stress : Only a little  Social Connections: Moderately Isolated (06/27/2022)   Social Connection and Isolation Panel [NHANES]    Frequency of Communication with Friends and Family: More  than three times a week    Frequency of Social Gatherings with Friends and Family: Once a week    Attends Religious Services: 1 to 4 times per year    Active Member of [employer] or Organizations: No    Attends Engineer, Structural: Not on file    Marital Status: Divorced  Intimate Partner Violence: Unknown (05/01/2021)   Received from [employer], [facility]   HITS    Physically Hurt: Not on file    Insult or Talk Down To: Not on file    Threaten Physical Harm: Not on file    Scream or Curse: Not on file     Physical Exam   Vitals:   01/31/23 0859 01/31/23 1358  BP: (!) 149/92 (!) 141/89  Pulse: 95 85  Resp: (!) 23 20  Temp: 98 F (36.7 C) 98.3 F (36.8 C)  SpO2: 97% 98%    Physical Exam Vitals and nursing note reviewed.  Constitutional:      General: He is not in acute distress.    Appearance: Normal appearance. He is well-developed. He is not ill-appearing or toxic-appearing.  HENT:     Head: Normocephalic and atraumatic.     Right Ear: External ear normal.     Left Ear: External ear normal.     Nose: Nose normal.     Mouth/Throat:     Mouth: Mucous membranes are moist.  Eyes:     Extraocular Movements: Extraocular movements intact.     Pupils: Pupils are equal, round, and  reactive to light.  Cardiovascular:     Rate and Rhythm: Normal rate.     Pulses: Normal pulses.  Pulmonary:     Effort: Pulmonary effort is normal. No respiratory distress.     Breath sounds: Normal breath sounds. No stridor. No wheezing, rhonchi or rales.  Abdominal:     Palpations: Abdomen is soft.     Tenderness: There is no abdominal tenderness. There is no right CVA tenderness or left CVA tenderness.  Musculoskeletal:        General: Normal range of motion.     Cervical back: Normal range of motion and neck supple.     Comments: Patient has 2+ DP and PT pulses bilaterally, there is a small area of skin breakdown in the left lower foot, inferior to the lateral malleolus, mildly tender.  There is also mild tenderness of the big toe on the same side.  There is no redness warmth or swelling fall small  Skin:    General: Skin is warm and dry.     Capillary Refill: Capillary refill takes less than 2 seconds.  Neurological:     General: No focal deficit present.     Mental Status: He is alert and oriented to person, place, and time. Mental status is at baseline.  Psychiatric:        Mood and Affect: Mood normal.     Procedures   If procedures were preformed on this patient, they are listed below:  Procedures  The patient was seen, evaluated, and treated in conjunction with the attending physician, who voiced agreement in the care provided.  Note generated using Dragon voice dictation software and may contain dictation errors. Please contact me for any clarification or with any questions.   Electronically signed by:  Fairy Kerby Revere, M.D. (PGY-2)    Revere Fairy, MD 01/31/23 1742    Tonia Chew, MD 01/31/23 [phone]

## 2023-02-16 ENCOUNTER — Ambulatory Visit: Payer: 59 | Attending: Physician Assistant | Admitting: Physician Assistant

## 2023-02-16 ENCOUNTER — Other Ambulatory Visit: Payer: Self-pay

## 2023-02-16 VITALS — BP 156/101 | Wt 261.8 lb

## 2023-02-16 DIAGNOSIS — I2510 Atherosclerotic heart disease of native coronary artery without angina pectoris: Secondary | ICD-10-CM | POA: Diagnosis not present

## 2023-02-16 DIAGNOSIS — Z794 Long term (current) use of insulin: Secondary | ICD-10-CM | POA: Diagnosis not present

## 2023-02-16 DIAGNOSIS — E1159 Type 2 diabetes mellitus with other circulatory complications: Secondary | ICD-10-CM

## 2023-02-16 DIAGNOSIS — E1165 Type 2 diabetes mellitus with hyperglycemia: Secondary | ICD-10-CM | POA: Diagnosis not present

## 2023-02-16 DIAGNOSIS — Z09 Encounter for follow-up examination after completed treatment for conditions other than malignant neoplasm: Secondary | ICD-10-CM

## 2023-02-16 DIAGNOSIS — L97501 Non-pressure chronic ulcer of other part of unspecified foot limited to breakdown of skin: Secondary | ICD-10-CM

## 2023-02-16 DIAGNOSIS — I152 Hypertension secondary to endocrine disorders: Secondary | ICD-10-CM

## 2023-02-16 DIAGNOSIS — Z91148 Patient's other noncompliance with medication regimen for other reason: Secondary | ICD-10-CM

## 2023-02-16 LAB — POCT GLYCOSYLATED HEMOGLOBIN (HGB A1C): HbA1c, POC (controlled diabetic range): 14 % — AB (ref 0.0–7.0)

## 2023-02-16 LAB — GLUCOSE, POCT (MANUAL RESULT ENTRY): POC Glucose: 267 mg/dL — AB (ref 70–99)

## 2023-02-16 MED ORDER — ALBUTEROL SULFATE HFA 108 (90 BASE) MCG/ACT IN AERS
1.0000 | INHALATION_SPRAY | Freq: Four times a day (QID) | RESPIRATORY_TRACT | 1 refills | Status: AC | PRN
  Filled 2023-02-16: qty 18, 17d supply, fill #0

## 2023-02-16 MED ORDER — MUPIROCIN 2 % EX OINT
1.0000 | TOPICAL_OINTMENT | Freq: Two times a day (BID) | CUTANEOUS | 0 refills | Status: AC
  Filled 2023-02-16: qty 22, 30d supply, fill #0

## 2023-02-16 MED ORDER — NOVOLOG FLEXPEN 100 UNIT/ML ~~LOC~~ SOPN
5.0000 [IU] | PEN_INJECTOR | Freq: Three times a day (TID) | SUBCUTANEOUS | 11 refills | Status: DC
  Filled 2023-02-16: qty 15, 100d supply, fill #0
  Filled 2023-05-06: qty 6, 40d supply, fill #0
  Filled 2023-06-16: qty 6, 40d supply, fill #1
  Filled 2023-08-10: qty 6, 40d supply, fill #2
  Filled 2023-11-04 (×2): qty 6, 30d supply, fill #2

## 2023-02-16 MED ORDER — LANTUS SOLOSTAR 100 UNIT/ML ~~LOC~~ SOPN
26.0000 [IU] | PEN_INJECTOR | Freq: Every day | SUBCUTANEOUS | 11 refills | Status: DC
  Filled 2023-02-16 – 2023-03-11 (×2): qty 15, 57d supply, fill #0
  Filled 2023-05-06: qty 9, 34d supply, fill #1
  Filled 2023-06-16: qty 9, 34d supply, fill #2
  Filled 2023-11-04: qty 6, 23d supply, fill #3

## 2023-02-16 MED ORDER — CARVEDILOL 3.125 MG PO TABS
3.1250 mg | ORAL_TABLET | Freq: Two times a day (BID) | ORAL | 3 refills | Status: DC
  Filled 2023-02-16 – 2023-03-11 (×2): qty 60, 30d supply, fill #0
  Filled 2023-05-06: qty 60, 30d supply, fill #1
  Filled 2023-06-16: qty 60, 30d supply, fill #2
  Filled 2023-08-10: qty 60, 30d supply, fill #3

## 2023-02-16 MED ORDER — ATORVASTATIN CALCIUM 40 MG PO TABS
40.0000 mg | ORAL_TABLET | Freq: Every day | ORAL | 1 refills | Status: DC
  Filled 2023-02-16 – 2023-03-11 (×2): qty 90, 90d supply, fill #0
  Filled 2023-06-16: qty 90, 90d supply, fill #1

## 2023-02-16 MED ORDER — PEN NEEDLES 31G X 8 MM MISC
6 refills | Status: AC
  Filled 2023-02-16 – 2023-03-11 (×2): qty 100, 25d supply, fill #0
  Filled 2023-05-06: qty 100, 25d supply, fill #1
  Filled 2023-06-16: qty 100, 25d supply, fill #2
  Filled 2023-08-10 – 2023-12-05 (×4): qty 100, 25d supply, fill #3
  Filled 2024-02-04: qty 100, 25d supply, fill #4

## 2023-02-16 NOTE — Progress Notes (Signed)
Patient ID: Greg Quinn, male   DOB: 21-Jun-1970, 53 y.o.   MRN: [id number]    Greg Quinn, is a 53 y.o. male  VZD:[id number]  [id number]  DOB - 07-Jan-1971  Chief Complaint  Patient presents with   Medical Management of Chronic Issues    Foot pain that goes all the way to the hip       Subjective:   Greg Quinn is a 53 y.o. male here today for follow up visit after being seen at ED for foot pain.  He has not made an appt with podiatry.  Says blood sugars at home running 140s-250.  Admits to poor compliance with novolog and occasionally forgets long acting too.  The ulcer area on his foot is getting better.  He is still having trouble with the ingrown nail.    From ED note: After being seen at the ED 01/31/2023 for L foot pain.  No osteomyelitis on xray.    Greg Quinn is a 53 y.o. male patient with a PMHx of diabetes hypertension hyperlipidemia who is here to left foot pain.  Patient states that for the past 4 weeks or so, he has had a small left wound in the left lateral aspect of his dorsal aspect of his foot.  He was seen on 01/06/2023 for this issue just a few weeks ago at a urgent care, he said the wound was there, however through their chart review, it states that they were concerned for plantar fasciitis, and impacted left big toenail, and they did not really mention the wound.  However they did prescribe Bactrim for the toenail for a week which the patient states he is finished.  He has had no fevers chills nausea vomiting or any other symptoms, he states that he is just continuing to have pain in this area.    For this patient, there is no redness tracking up the leg, there is no warmth, there is no swelling, there is just a very small area of skin breakdown in the left lateral aspect of the foot on the dorsal aspect, just inferior to his ankle. There is some very superficial ulceration of this area and some mild tenderness to touch. I am not  clinically concerned for infection with no fevers no systemic signs, and based on my exam. I do not think this patient requires any additional antibiotics at this time, we will send home with a work note for 2 days, and instructions to take Tylenol up to 4000 mg/day, and using ice for symptomatic relief. Will provide podiatry referral at this time. I stressed the importance of taking care of his feet to the patient, understands that podiatry is a very valuable tool for the patient, and he is going to call him first thing tomorrow morning. Also, there is some mild tenderness of the big toe on the left side, this may have some mild impaction, I do not think this is emergent process and does not need to be handled now, he will be referred to podiatry for this.  No problems updated.  ALLERGIES: Allergies  Allergen Reactions   Heparin Other (See Comments)    Possible HIT, patient spent a week in the ICU.   Pork-Derived Products Anaphylaxis    PAST MEDICAL HISTORY: Past Medical History:  Diagnosis Date   Bilateral pulmonary embolism (HCC)    2009, on xarelto, IVC filter   Coronary artery disease    Current every day smoker    25 pack year  history   DVT, lower extremity (HCC)    2009   Hyperlipidemia LDL goal <70    Hypertension    NSTEMI (non-ST elevated myocardial infarction) (HCC) 08/20/2020   NSTEMI (non-ST elevated myocardial infarction) (HCC) 08/20/2020   Uncontrolled diabetes mellitus     MEDICATIONS AT HOME: Prior to Admission medications   Medication Sig Start Date End Date Taking? Authorizing Provider  Accu-Chek Softclix Lancets lancets Use up to 4 times daily as directed 05/01/21  Yes Sheikh, Omair Latif, DO  Ascorbic Acid (VITAMIN C) 1000 MG tablet Take 1,000 mg by mouth 2 (two) times daily.   Yes [provider]  Blood Glucose Monitoring Suppl (BLOOD GLUCOSE MONITOR SYSTEM) w/Device KIT Use as directed 05/01/21  Yes Sheikh, Omair Latif, DO  Blood Pressure Monitor DEVI Use  to check blood pressure daily. I10.0 Hypertension 06/30/22  Yes [name], MD  Continuous Blood Gluc Sensor (FREESTYLE LIBRE 3 SENSOR) MISC Place 1 sensor on the skin every 14 days. Use to check glucose continuously 01/11/22  Yes [name], MD  glucose blood test strip Use up to 4 times daily 05/01/21  Yes Sheikh, Omair Latif, DO  Multiple Vitamin (MULTIVITAMIN WITH MINERALS) TABS tablet Take 1 tablet by mouth daily.   Yes [provider]  mupirocin ointment (BACTROBAN) 2 % Apply 1 Application topically 2 (two) times daily. 02/16/23  Yes Anders Simmonds, PA-C  nitroGLYCERIN (NITROSTAT) 0.4 MG SL tablet Place 1 tablet (0.4 mg total) under the tongue every 5 (five) minutes as needed for chest pain.Please schedule an appointment. 06/28/22  Yes Croitoru, Mihai, MD  albuterol (VENTOLIN HFA) 108 (90 Base) MCG/ACT inhaler Inhale 1-2 puffs into the lungs 4 (four) times daily as needed for wheezing or shortness of breath. 02/16/23   Anders Simmonds, PA-C  atorvastatin (LIPITOR) 40 MG tablet Take 1 tablet (40 mg total) by mouth daily. 02/16/23   Anders Simmonds, PA-C  carvedilol (COREG) 3.125 MG tablet Take 1 tablet (3.125 mg total) by mouth 2 (two) times daily with a meal. 02/16/23   Dionisios Ricci, Marzella Schlein, PA-C  insulin aspart (NOVOLOG FLEXPEN) 100 UNIT/ML FlexPen Inject 5 Units into the skin 3 (three) times daily with meals. 02/16/23   Anders Simmonds, PA-C  insulin glargine (LANTUS SOLOSTAR) 100 UNIT/ML Solostar Pen Inject 26 Units into the skin at bedtime. 02/16/23   Anders Simmonds, PA-C  Insulin Pen Needle (PEN NEEDLES) 31G X 8 MM MISC use as directed 02/16/23   Anders Simmonds, PA-C    ROS: Neg HEENT Neg resp Neg cardiac Neg GI Neg GU Neg MS Neg psych Neg neuro  Objective:   Vitals:   02/16/23 1337  BP: (!) 156/101  Weight: 261 lb 12.8 oz (118.8 kg)   Exam General appearance : Awake, alert, not in any distress. Speech Clear. Not toxic looking HEENT: Atraumatic and  Normocephalic Neck: Supple, no JVD. No cervical lymphadenopathy.  Chest: Good air entry bilaterally, CTAB.  No rales/rhonchi/wheezing CVS: S1 S2 regular, no murmurs.  L foot examined-lateral aspect of foot towards the back of the foot with small 1 cm ulceration without surrounding erythema.  Appears to be closing.  Good capillary RF.  He does have an ingrown nail on the L great toe without felon or paronychia.  No redness.  No discoloration of toes.  Appears to have nail fungus Extremities: B/L Lower Ext shows no edema, both legs are warm to touch Neurology: Awake alert, and oriented X 3, CN II-XII  intact, Non focal Skin: No Rash  Data Review Lab Results  Component Value Date   HGBA1C 14.0 (A) 02/16/2023   HGBA1C 15.0 (A) 05/21/2022   HGBA1C 12.8 (A) 01/11/2022    Assessment & Plan   1. Type 2 diabetes mellitus with hyperglycemia, with long-term current use of insulin (HCC) (Primary) Uncontrolled-encouraged timers/alarms to improve compliance.   - Glucose (CBG) - HgB A1c - insulin glargine (LANTUS SOLOSTAR) 100 UNIT/ML Solostar Pen; Inject 26 Units into the skin at bedtime.  Dispense: 15 mL; Refill: 11 - Insulin Pen Needle (PEN NEEDLES) 31G X 8 MM MISC; use as directed  Dispense: 100 each; Refill: 6 - insulin aspart (NOVOLOG FLEXPEN) 100 UNIT/ML FlexPen; Inject 5 Units into the skin 3 (three) times daily with meals.  Dispense: 15 mL; Refill: 11 - Ambulatory referral to Podiatry - Comprehensive metabolic panel Work at a goal of eliminating sugary drinks, candy, desserts, sweets, refined sugars, processed foods, and white carbohydrates.   Drink about 80 ounces water daily.   Check your blood sugars fasting and bedtime   2. Long term (current) use of insulin (HCC) - insulin glargine (LANTUS SOLOSTAR) 100 UNIT/ML Solostar Pen; Inject 26 Units into the skin at bedtime.  Dispense: 15 mL; Refill: 11 - insulin aspart (NOVOLOG FLEXPEN) 100 UNIT/ML FlexPen; Inject 5 Units into the skin 3  (three) times daily with meals.  Dispense: 15 mL; Refill: 11  3. Encounter for examination following treatment at hospital  4. Coronary artery disease involving native coronary artery of native heart without angina pectoris - carvedilol (COREG) 3.125 MG tablet; Take 1 tablet (3.125 mg total) by mouth 2 (two) times daily with a meal.  Dispense: 60 tablet; Refill: 3 - atorvastatin (LIPITOR) 40 MG tablet; Take 1 tablet (40 mg total) by mouth daily.  Dispense: 90 tablet; Refill: 1 - Comprehensive metabolic panel - Lipid Panel  5. Hypertension associated with diabetes (HCC) - carvedilol (COREG) 3.125 MG tablet; Take 1 tablet (3.125 mg total) by mouth 2 (two) times daily with a meal.  Dispense: 60 tablet; Refill: 3 - Comprehensive metabolic panel  6. Ulcer of foot, limited to breakdown of skin, unspecified laterality (HCC) Apply mupiricin to keep skin soft and free of infection - mupirocin ointment (BACTROBAN) 2 %; Apply 1 Application topically 2 (two) times daily.  Dispense: 22 g; Refill: 0 - Ambulatory referral to Podiatry  7. Poor compliance with medication Compliance imperative-encouraged timers/alarms, etc.  Assess for insurance for possibility of GLP-1    Return in about 4 weeks (around 03/16/2023) for Luke-for DM and BP;  3 months with PCP(Johnson).  The patient was given clear instructions to go to ER or return to medical center if symptoms don't improve, worsen or new problems develop. The patient verbalized understanding. The patient was told to call to get lab results if they haven't heard anything in the next week.      Georgian Co, PA-C [facility] and [facility] [place], [place] [phone]   02/16/2023, 2:00 PM

## 2023-02-16 NOTE — Patient Instructions (Signed)
Work at a goal of eliminating sugary drinks, candy, desserts, sweets, refined sugars, processed foods, and white carbohydrates.    Drink about 80 ounces water daily.    Check your blood sugars fasting and bedtime

## 2023-02-17 ENCOUNTER — Encounter: Payer: Self-pay | Admitting: Physician Assistant

## 2023-02-17 ENCOUNTER — Other Ambulatory Visit: Payer: Self-pay

## 2023-02-17 LAB — COMPREHENSIVE METABOLIC PANEL
ALT: 20 [IU]/L (ref 0–44)
AST: 16 [IU]/L (ref 0–40)
Albumin: 4.3 g/dL (ref 3.8–4.9)
Alkaline Phosphatase: 139 [IU]/L — ABNORMAL HIGH (ref 44–121)
BUN/Creatinine Ratio: 11 (ref 9–20)
BUN: 9 mg/dL (ref 6–24)
Bilirubin Total: 0.5 mg/dL (ref 0.0–1.2)
CO2: 23 mmol/L (ref 20–29)
Calcium: 9.6 mg/dL (ref 8.7–10.2)
Chloride: 101 mmol/L (ref 96–106)
Creatinine, Ser: 0.82 mg/dL (ref 0.76–1.27)
Globulin, Total: 2.9 g/dL (ref 1.5–4.5)
Glucose: 235 mg/dL — ABNORMAL HIGH (ref 70–99)
Potassium: 4.5 mmol/L (ref 3.5–5.2)
Sodium: 139 mmol/L (ref 134–144)
Total Protein: 7.2 g/dL (ref 6.0–8.5)
eGFR: 104 mL/min/{1.73_m2} (ref >59)

## 2023-02-17 LAB — LIPID PANEL
Chol/HDL Ratio: 3.3 {ratio} (ref 0.0–5.0)
Cholesterol, Total: 165 mg/dL (ref 100–199)
HDL: 50 mg/dL (ref >39)
LDL Chol Calc (NIH): 96 mg/dL (ref 0–99)
Triglycerides: 107 mg/dL (ref 0–149)
VLDL Cholesterol Cal: 19 mg/dL (ref 5–40)

## 2023-02-18 ENCOUNTER — Encounter: Payer: Self-pay | Admitting: Podiatry

## 2023-02-18 ENCOUNTER — Telehealth: Payer: Self-pay

## 2023-02-18 ENCOUNTER — Ambulatory Visit (INDEPENDENT_AMBULATORY_CARE_PROVIDER_SITE_OTHER): Payer: 59 | Admitting: Podiatry

## 2023-02-18 ENCOUNTER — Other Ambulatory Visit: Payer: Self-pay

## 2023-02-18 VITALS — Ht 70.5 in | Wt 261.0 lb

## 2023-02-18 DIAGNOSIS — E118 Type 2 diabetes mellitus with unspecified complications: Secondary | ICD-10-CM | POA: Diagnosis not present

## 2023-02-18 DIAGNOSIS — L97422 Non-pressure chronic ulcer of left heel and midfoot with fat layer exposed: Secondary | ICD-10-CM

## 2023-02-18 MED ORDER — DOXYCYCLINE HYCLATE 100 MG PO TABS
100.0000 mg | ORAL_TABLET | Freq: Two times a day (BID) | ORAL | 0 refills | Status: DC
  Filled 2023-02-18: qty 60, 30d supply, fill #0

## 2023-02-18 NOTE — Progress Notes (Unsigned)
Subjective:  Patient ID: Greg Quinn, male    DOB: Oct 09, 1970,  MRN: [id number]  Chief Complaint  Patient presents with   Foot Ulcer    He is here to establish for foot care" I have a spot on the heel area of my left foot that is kicking my butt and sore to touch, wearing a Band-Aid, Last A1C was 14 and medications have been changed"    53 y.o. male presents for wound care.  Patient presents with complaint of left heel ulceration hurts with ambulation worse with pressure.  Patient states that it is causing her a lot of pain she is gone last A1c of 14%.  It is sore to touch she has been wearing Band-Aid nothing has helped with his heel ulcer.  She is a diabetic   Review of Systems: Negative except as noted in the HPI. Denies N/V/F/Ch.  Past Medical History:  Diagnosis Date   Bilateral pulmonary embolism (HCC)    2009, on xarelto, IVC filter   Coronary artery disease    Current every day smoker    25 pack year history   DVT, lower extremity (HCC)    2009   Hyperlipidemia LDL goal <70    Hypertension    NSTEMI (non-ST elevated myocardial infarction) (HCC) 08/20/2020   NSTEMI (non-ST elevated myocardial infarction) (HCC) 08/20/2020   Uncontrolled diabetes mellitus     Current Outpatient Medications:    Accu-Chek Softclix Lancets lancets, Use up to 4 times daily as directed, Disp: 100 each, Rfl: 0   albuterol (VENTOLIN HFA) 108 (90 Base) MCG/ACT inhaler, Inhale 1-2 puffs into the lungs 4 (four) times daily as needed for wheezing or shortness of breath., Disp: 18 g, Rfl: 1   Ascorbic Acid (VITAMIN C) 1000 MG tablet, Take 1,000 mg by mouth 2 (two) times daily., Disp: , Rfl:    atorvastatin (LIPITOR) 40 MG tablet, Take 1 tablet (40 mg total) by mouth daily., Disp: 90 tablet, Rfl: 1   Blood Glucose Monitoring Suppl (BLOOD GLUCOSE MONITOR SYSTEM) w/Device KIT, Use as directed, Disp: 1 kit, Rfl: 0   Blood Pressure Monitor DEVI, Use to check blood pressure daily. I10.0  Hypertension, Disp: 1 each, Rfl: 0   carvedilol (COREG) 3.125 MG tablet, Take 1 tablet (3.125 mg total) by mouth 2 (two) times daily with a meal., Disp: 60 tablet, Rfl: 3   Continuous Blood Gluc Sensor (FREESTYLE LIBRE 3 SENSOR) MISC, Place 1 sensor on the skin every 14 days. Use to check glucose continuously, Disp: 2 each, Rfl: 6   doxycycline (VIBRA-TABS) 100 MG tablet, Take 1 tablet (100 mg total) by mouth 2 (two) times daily., Disp: 60 tablet, Rfl: 0   glucose blood test strip, Use up to 4 times daily, Disp: 100 each, Rfl: 0   insulin aspart (NOVOLOG FLEXPEN) 100 UNIT/ML FlexPen, Inject 5 Units into the skin 3 (three) times daily with meals., Disp: 15 mL, Rfl: 11   insulin glargine (LANTUS SOLOSTAR) 100 UNIT/ML Solostar Pen, Inject 26 Units into the skin at bedtime., Disp: 15 mL, Rfl: 11   Insulin Pen Needle (PEN NEEDLES) 31G X 8 MM MISC, use as directed, Disp: 100 each, Rfl: 6   Multiple Vitamin (MULTIVITAMIN WITH MINERALS) TABS tablet, Take 1 tablet by mouth daily., Disp: , Rfl:    mupirocin ointment (BACTROBAN) 2 %, Apply 1 Application topically 2 (two) times daily., Disp: 22 g, Rfl: 0   nitroGLYCERIN (NITROSTAT) 0.4 MG SL tablet, Place 1 tablet (0.4 mg total) under  the tongue every 5 (five) minutes as needed for chest pain.Please schedule an appointment., Disp: 25 tablet, Rfl: 1  Social History   Tobacco Use  Smoking Status Former   Current packs/day: 0.00   Types: Cigarettes   Quit date: 04/22/2021   Years since quitting: 1.8  Smokeless Tobacco Never    Allergies  Allergen Reactions   Heparin Other (See Comments)    Possible HIT, patient spent a week in the ICU.   Pork-Derived Products Anaphylaxis   Objective:  There were no vitals filed for this visit. Body mass index is 36.92 kg/m. Constitutional Well developed. Well nourished.  Vascular Dorsalis pedis pulses palpable bilaterally. Posterior tibial pulses palpable bilaterally. Capillary refill normal to all digits.  No  cyanosis or clubbing noted. Pedal hair growth normal.  Neurologic Normal speech. Oriented to person, place, and time. Protective sensation absent  Dermatologic Wound Location: Left lateral heel with fat layer exposed.  Does not probe down to bone mild erythema noted. Wound Base: Mixed Granular/Fibrotic Peri-wound: Calloused Exudate: Scant/small amount Serous exudate Wound Measurements: -See below  Orthopedic: No pain to palpation either foot.   Radiographs: None Assessment:   1. Heel ulcer, left, with fat layer exposed (HCC)   2. DM (diabetes mellitus), type 2 with complications [facility])    Plan:  Patient was evaluated and treated and all questions answered.  Ulcer left heel ulcer with fat layer exposed -Debridement as below. -Dressed with Betadine wet-to-dry, DSD. -Continue off-loading with surgical shoe.  Procedure: Excisional Debridement of Wound Tool: Sharp chisel blade/tissue nipper Rationale: Removal of non-viable soft tissue from the wound to promote healing.  Anesthesia: none Pre-Debridement Wound Measurements: 1 cm x 0.9 cm x 0.3 cm  Post-Debridement Wound Measurements: 1.3 cm x 1 cm x 0.3 cm  Type of Debridement: Sharp Excisional Tissue Removed: Non-viable soft tissue Blood loss: Minimal (<50cc) Depth of Debridement: subcutaneous tissue. Technique: Sharp excisional debridement to bleeding, viable wound base.  Wound Progress: This is initial evaluation of continue monitor progression of the wound Site healing conversation 7 Dressing: Dry, sterile, compression dressing. Disposition: Patient tolerated procedure well. Patient to return in 1 week for follow-up.  No follow-ups on file.

## 2023-02-18 NOTE — Telephone Encounter (Signed)
-----   Message from Georgian Co sent at 02/17/2023 12:06 PM EST ----- Your blood sugar is high as we knew.  Kidney, liver, electrolytes,cholesterol are good. Work at a goal of eliminating sugary drinks, candy, desserts, sweets, refined sugars, processed foods, and white carbohydrates.  Drink 64 to 80 ounces water daily.  Thanks, Georgian Co, PA-C

## 2023-02-18 NOTE — Telephone Encounter (Signed)
Patient viewed results and Doctor comment through [facility]

## 2023-03-11 ENCOUNTER — Ambulatory Visit: Payer: 59 | Admitting: Podiatry

## 2023-03-11 ENCOUNTER — Other Ambulatory Visit: Payer: Self-pay

## 2023-03-14 ENCOUNTER — Other Ambulatory Visit: Payer: Self-pay

## 2023-03-18 ENCOUNTER — Other Ambulatory Visit: Payer: Self-pay

## 2023-03-23 ENCOUNTER — Ambulatory Visit (INDEPENDENT_AMBULATORY_CARE_PROVIDER_SITE_OTHER): Payer: 59 | Admitting: Podiatry

## 2023-03-23 DIAGNOSIS — L97422 Non-pressure chronic ulcer of left heel and midfoot with fat layer exposed: Secondary | ICD-10-CM | POA: Diagnosis not present

## 2023-03-23 DIAGNOSIS — E118 Type 2 diabetes mellitus with unspecified complications: Secondary | ICD-10-CM

## 2023-03-23 NOTE — Progress Notes (Unsigned)
Getting better.

## 2023-04-12 IMAGING — CT CT ABD-PELV W/ CM
3 of 10 series · 11 of 46 positions shown, 17 images · IV contrast (Omni 300)
Comparison: CT abdomen pelvis 11/10/2020

CLINICAL DATA: Scrotal mass or lump scrotal abscess

EXAM:
CT ABDOMEN AND PELVIS WITH CONTRAST
TECHNIQUE: Multidetector CT imaging of the abdomen and pelvis was performed
using the standard protocol following bolus administration of
intravenous contrast.

[Series 5: thins · axial · 0.98mm/px · z∈[+900,+964]mm · 2 of 1223 slices shown (1 of 2)]
[im 129/1223  soft-tissue]
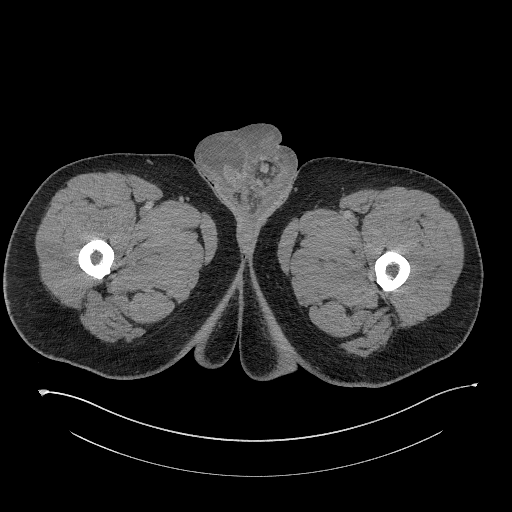
[im 258/1223  soft-tissue]
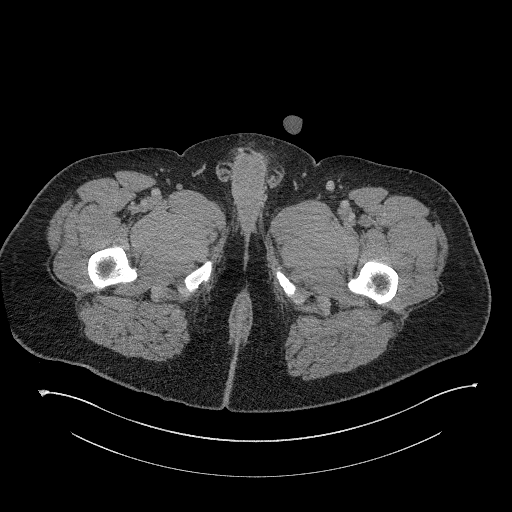

[Series 6: a/p w/ cor · coronal · 1.03mm/px · 3 of 189 slices shown, 4 images]
[im 38/189  soft-tissue]
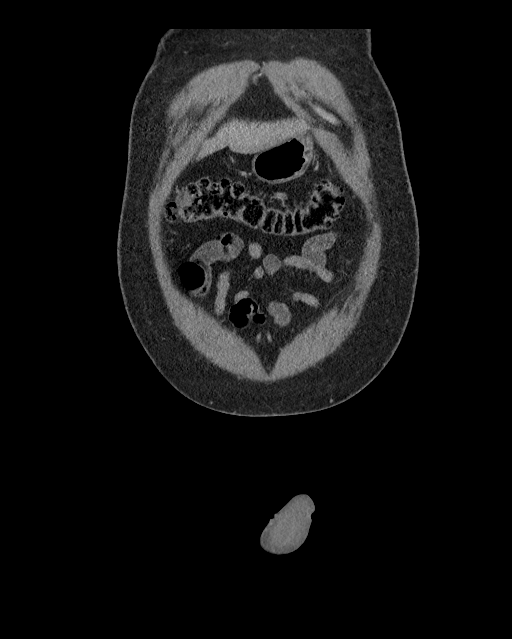
[im 76/189  soft-tissue]
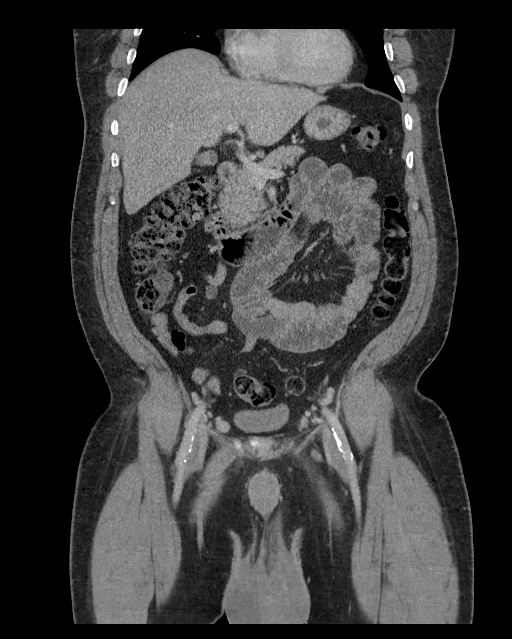
[im 76/189  bone]
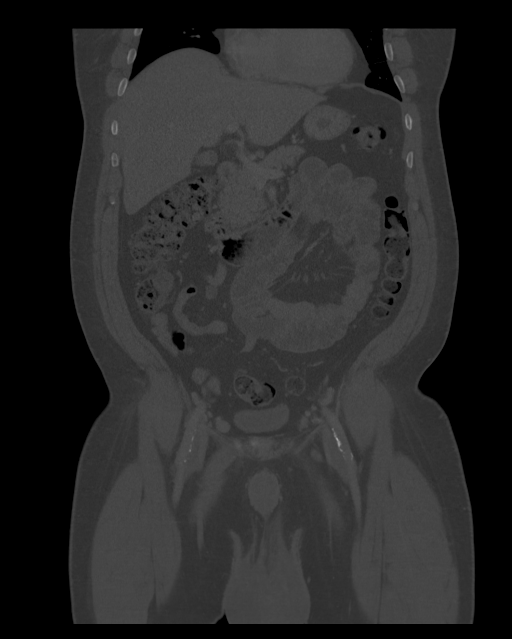
[im 113/189  soft-tissue]
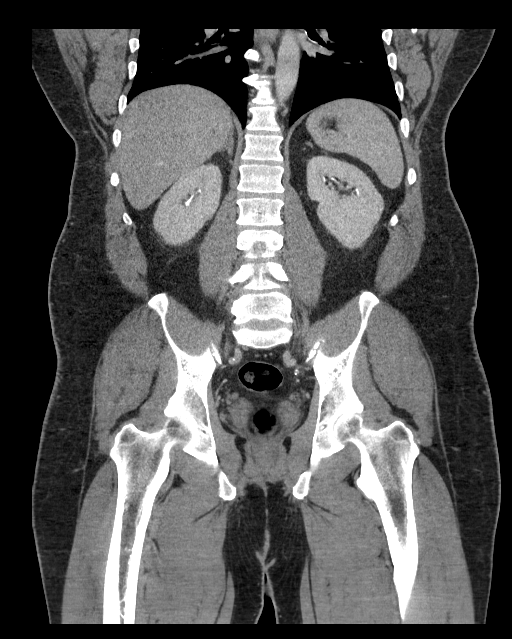

[Series 10: thins · axial · 0.98mm/px · z∈[+829,+994]mm · 6 of 463 slices shown, 11 images (2 of 2)]
[im 67/463  soft-tissue]
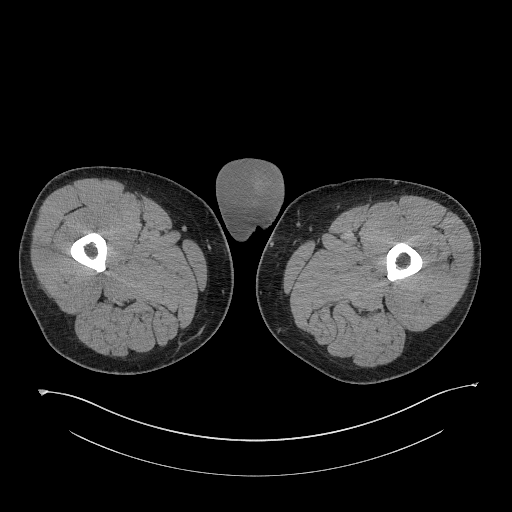
[im 67/463  bone]
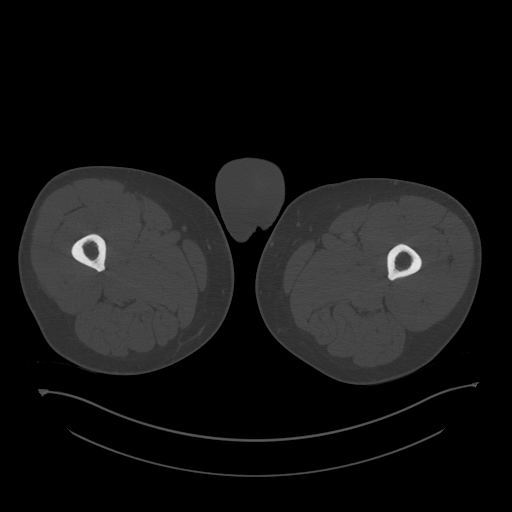
[im 133/463  soft-tissue]
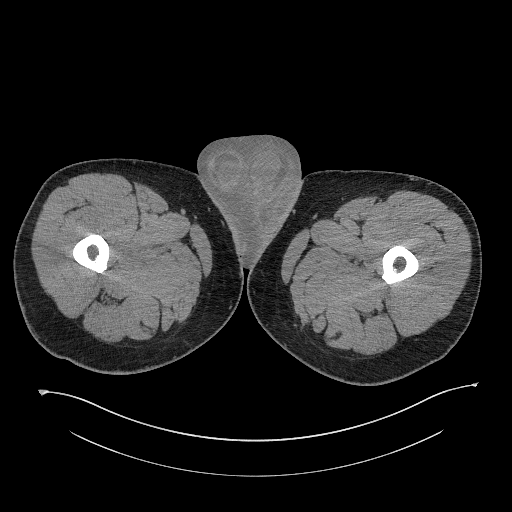
[im 199/463  soft-tissue]
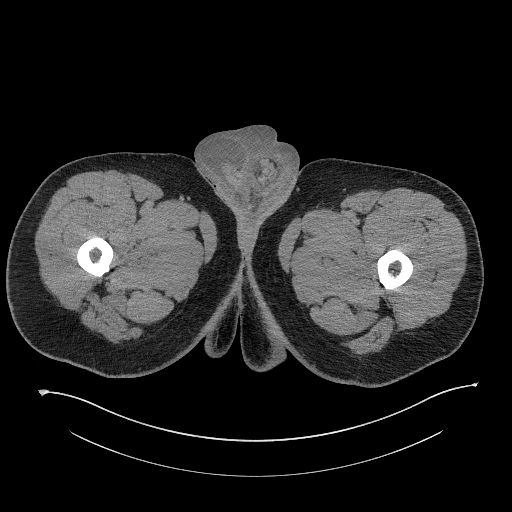
[im 199/463  lung]
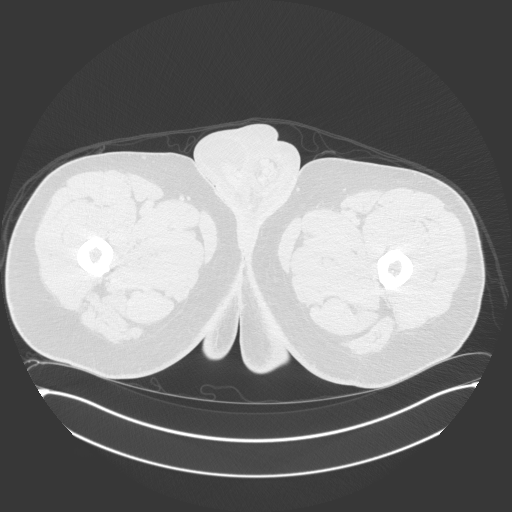
[im 265/463  soft-tissue]
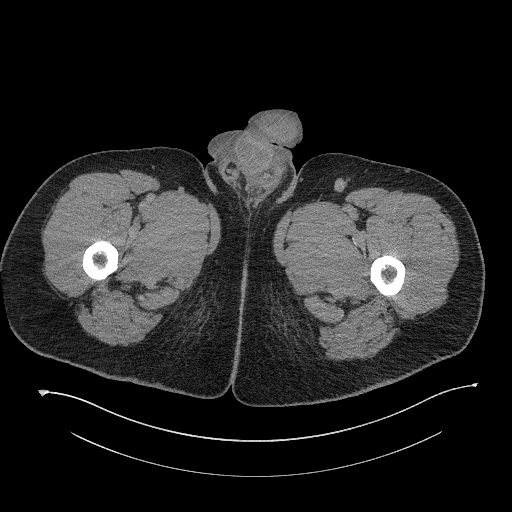
[im 265/463  lung]
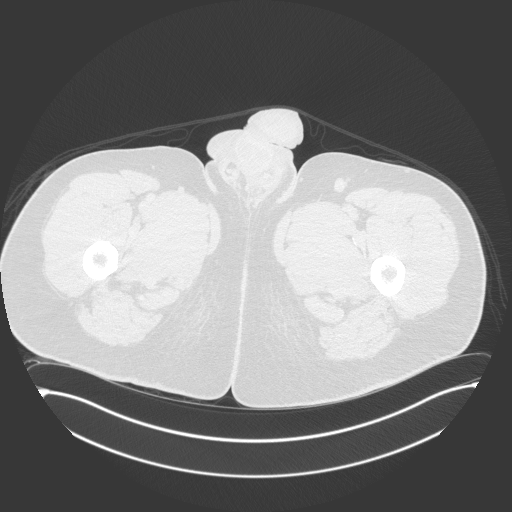
[im 331/463  soft-tissue]
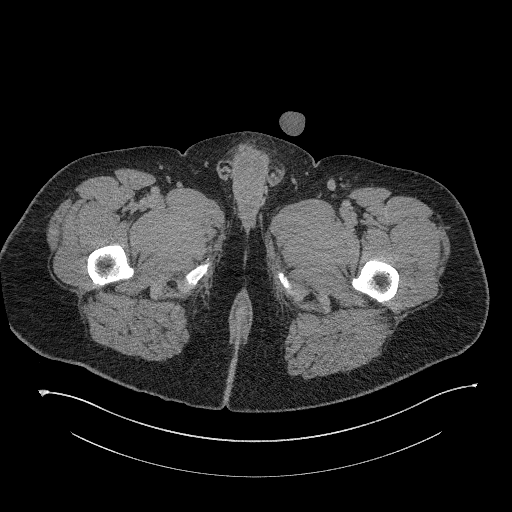
[im 331/463  lung]
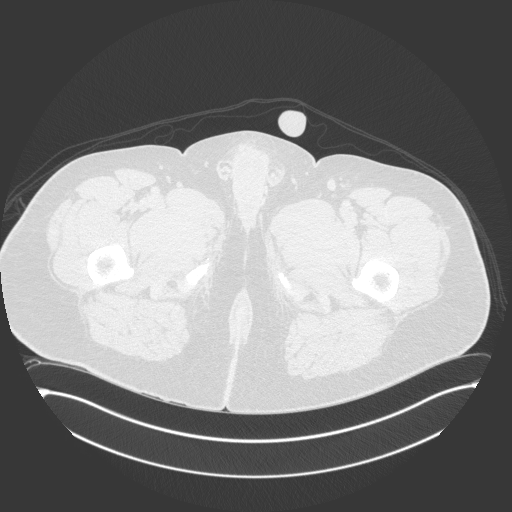
[im 397/463  soft-tissue]
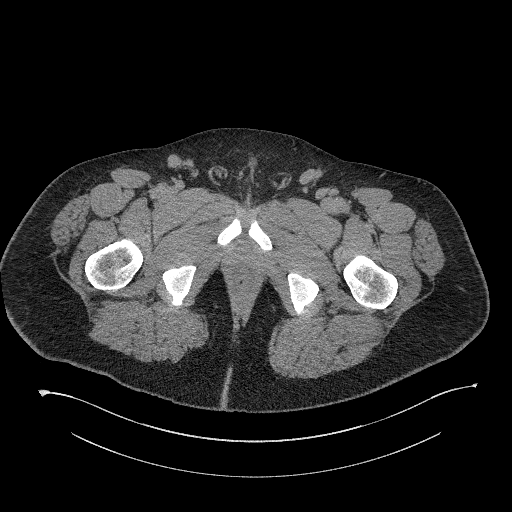
[im 397/463  lung]
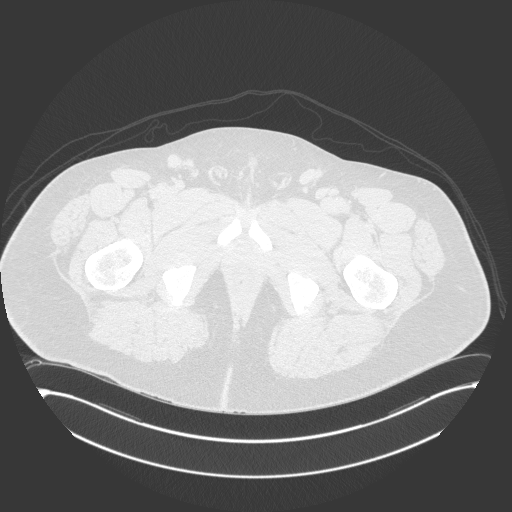

[11 of 46 positions shown; findings below may reference images not displayed]

RADIATION DOSE REDUCTION: This exam was performed according to the
departmental dose-optimization program which includes automated
exposure control, adjustment of the mA and/or kV according to
patient size and/or use of iterative reconstruction technique.

CONTRAST:  100mL OMNIPAQUE IOHEXOL 300 MG/ML  SOLN
FINDINGS: Lower chest: Coronary artery calcifications.  No acute abnormality.

Hepatobiliary: No focal liver abnormality. No gallstones,
gallbladder wall thickening, or pericholecystic fluid. No biliary
dilatation.

Pancreas: No focal lesion. Normal pancreatic contour. No surrounding
inflammatory changes. No main pancreatic ductal dilatation.

Spleen: Normal in size without focal abnormality.

Adrenals/Urinary Tract:

No adrenal nodule bilaterally.

Bilateral kidneys enhance symmetrically.

No hydronephrosis. No hydroureter.

The urinary bladder is unremarkable.

Stomach/Bowel: Stomach is within normal limits. No evidence of bowel
wall thickening or dilatation. Stool throughout the majority of the
colon. Appendix appears normal.

Vascular/Lymphatic: Inferior vena cava filter in grossly appropriate
position. All 4 distal legs are noted external to the inferior vena
cava. No abdominal aorta or iliac aneurysm. Moderate atherosclerotic
plaque of the aorta and its branches. No abdominal, pelvic, or
inguinal lymphadenopathy.

Reproductive: Prostate is unremarkable.  Bilateral small hydroceles.

Other: No intraperitoneal free fluid. No intraperitoneal free gas.
No organized fluid collection.

Musculoskeletal:

Subcutaneus soft tissue edema of the bilateral scrotum and of the
penis. No subcutaneus soft tissue emphysema.

No suspicious lytic or blastic osseous lesions. No acute displaced
fracture.
IMPRESSION: 1. No acute intra-abdominal or intrapelvic abnormality.
2. Subcutaneus soft tissue edema of the bilateral scrotum and penis.
No subcutaneus soft tissue emphysema. Correlate with signs symptoms
of infection/inflammation. Fournier gangrene not excluded as this is
a clinical diagnosis.
3. Inferior vena cava filter.
4.  Aortic Atherosclerosis ([id number]).

## 2023-04-19 IMAGING — DX DG FOOT COMPLETE 3+V*R*
3 series · 3 of 3 positions shown · non-contrast
Comparison: None.

CLINICAL DATA: Pain with weight-bearing.

EXAM:
RIGHT FOOT COMPLETE - 3+ VIEW

[foot ap]
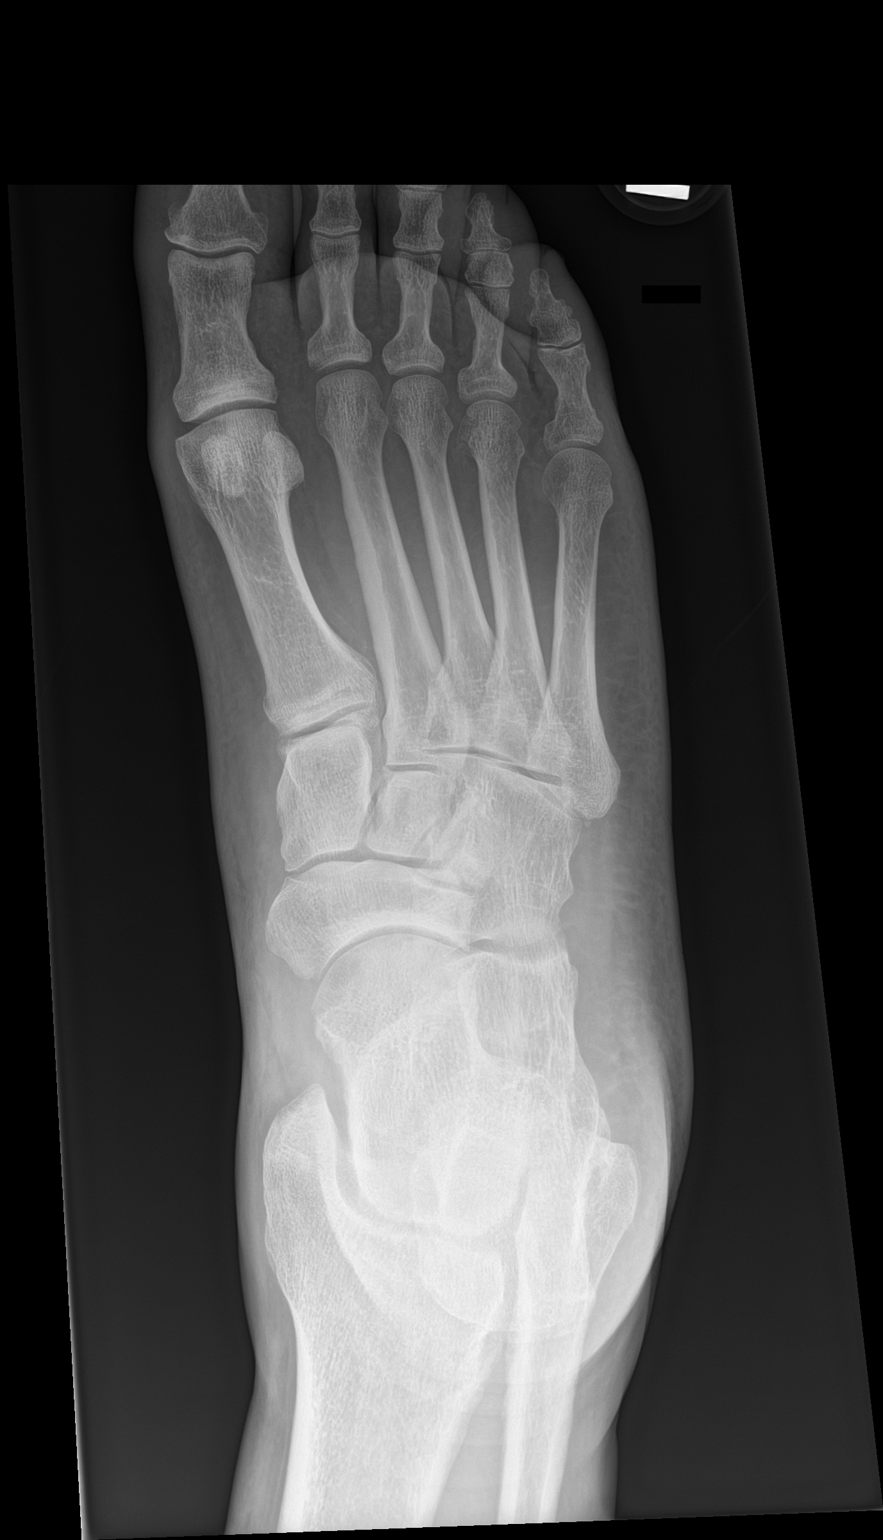

[foot obl]
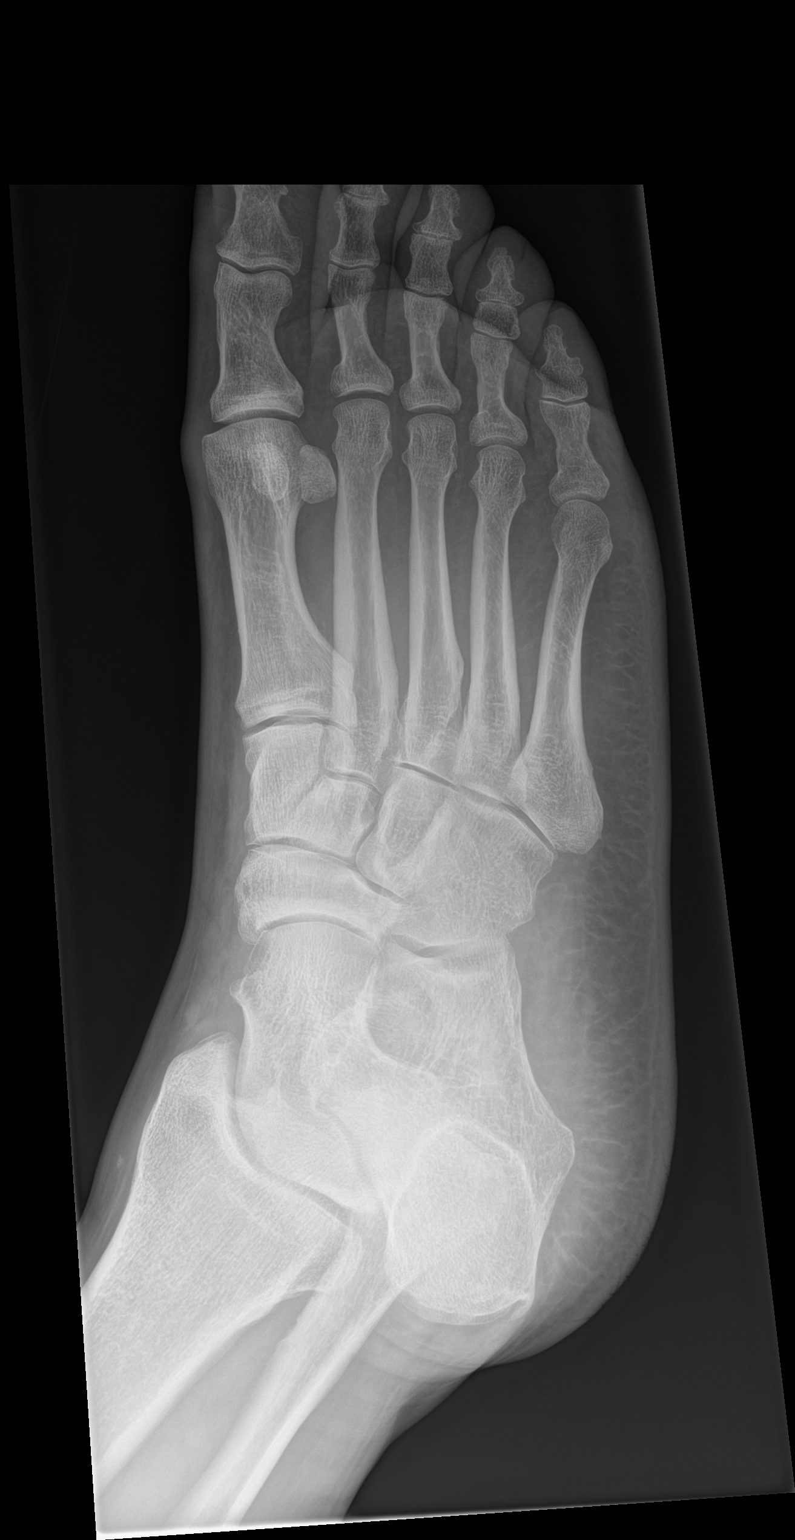

[foot lat]
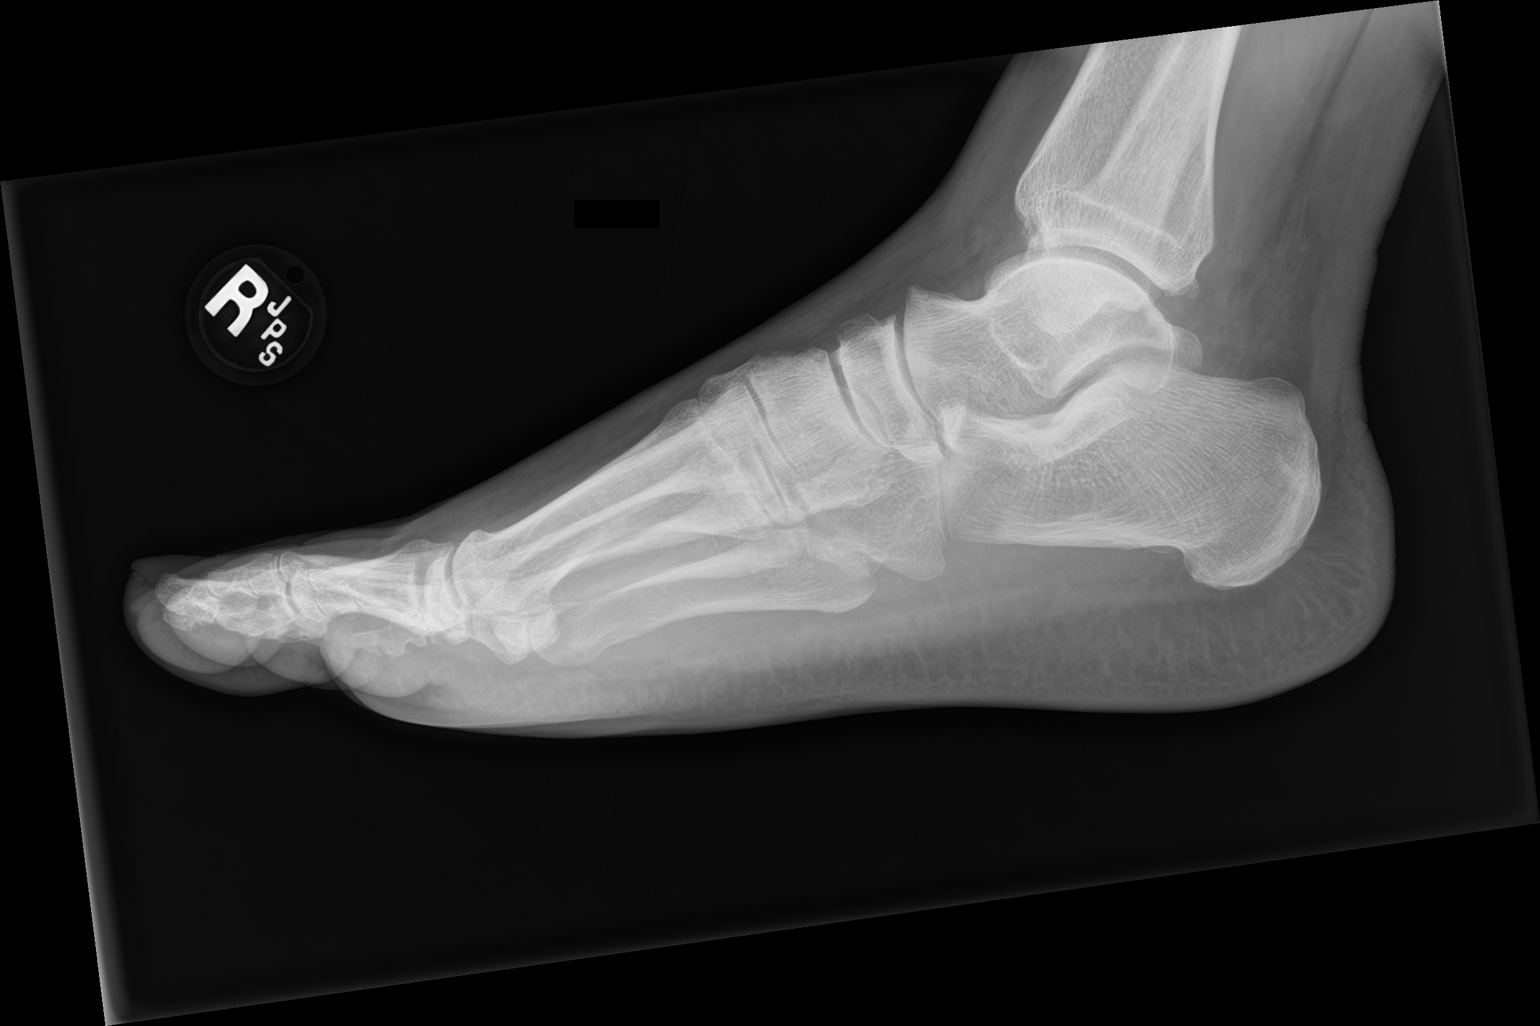

[3 of 3 positions shown; findings below may reference images not displayed]

FINDINGS: First MTP joint degenerative changes. Midfoot degenerative changes.
No acute fracture or dislocation. Regional soft tissues are
unremarkable.
IMPRESSION: No acute osseous abnormality.  Degenerative changes.

## 2023-04-20 ENCOUNTER — Ambulatory Visit: Payer: 59 | Admitting: Podiatry

## 2023-05-06 ENCOUNTER — Other Ambulatory Visit: Payer: Self-pay

## 2023-05-09 ENCOUNTER — Other Ambulatory Visit: Payer: Self-pay

## 2023-05-12 ENCOUNTER — Ambulatory Visit: Payer: Self-pay | Admitting: Podiatry

## 2023-06-16 ENCOUNTER — Other Ambulatory Visit: Payer: Self-pay

## 2023-06-17 ENCOUNTER — Other Ambulatory Visit: Payer: Self-pay

## 2023-06-21 ENCOUNTER — Other Ambulatory Visit: Payer: Self-pay

## 2023-06-27 ENCOUNTER — Other Ambulatory Visit: Payer: Self-pay

## 2023-08-11 ENCOUNTER — Other Ambulatory Visit: Payer: Self-pay

## 2023-08-15 ENCOUNTER — Other Ambulatory Visit: Payer: Self-pay

## 2023-08-19 ENCOUNTER — Other Ambulatory Visit: Payer: Self-pay

## 2023-11-04 ENCOUNTER — Other Ambulatory Visit: Payer: Self-pay

## 2023-11-04 ENCOUNTER — Other Ambulatory Visit: Payer: Self-pay | Admitting: Internal Medicine

## 2023-11-04 ENCOUNTER — Other Ambulatory Visit: Payer: Self-pay | Admitting: Pharmacist

## 2023-11-04 DIAGNOSIS — E1159 Type 2 diabetes mellitus with other circulatory complications: Secondary | ICD-10-CM

## 2023-11-04 DIAGNOSIS — I2510 Atherosclerotic heart disease of native coronary artery without angina pectoris: Secondary | ICD-10-CM

## 2023-11-04 MED ORDER — INSULIN LISPRO (1 UNIT DIAL) 100 UNIT/ML (KWIKPEN)
5.0000 [IU] | PEN_INJECTOR | Freq: Three times a day (TID) | SUBCUTANEOUS | 0 refills | Status: DC
  Filled 2023-11-04: qty 6, 40d supply, fill #0

## 2023-11-04 MED ORDER — CARVEDILOL 3.125 MG PO TABS
3.1250 mg | ORAL_TABLET | Freq: Two times a day (BID) | ORAL | 0 refills | Status: DC
  Filled 2023-11-04: qty 60, 30d supply, fill #0

## 2023-11-04 MED ORDER — ATORVASTATIN CALCIUM 40 MG PO TABS
40.0000 mg | ORAL_TABLET | Freq: Every day | ORAL | 0 refills | Status: DC
  Filled 2023-11-04: qty 30, 30d supply, fill #0

## 2023-11-09 ENCOUNTER — Encounter: Payer: Self-pay | Admitting: Physician Assistant

## 2023-11-09 ENCOUNTER — Ambulatory Visit: Payer: Self-pay | Admitting: Physician Assistant

## 2023-11-09 VITALS — BP 167/103 | HR 89 | Wt 251.0 lb

## 2023-11-09 DIAGNOSIS — Z794 Long term (current) use of insulin: Secondary | ICD-10-CM

## 2023-11-09 DIAGNOSIS — I10 Essential (primary) hypertension: Secondary | ICD-10-CM | POA: Diagnosis not present

## 2023-11-09 DIAGNOSIS — R195 Other fecal abnormalities: Secondary | ICD-10-CM

## 2023-11-09 DIAGNOSIS — H6122 Impacted cerumen, left ear: Secondary | ICD-10-CM | POA: Diagnosis not present

## 2023-11-09 DIAGNOSIS — E118 Type 2 diabetes mellitus with unspecified complications: Secondary | ICD-10-CM | POA: Diagnosis not present

## 2023-11-09 DIAGNOSIS — R748 Abnormal levels of other serum enzymes: Secondary | ICD-10-CM

## 2023-11-09 DIAGNOSIS — Z125 Encounter for screening for malignant neoplasm of prostate: Secondary | ICD-10-CM

## 2023-11-09 LAB — POCT GLYCOSYLATED HEMOGLOBIN (HGB A1C): HbA1c, POC (controlled diabetic range): 12.9 % — AB (ref 0.0–7.0)

## 2023-11-09 LAB — GLUCOSE, POCT (MANUAL RESULT ENTRY): POC Glucose: 287 mg/dL — AB (ref 70–99)

## 2023-11-09 NOTE — Patient Instructions (Addendum)
 VISIT SUMMARY:  During your visit, we discussed your hearing difficulty in the left ear, your diabetes management, high blood pressure, and the need for follow-up on a previous colorectal cancer screening. We have outlined a plan to address each of these issues.  YOUR PLAN:  -IMPACTED CERUMEN, LEFT EAR: You have earwax buildup in your left ear, which is causing hearing difficulty. We recommend using Debrox drops to soften the earwax and then irrigating your ear with warm water. If this does not help, continue with evaluation by an ear, nose, and throat specialist.  -TYPE 2 DIABETES MELLITUS, POORLY CONTROLLED: Your blood sugar levels have been high due to a lapse in your medication. It's important to continue taking your diabetes medication and reduce your sugar intake, especially in your coffee. We will also check your kidney function and liver enzymes with some lab tests.  -HYPERTENSION: Your blood pressure is high today, likely because you did not take your medication this morning. Please monitor your blood pressure daily until your next appointment with Dr. Vicci.  -ABNORMAL COLORECTAL CANCER SCREENING (POSITIVE COLOGUARD): You had a positive Cologuard test two years ago, which means there is a higher chance of colorectal cancer. It is important to follow up with a colonoscopy, so we will restart the referral process for you to see a gastroenterologist.  Earwax Buildup, Adult Your ears make something called earwax. It helps keep germs called bacteria away and protects the skin in your ears. Sometimes, too much earwax can build up. This can cause discomfort or make it harder to hear. What are the causes? Earwax buildup can happen when you have too much earwax in your ears. Earwax is made in the outer part of your ear canal. It's supposed to fall out in small amounts over time. But if your ears aren't able to clean themselves like they should, earwax can build up. What increases the  risk? You're more likely to get earwax buildup if: You clean your ears with cotton swabs. You pick at your ears. You use earplugs or in-ear headphones a lot. You wear hearing aids. You may also be more likely to get it if: You're male. You're older. Your ears naturally make more earwax. You have narrow ear canals or extra hair in your ears. Your earwax is too thick or sticky. You have eczema. You're dehydrated. This means there's not enough fluid in your body. What are the signs or symptoms? Symptoms of earwax buildup include: Not being able to hear as well. A feeling of fullness in your ear. Feeling like your ear is plugged. Fluid coming from your ear. Ear pain or an itchy ear. Ringing in your ear. Coughing or problems with balance. How is this diagnosed? Earwax buildup may be diagnosed based on your symptoms, medical history, and an ear exam. During the exam, your health care provider will look into your ear with a tool called an otoscope. You may also have tests, such as a hearing test. How is this treated? Earwax buildup may be treated by: Using ear drops. Having the earwax removed by a provider. The provider may: Flush the ear with water. Use a tool called a curette that has a loop on the end. Use a suction device. Having surgery. This may be done in severe cases. Follow these instructions at home:  Cleaning your ears Clean your ears as told by your provider. You can clean the outside of your ears with a washcloth or tissue. Do not overclean your ears. Do not  put anything into your ear unless told. This includes cotton swabs. General instructions Take over-the-counter and prescription medicines only as told by your provider. Drink enough fluid to keep your pee (urine) pale yellow. This helps thin the earwax. If you have hearing aids, clean them as told. Keep all follow-up visits. If earwax builds up in your ears often or if you use hearing aids, ask your provider how  often you should have your ears cleaned. Contact a health care provider if: Your ear pain gets worse. You have a fever. You have pus, blood, or other fluid coming from your ear. You have hearing loss. You have ringing in your ears that won't go away. You feel like the room is spinning. This is called vertigo. Your symptoms don't get better with treatment. This information is not intended to replace advice given to you by your health care provider. Make sure you discuss any questions you have with your health care provider. Document Revised: 03/25/2022 Document Reviewed: 03/25/2022 Elsevier Patient Education  2024 [employer].

## 2023-11-09 NOTE — Progress Notes (Unsigned)
 Established Patient Office Visit  Subjective   Patient ID: Greg Quinn, male    DOB: 1970/04/19  Age: 53 y.o. MRN: [id number]  Chief Complaint  Patient presents with   Ear Pain    Hard of hearing on left ear    Medication Refill   Discussed the use of AI scribe software for clinical note transcription with the patient, who gave verbal consent to proceed.  History of Present Illness   Greg Quinn is a 53 year old male who presents with hearing difficulty in the left ear.  He has experienced hearing difficulty in the left ear for about three weeks. Initially, he thought it was due to water in the ear, as it would occasionally 'pop' and improve temporarily. The issue persisted, and he began experiencing pain. The sensation is described as the ear feeling 'closed instead of open', with external sounds dampened while he can hear himself well. Self-care measures such as chewing gum, using peroxide, and warm water sprays have not alleviated the symptoms. He currently uses a cotton ball in the ear to reduce irritation and pain.  He has diabetes and was out of his medications for almost three months until around October 10th, when he was able to refill them. His most recent A1c was 12.9. He has resumed checking his blood sugars now that he has his medications back. He did not take his medications on the morning of the visit but has been taking his cholesterol and heart medications along with aspirin  regularly.      Past Medical History:  Diagnosis Date   Bilateral pulmonary embolism (HCC)    2009, on xarelto , IVC filter   Coronary artery disease    Current every day smoker    25 pack year history   DVT, lower extremity (HCC)    2009   Hyperlipidemia LDL goal <70    Hypertension    NSTEMI (non-ST elevated myocardial infarction) (HCC) 08/20/2020   NSTEMI (non-ST elevated myocardial infarction) (HCC) 08/20/2020   Uncontrolled diabetes mellitus    Social  History   Socioeconomic History   Marital status: Single    Spouse name: Not on file   Number of children: Not on file   Years of education: Not on file   Highest education level: Some college, no degree  Occupational History   Not on file  Tobacco Use   Smoking status: Former    Current packs/day: 0.00    Types: Cigarettes    Quit date: 04/22/2021    Years since quitting: 2.5   Smokeless tobacco: Never  Substance and Sexual Activity   Alcohol use: Yes    Comment: occasional   Drug use: Never   Sexual activity: Not on file  Other Topics Concern   Not on file  Social History Narrative   Not on file   Social Drivers of Health   Financial Resource Strain: Medium Risk (02/16/2023)   Overall Financial Resource Strain (CARDIA)    Difficulty of Paying Living Expenses: Somewhat hard  Food Insecurity: No Food Insecurity (02/16/2023)   Hunger Vital Sign    Worried About Running Out of Food in the Last Year: Never true    Ran Out of Food in the Last Year: Never true  Transportation Needs: No Transportation Needs (02/16/2023)   PRAPARE - Administrator, Civil Service (Medical): No    Lack of Transportation (Non-Medical): No  Physical Activity: Insufficiently Active (02/16/2023)   Exercise Vital Sign  Days of Exercise per Week: 1 day    Minutes of Exercise per Session: 20 min  Stress: No Stress Concern Present (02/16/2023)   [employer] of Occupational Health - Occupational Stress Questionnaire    Feeling of Stress : Only a little  Social Connections: Moderately Isolated (02/16/2023)   Social Connection and Isolation Panel    Frequency of Communication with Friends and Family: Three times a week    Frequency of Social Gatherings with Friends and Family: Three times a week    Attends Religious Services: More than 4 times per year    Active Member of Clubs or Organizations: No    Attends Banker Meetings: Never    Marital Status: Divorced  Careers information officer Violence: Unknown (05/01/2021)   Received from [facility]   HITS    Physically Hurt: Not on file    Insult or Talk Down To: Not on file    Threaten Physical Harm: Not on file    Scream or Curse: Not on file   Family History  Problem Relation Age of Onset   Diabetes Father    Allergies  Allergen Reactions   Heparin  Other (See Comments)    Possible HIT, patient spent a week in the ICU.   Porcine (Pork) Protein-Containing Drug Products Anaphylaxis    Review of Systems  Constitutional:  Negative for chills and fever.  HENT:  Positive for ear pain and hearing loss. Negative for ear discharge.   Eyes: Negative.   Respiratory:  Negative for shortness of breath.   Cardiovascular:  Negative for chest pain.  Gastrointestinal: Negative.   Genitourinary: Negative.   Musculoskeletal: Negative.   Skin: Negative.   Neurological: Negative.   Endo/Heme/Allergies: Negative.   Psychiatric/Behavioral: Negative.        Objective:     BP (!) 167/103 (BP Location: Left Arm, Patient Position: Sitting, Cuff Size: Large)   Pulse 89   Wt 251 lb (113.9 kg)   SpO2 98%   BMI 35.51 kg/m  BP Readings from Last 3 Encounters:  11/09/23 (!) 167/103  02/16/23 (!) 156/101  01/31/23 (!) 151/102   Wt Readings from Last 3 Encounters:  11/09/23 251 lb (113.9 kg)  02/18/23 261 lb (118.4 kg)  02/16/23 261 lb 12.8 oz (118.8 kg)    Physical Exam Vitals and nursing note reviewed.    GENERAL: Alert, cooperative, well developed, no acute distress. HEENT: Normocephalic, normal oropharynx, moist mucous membranes. Right ear without cerumen impaction. Left ear with cerumen impaction, tympanic membrane not visible. CHEST: Clear to auscultation bilaterally. No wheezes, rhonchi, or crackles. CARDIOVASCULAR: Normal heart rate and rhythm. S1 and S2 normal without murmurs. EXTREMITIES: No cyanosis or edema. NEUROLOGICAL: Cranial nerves grossly intact. Moves all extremities without gross motor or  sensory deficit.    Assessment & Plan:   Problem List Items Addressed This Visit       Cardiovascular and Mediastinum   Hypertension     Endocrine   DM (diabetes mellitus), type 2 with complications (HCC) - Primary   Relevant Orders   POCT glycosylated hemoglobin (Hb A1C) (Completed)   POCT glucose (manual entry) (Completed)   CBC with Differential/Platelet   Comp. Metabolic Panel (12)   Vitamin D, 25-hydroxy   Microalbumin / creatinine urine ratio (Completed)     Other   Positive colorectal cancer screening using Cologuard test   Relevant Orders   Ambulatory referral to Gastroenterology   Other Visit Diagnoses       Screening PSA (prostate  specific antigen)       Relevant Orders   PSA     Elevated blood pressure reading in office with diagnosis of hypertension         Elevated liver enzymes         Left ear impacted cerumen       Relevant Orders   Ambulatory referral to ENT       Results LABS A1c: 12.9  DIAGNOSTIC Cologuard: Positive  Assessment and Plan Impacted cerumen, left ear Impacted cerumen in the left ear causing hearing difficulty. No signs of infection. - Recommend Debrox drops for earwax softening and irrigation with warm water. - Start referral for ENT if home treatment is unsuccessful.  Type 2 diabetes mellitus, poorly controlled A1c elevated at 12.9% due to medication lapse. Resumed medication and dietary changes initiated. - Encourage continued adherence to diabetes medication. - Advise reducing sugar intake, particularly in coffee, and consider using zero-sugar creamers. - Order labs to check kidney function and liver enzymes.  Hypertension Blood pressure elevated at 167/103 mmHg. Medication not taken today. - Advise daily blood pressure monitoring until next appointment with Dr. Vicci. Red flags given for prompt reevaluation  Abnormal colorectal cancer screening (positive Cologuard) Positive Cologuard test two years ago without  follow-up colonoscopy.  - Restart referral for gastroenterology for colonoscopy.   I have reviewed the patient's medical history (PMH, PSH, Social History, Family History, Medications, and allergies) , and have been updated if relevant. I spent 30 minutes reviewing chart and  face to face time with patient.     Return if symptoms worsen or fail to improve.    Kirk RAMAN Mayers, PA-C

## 2023-11-10 ENCOUNTER — Ambulatory Visit: Payer: Self-pay | Admitting: Physician Assistant

## 2023-11-10 DIAGNOSIS — E559 Vitamin D deficiency, unspecified: Secondary | ICD-10-CM

## 2023-11-10 LAB — SPECIMEN STATUS REPORT

## 2023-11-10 LAB — MICROALBUMIN / CREATININE URINE RATIO
Creatinine, Urine: 75.8 mg/dL
Microalb/Creat Ratio: 115 mg/g{creat} — ABNORMAL HIGH (ref 0–29)
Microalbumin, Urine: 87.5 ug/mL

## 2023-11-11 LAB — CBC WITH DIFFERENTIAL/PLATELET
Basophils Absolute: 0 x10E3/uL (ref 0.0–0.2)
Basos: 0 %
EOS (ABSOLUTE): 0.1 x10E3/uL (ref 0.0–0.4)
Eos: 1 %
Hematocrit: 50.9 % (ref 37.5–51.0)
Hemoglobin: 16 g/dL (ref 13.0–17.7)
Immature Grans (Abs): 0 x10E3/uL (ref 0.0–0.1)
Immature Granulocytes: 0 %
Lymphocytes Absolute: 1.9 x10E3/uL (ref 0.7–3.1)
Lymphs: 25 %
MCH: 29.3 pg (ref 26.6–33.0)
MCHC: 31.4 g/dL — ABNORMAL LOW (ref 31.5–35.7)
MCV: 93 fL (ref 79–97)
Monocytes Absolute: 0.6 x10E3/uL (ref 0.1–0.9)
Monocytes: 8 %
Neutrophils Absolute: 5.1 x10E3/uL (ref 1.4–7.0)
Neutrophils: 66 %
Platelets: 247 x10E3/uL (ref 150–450)
RBC: 5.46 x10E6/uL (ref 4.14–5.80)
RDW: 12.8 % (ref 11.6–15.4)
WBC: 7.8 x10E3/uL (ref 3.4–10.8)

## 2023-11-11 LAB — COMP. METABOLIC PANEL (12)
AST: 14 IU/L (ref 0–40)
Albumin: 4.3 g/dL (ref 3.8–4.9)
Alkaline Phosphatase: 136 IU/L — ABNORMAL HIGH (ref 47–123)
BUN/Creatinine Ratio: 9 (ref 9–20)
BUN: 7 mg/dL (ref 6–24)
Bilirubin Total: 0.3 mg/dL (ref 0.0–1.2)
Calcium: 9.5 mg/dL (ref 8.7–10.2)
Chloride: 98 mmol/L (ref 96–106)
Creatinine, Ser: 0.77 mg/dL (ref 0.76–1.27)
Globulin, Total: 2.8 g/dL (ref 1.5–4.5)
Glucose: 285 mg/dL — ABNORMAL HIGH (ref 70–99)
Potassium: 4.4 mmol/L (ref 3.5–5.2)
Sodium: 136 mmol/L (ref 134–144)
Total Protein: 7.1 g/dL (ref 6.0–8.5)
eGFR: 106 mL/min/1.73 (ref >59)

## 2023-11-11 LAB — PSA: Prostate Specific Ag, Serum: 0.7 ng/mL (ref 0.0–4.0)

## 2023-11-11 LAB — VITAMIN D 25 HYDROXY (VIT D DEFICIENCY, FRACTURES): Vit D, 25-Hydroxy: 15.4 ng/mL — ABNORMAL LOW (ref 30.0–100.0)

## 2023-11-14 ENCOUNTER — Other Ambulatory Visit: Payer: Self-pay

## 2023-11-14 MED ORDER — VITAMIN D (ERGOCALCIFEROL) 1.25 MG (50000 UNIT) PO CAPS
50000.0000 [IU] | ORAL_CAPSULE | ORAL | 2 refills | Status: AC
  Filled 2023-11-14: qty 4, 28d supply, fill #0
  Filled 2023-12-20: qty 4, 28d supply, fill #1
  Filled 2024-02-04: qty 4, 28d supply, fill #2

## 2023-11-15 ENCOUNTER — Other Ambulatory Visit: Payer: Self-pay

## 2023-11-15 NOTE — Telephone Encounter (Signed)
 Patient viewed results and Doctor comment through [facility]

## 2023-11-21 ENCOUNTER — Other Ambulatory Visit: Payer: Self-pay

## 2023-11-21 ENCOUNTER — Ambulatory Visit: Payer: Self-pay | Attending: Internal Medicine | Admitting: Internal Medicine

## 2023-11-21 DIAGNOSIS — Z6836 Body mass index (BMI) 36.0-36.9, adult: Secondary | ICD-10-CM | POA: Diagnosis not present

## 2023-11-21 DIAGNOSIS — J069 Acute upper respiratory infection, unspecified: Secondary | ICD-10-CM

## 2023-11-21 DIAGNOSIS — I119 Hypertensive heart disease without heart failure: Secondary | ICD-10-CM

## 2023-11-21 DIAGNOSIS — I2510 Atherosclerotic heart disease of native coronary artery without angina pectoris: Secondary | ICD-10-CM

## 2023-11-21 DIAGNOSIS — E1169 Type 2 diabetes mellitus with other specified complication: Secondary | ICD-10-CM | POA: Diagnosis not present

## 2023-11-21 DIAGNOSIS — Z1211 Encounter for screening for malignant neoplasm of colon: Secondary | ICD-10-CM

## 2023-11-21 DIAGNOSIS — Z794 Long term (current) use of insulin: Secondary | ICD-10-CM

## 2023-11-21 DIAGNOSIS — I152 Hypertension secondary to endocrine disorders: Secondary | ICD-10-CM

## 2023-11-21 DIAGNOSIS — R195 Other fecal abnormalities: Secondary | ICD-10-CM

## 2023-11-21 DIAGNOSIS — E1159 Type 2 diabetes mellitus with other circulatory complications: Secondary | ICD-10-CM

## 2023-11-21 MED ORDER — CARVEDILOL 6.25 MG PO TABS
6.2500 mg | ORAL_TABLET | Freq: Two times a day (BID) | ORAL | 2 refills | Status: AC
Start: 2023-11-21 — End: ?
  Filled 2023-11-21: qty 180, 90d supply, fill #0

## 2023-11-21 MED ORDER — ACCU-CHEK GUIDE TEST VI STRP
ORAL_STRIP | 12 refills | Status: AC
  Filled 2023-11-21: qty 100, 30d supply, fill #0
  Filled 2023-12-20: qty 100, 30d supply, fill #1

## 2023-11-21 MED ORDER — ACCU-CHEK SOFTCLIX LANCETS MISC
12 refills | Status: AC
  Filled 2023-11-21: qty 100, 30d supply, fill #0
  Filled 2023-12-20: qty 100, 30d supply, fill #1

## 2023-11-21 MED ORDER — ATORVASTATIN CALCIUM 40 MG PO TABS
40.0000 mg | ORAL_TABLET | Freq: Every day | ORAL | 1 refills | Status: AC
  Filled 2023-11-21 – 2023-12-05 (×2): qty 90, 90d supply, fill #0

## 2023-11-21 MED ORDER — LANTUS SOLOSTAR 100 UNIT/ML ~~LOC~~ SOPN
26.0000 [IU] | PEN_INJECTOR | Freq: Every day | SUBCUTANEOUS | 11 refills | Status: DC
  Filled 2023-11-21 (×2): qty 15, 57d supply, fill #0

## 2023-11-21 MED ORDER — FREESTYLE LIBRE 3 READER DEVI
0 refills | Status: AC
  Filled 2023-11-21: qty 1, 30d supply, fill #0

## 2023-11-21 MED ORDER — FREESTYLE LIBRE 3 PLUS SENSOR MISC
11 refills | Status: AC
  Filled 2023-11-21: qty 2, 30d supply, fill #0
  Filled 2023-12-20: qty 2, 30d supply, fill #1

## 2023-11-21 MED ORDER — ACCU-CHEK GUIDE W/DEVICE KIT
PACK | 0 refills | Status: AC
  Filled 2023-11-21: qty 1, 30d supply, fill #0

## 2023-11-21 MED ORDER — INSULIN LISPRO (1 UNIT DIAL) 100 UNIT/ML (KWIKPEN)
5.0000 [IU] | PEN_INJECTOR | Freq: Three times a day (TID) | SUBCUTANEOUS | 2 refills | Status: DC
  Filled 2023-11-21 – 2023-12-05 (×4): qty 6, 40d supply, fill #0

## 2023-11-21 NOTE — Progress Notes (Signed)
 Patient ID: Greg Quinn, male    DOB: [birth year]  MRN: [id number]  CC: Hypertension (HTN & DM f/u. Med refill. /Cough, runny nose, headache, fatigue, eye pain X5 days/)   Subjective: Greg Quinn is a 53 y.o. male who presents for chronic ds management. His concerns today include:  Patient with history of DM type II, fournier's gangrene of the left scrotum 03/2021 HTN, CAD/NSTEMI (stent 07/2020), HL, tob dep, obese, 2x PE/1 DVT status post IVC filter (declined restarting Xarelto  01/11/2022).   Discussed the use of AI scribe software for clinical note transcription with the patient, who gave verbal consent to proceed.  History of Present Illness Greg Quinn is a 53 year old male with diabetes, hypertension, and heart disease who presents for follow-up of his chronic medical issues.  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 He saw our PAP at the mobile van unit on 11/09/2023 and was restarted on his meds. He is currently taking Lantus  insulin  26 units once daily and NovoLog  insulin  5 units with meals. He has not been checking his blood sugars due to running out of test strips and needing a new prescription for a glucose monitor. His last A1c was 12.9%. He is doing well with his eating habits, avoiding sugary drinks and limiting intake of white carbs.  HTN/CAD: He has a history of hypertension and heart disease and is currently taking carvedilol  3.125 mg twice daily, atorvastatin  40 mg, and aspirin . No chest pain or shortness of breath, except when coughing excessively. His leg swelling has improved since resuming his medications.  He is recovering from a head cold,  which has lasted five days, with symptoms including headache, eye pain, cough, and rhinorrhea. He has been managing these symptoms with Nyquil initially, followed by tea and rest. He reports experiencing night sweats at night.  He had a positive Cologuard test in 2022 and was referred to a gastroenterologist, but insurance issues prevented follow-up. He has no known family history of colon cancer. He also needs an eye exam and has not had his flu shot/PCV yet.    Patient Active Problem List   Diagnosis Date Noted   Former smoker 05/09/2021   Positive colorectal cancer screening using Cologuard test 05/09/2021   Obesity (BMI 30-39.9) 04/29/2021   Right foot pain 04/29/2021   Sepsis (HCC) 04/23/2021   CAD S/P percutaneous coronary angioplasty 04/23/2021   Scrotal infection 04/23/2021   Tobacco abuse 04/23/2021   DM (diabetes mellitus), type 2 with complications (HCC) 08/20/2020   Hypertension 08/20/2020   Hyperlipidemia LDL goal <70 08/20/2020   DVT, lower extremity (HCC) 08/20/2020   Current every day smoker 08/20/2020   Bilateral pulmonary embolism (HCC) 08/20/2020   Unstable angina (HCC) 08/20/2020     Current Outpatient Medications on File Prior to Visit  Medication Sig Dispense Refill   albuterol  (VENTOLIN  HFA) 108 (90 Base) MCG/ACT inhaler Inhale 1-2 puffs into the lungs 4 (four) times daily as needed for wheezing or shortness of breath. 18 g 1   Ascorbic Acid (VITAMIN C) 1000 MG tablet Take 1,000 mg by mouth 2 (two) times daily.     Blood Pressure Monitor DEVI Use  to check blood pressure daily. I10.0 Hypertension 1 each 0   Insulin  Pen Needle (PEN NEEDLES) 31G X 8 MM MISC use as directed 100 each 6   Multiple Vitamin (MULTIVITAMIN WITH MINERALS) TABS tablet Take 1 tablet by mouth daily.     mupirocin  ointment (BACTROBAN ) 2 % Apply 1 Application topically 2 (two) times daily. 22 g 0   nitroGLYCERIN  (NITROSTAT ) 0.4 MG SL tablet Place 1 tablet (0.4 mg total) under the tongue every  5 (five) minutes as needed for chest pain.Please schedule an appointment. 25 tablet 1   Vitamin D, Ergocalciferol, (DRISDOL) 1.25 MG (50000 UNIT) CAPS capsule Take 1 capsule (50,000 Units total) by mouth every 7 (seven) days. 4 capsule 2   No current facility-administered medications on file prior to visit.    Allergies  Allergen Reactions   Heparin  Other (See Comments)    Possible HIT, patient spent a week in the ICU.   Porcine (Pork) Protein-Containing Drug Products Anaphylaxis    Social History   Socioeconomic History   Marital status: Single    Spouse name: Not on file   Number of children: Not on file   Years of education: Not on file   Highest education level: Some college, no degree  Occupational History   Not on file  Tobacco Use   Smoking status: Former    Current packs/day: 0.00    Types: Cigarettes    Quit date: 04/22/2021    Years since quitting: 2.5   Smokeless tobacco: Never  Substance and Sexual Activity   Alcohol use: Yes    Comment: occasional   Drug use: Never   Sexual activity: Not on file  Other Topics Concern   Not on file  Social History Narrative   Not on file   Social Drivers of Health   Financial Resource Strain: Medium Risk (11/21/2023)   Overall Financial Resource Strain (CARDIA)    Difficulty of Paying Living Expenses: Somewhat hard  Food Insecurity: Food Insecurity Present (11/21/2023)   Hunger Vital Sign    Worried About Running Out of Food in the Last Year: Never true    Ran Out of Food in the Last Year: Sometimes true  Transportation Needs: Unmet Transportation Needs (11/21/2023)   PRAPARE - Transportation    Lack of Transportation (Medical): Yes    Lack of Transportation (Non-Medical): Yes  Physical Activity: Insufficiently Active (11/21/2023)   Exercise Vital Sign    Days of Exercise per Week: 2 days    Minutes of Exercise per Session: 10 min  Stress: No Stress Concern Present (11/21/2023)   [employer] of Occupational  Health - Occupational Stress Questionnaire    Feeling of Stress: Only a little  Social Connections: Moderately Isolated (11/21/2023)   Social Connection and Isolation Panel    Frequency of Communication with Friends and Family: More than three times a week    Frequency of Social Gatherings with Friends and Family: Twice a week    Attends Religious Services: 1 to 4 times per year    Active Member of [employer] or Organizations: No    Attends Engineer, Structural: Not on file    Marital Status: Divorced  Intimate Partner Violence: Unknown (05/01/2021)   Received from [facility]   HITS    Physically Hurt: Not on file    Insult or Talk Down To: Not on file    Threaten Physical Harm: Not on file    Scream or Curse: Not on file    Family  History  Problem Relation Age of Onset   Diabetes Father     Past Surgical History:  Procedure Laterality Date   CORONARY STENT INTERVENTION N/A 08/20/2020   Procedure: CORONARY STENT INTERVENTION;  Surgeon: Dann Candyce RAMAN, MD;  Location: [facility] INVASIVE CV LAB;  Service: Cardiovascular;  Laterality: N/A;   CORONARY ULTRASOUND/IVUS N/A 08/20/2020   Procedure: Intravascular Ultrasound/IVUS;  Surgeon: Dann Candyce RAMAN, MD;  Location: [facility] INVASIVE CV LAB;  Service: Cardiovascular;  Laterality: N/A;   CYSTOSCOPY N/A 04/23/2021   Procedure: CYSTOSCOPY;  Surgeon: Gaston Hamilton, MD;  Location: [facility] OR;  Service: Urology;  Laterality: N/A;   INCISION AND DRAINAGE ABSCESS N/A 04/23/2021   Procedure: INCISION AND DRAINAGE ABSCESS;  Surgeon: Gaston Hamilton, MD;  Location: [facility] OR;  Service: Urology;  Laterality: N/A;   LEFT HEART CATH AND CORONARY ANGIOGRAPHY N/A 08/20/2020   Procedure: LEFT HEART CATH AND CORONARY ANGIOGRAPHY;  Surgeon: Dann Candyce RAMAN, MD;  Location: [facility] INVASIVE CV LAB;  Service: Cardiovascular;  Laterality: N/A;    ROS: Review of Systems Negative except as stated above  PHYSICAL EXAM: BP (!) 144/84   Pulse 91   Temp 98 F  (36.7 C) (Oral)   Ht 5' 10 (1.778 m)   Wt 252 lb (114.3 kg)   SpO2 99%   BMI 36.16 kg/m   Physical Exam   General appearance - alert, well appearing, older male and in no distress Mental status - normal mood, behavior, speech, dress, motor activity, and thought processes Nose - normal and patent, no erythema, discharge or polyps Mouth - mucous membranes moist, pharynx normal without lesions Neck - supple, no significant adenopathy Chest - clear to auscultation, no wheezes, rales or rhonchi, symmetric air entry Heart - normal rate, regular rhythm, normal S1, S2, no murmurs, rubs, clicks or gallops Extremities - peripheral pulses normal, no pedal edema, no clubbing or cyanosis     Latest Ref Rng & Units 11/09/2023    6:20 PM 02/16/2023    2:25 PM 10/26/2022    9:34 PM  CMP  Glucose 70 - 99 mg/dL 714  764  585   BUN 6 - 24 mg/dL 7  9  15    Creatinine 0.76 - 1.27 mg/dL 9.22  9.17  9.06   Sodium 134 - 144 mmol/L 136  139  132   Potassium 3.5 - 5.2 mmol/L 4.4  4.5  4.1   Chloride 96 - 106 mmol/L 98  101  98   CO2 20 - 29 mmol/L  23  22   Calcium  8.7 - 10.2 mg/dL 9.5  9.6  9.0   Total Protein 6.0 - 8.5 g/dL 7.1  7.2    Total Bilirubin 0.0 - 1.2 mg/dL 0.3  0.5    Alkaline Phos 47 - 123 IU/L 136  139    AST 0 - 40 IU/L 14  16    ALT 0 - 44 IU/L  20     Lipid Panel     Component Value Date/Time   CHOL 165 02/16/2023 1425   TRIG 107 02/16/2023 1425   HDL 50 02/16/2023 1425   CHOLHDL 3.3 02/16/2023 1425   CHOLHDL 3.5 08/21/2020 0207   VLDL 11 08/21/2020 0207   LDLCALC 96 02/16/2023 1425    CBC    Component Value Date/Time   WBC 7.8 11/09/2023 1820   WBC 7.9 10/26/2022 2134   RBC 5.46 11/09/2023 1820   RBC 5.13 10/26/2022 2134   HGB 16.0 11/09/2023 1820   HCT  50.9 11/09/2023 1820   PLT 247 11/09/2023 1820   MCV 93 11/09/2023 1820   MCH 29.3 11/09/2023 1820   MCH 29.4 10/26/2022 2134   MCHC 31.4 (L) 11/09/2023 1820   MCHC 34.2 10/26/2022 2134   RDW 12.8 11/09/2023  1820   LYMPHSABS 1.9 11/09/2023 1820   MONOABS 0.8 10/25/2021 2328   EOSABS 0.1 11/09/2023 1820   BASOSABS 0.0 11/09/2023 1820    ASSESSMENT AND PLAN: 1. Type 2 diabetes mellitus associated with morbid obesity (HCC) (Primary) Most recent A1c was not at goal but up to that point patient had been out of his medications for several months which he restarted 2 weeks ago.  He does not have any blood sugar readings to share. Advised to continue Lantus  insulin  26 units daily and Humalog 5 units with meals. Prescription sent for a CGM and manual testing supplies in the event his insurance does not pay for CGM. Discussed and encouraged healthy eating habits.  Encouraged him to move is much as he can.  Follow-up with clinical pharmacist in several weeks for recheck.  Bring CGM reader or manual device with him to that visit. - insulin  lispro (HUMALOG KWIKPEN) 100 UNIT/ML KwikPen; Inject 5 Units into the skin 3 (three) times daily.  Dispense: 6 mL; Refill: 2 - Continuous Glucose Sensor (FREESTYLE LIBRE 3 PLUS SENSOR) MISC; Change sensor every 15 days.  Dispense: 2 each; Refill: 11 - Continuous Glucose Receiver (FREESTYLE LIBRE 3 READER) DEVI; UAD  Dispense: 1 each; Refill: 0 - Accu-Chek Softclix Lancets lancets; Use to check blood sugar 2 to 3 times a day before meals  Dispense: 100 each; Refill: 12 - Blood Glucose Monitoring Suppl (ACCU-CHEK GUIDE) w/Device KIT; Use to check Blood Sugar 2 to 3 times a day before meals  Dispense: 1 kit; Refill: 0 - glucose blood (ACCU-CHEK GUIDE TEST) test strip; Use to check Blood Sugar 2 to 3 times a day before meals  Dispense: 100 each; Refill: 12 - Ambulatory referral to Ophthalmology - insulin  glargine (LANTUS  SOLOSTAR) 100 UNIT/ML Solostar Pen; Inject 26 Units into the skin at bedtime.  Dispense: 15 mL; Refill: 11  2. Long term (current) use of insulin  (HCC) See #1 above  3. Hypertension associated with diabetes (HCC) Not at goal but repeat blood pressure reading  is better. Increase carvedilol  to 6.25 mg twice a day. - carvedilol  (COREG ) 6.25 MG tablet; Take 1 tablet (6.25 mg total) by mouth 2 (two) times daily with a meal.  Dispense: 180 tablet; Refill: 2  4. Coronary artery disease involving native coronary artery of native heart without angina pectoris Stable.  Continue atorvastatin , carvedilol  and over-the-counter aspirin . - atorvastatin  (LIPITOR ) 40 MG tablet; Take 1 tablet (40 mg total) by mouth daily.  Dispense: 90 tablet; Refill: 1 - carvedilol  (COREG ) 6.25 MG tablet; Take 1 tablet (6.25 mg total) by mouth 2 (two) times daily with a meal.  Dispense: 180 tablet; Refill: 2  5. Acute upper respiratory infection Most likely viral.  Recommend symptomatic treatment.  Can use Coricidin HBP or diabetic [place] over-the-counter as needed.  6. Positive colorectal cancer screening using Cologuard test - Ambulatory referral to Gastroenterology  Patient wants to get over this acute respiratory illness before getting any vaccines.  He is agreeable to returning in 1 week for nurse only visit for flu and pneumonia vaccines.\  Patient was given the opportunity to ask questions.  Patient verbalized understanding of the plan and was able to repeat key elements of the plan.  This documentation was completed using Paediatric nurse.  Any transcriptional errors are unintentional.  Orders Placed This Encounter  Procedures   Ambulatory referral to Gastroenterology   Ambulatory referral to Ophthalmology     Requested Prescriptions   Signed Prescriptions Disp Refills   atorvastatin  (LIPITOR ) 40 MG tablet 90 tablet 1    Sig: Take 1 tablet (40 mg total) by mouth daily.   carvedilol  (COREG ) 6.25 MG tablet 180 tablet 2    Sig: Take 1 tablet (6.25 mg total) by mouth 2 (two) times daily with a meal.   insulin  lispro (HUMALOG KWIKPEN) 100 UNIT/ML KwikPen 6 mL 2    Sig: Inject 5 Units into the skin 3 (three) times daily.   Continuous Glucose  Sensor (FREESTYLE LIBRE 3 PLUS SENSOR) MISC 2 each 11    Sig: Change sensor every 15 days.   Continuous Glucose Receiver (FREESTYLE LIBRE 3 READER) DEVI 1 each 0    Sig: Use as directed   Accu-Chek Softclix Lancets lancets 100 each 12    Sig: Use to check blood sugar 2 to 3 times a day before meals   Blood Glucose Monitoring Suppl (ACCU-CHEK GUIDE) w/Device KIT 1 kit 0    Sig: Use to check Blood Sugar 2 to 3 times a day before meals   glucose blood (ACCU-CHEK GUIDE TEST) test strip 100 each 12    Sig: Use to check Blood Sugar 2 to 3 times a day before meals   insulin  glargine (LANTUS  SOLOSTAR) 100 UNIT/ML Solostar Pen 15 mL 11    Sig: Inject 26 Units into the skin at bedtime.    Return in about 4 months (around 03/23/2024) for RN 1 wk for Flu/ PCV 20 vaccines. Appt Luke in 5 wks for DM.  Barnie Louder, MD, FACP

## 2023-11-21 NOTE — Patient Instructions (Addendum)
 Sent Referral to [facility] - [place] [address] [place], [place] [address]  [phone] Fax [phone]   VISIT SUMMARY: Today, we reviewed your chronic conditions, including diabetes, hypertension, and heart disease, and discussed your recent head cold. We also addressed your need for follow-up on a positive Cologuard test and general health maintenance.  YOUR PLAN: -TYPE 2 DIABETES MELLITUS: Type 2 diabetes is a chronic condition that affects the way your body processes blood sugar. Your A1c level is currently 12.9%, indicating poor blood sugar control. We have prescribed a continuous glucose monitor and manual test strips for you to check your blood sugar at least twice daily. Please continue with your insulin  therapy and make dietary changes to reduce sugar intake.  -HYPERTENSION: Hypertension, or high blood pressure, is a chronic condition that can lead to serious health issues if not managed properly. Your current blood pressure is 161/85 mmHg. We have increased your carvedilol  dose to 6.25 mg twice daily and recommend that you monitor your blood pressure regularly.  -HYPERLIPIDEMIA: Hyperlipidemia is a condition where there are high levels of fats (lipids) in your blood. You are currently taking atorvastatin  40 mg daily. We have ensured that your prescription is for a 60-month supply.  -CORONARY ARTERY DISEASE: Coronary artery disease is a condition where the blood vessels supplying your heart are narrowed or blocked. You are on aspirin  therapy and have experienced reduced leg swelling. Please continue with your current aspirin  therapy and monitor for any symptoms of chest pain or shortness of breath.  -VIRAL UPPER RESPIRATORY INFECTION: A viral upper respiratory infection is a common cold. Your symptoms are improving, and we recommend conservative management with over-the-counter cough syrup like Diabetic Tussin or  Coricidin HBP, along with rest.  -GENERAL HEALTH MAINTENANCE: You are due for flu, pneumonia, and shingles vaccines. We will schedule a visit with the nurse in one week for your flu and pneumonia vaccines. We will also resubmit a referral to gastroenterology for a colonoscopy and submit a referral for an eye exam.  INSTRUCTIONS: Please check your blood glucose at least twice daily and monitor your blood pressure regularly. Continue with your current medications and follow the dietary recommendations discussed. Schedule a visit with the nurse in one week for your flu and pneumonia vaccines. Follow up with gastroenterology for a colonoscopy and schedule an eye exam.                      Contains text generated by Abridge.                                 Contains text generated by Abridge.

## 2023-11-23 ENCOUNTER — Other Ambulatory Visit: Payer: Self-pay

## 2023-11-24 ENCOUNTER — Other Ambulatory Visit: Payer: Self-pay

## 2023-11-28 ENCOUNTER — Ambulatory Visit

## 2023-12-02 ENCOUNTER — Other Ambulatory Visit: Payer: Self-pay

## 2023-12-06 ENCOUNTER — Other Ambulatory Visit: Payer: Self-pay

## 2023-12-20 ENCOUNTER — Other Ambulatory Visit: Payer: Self-pay

## 2023-12-27 ENCOUNTER — Ambulatory Visit: Admitting: Pharmacist

## 2024-01-09 ENCOUNTER — Encounter (HOSPITAL_COMMUNITY): Payer: Self-pay

## 2024-01-09 ENCOUNTER — Emergency Department (HOSPITAL_COMMUNITY)
Admission: EM | Admit: 2024-01-09 | Discharge: 2024-01-10 | Discharge status: Against Medical Advice | Attending: Emergency Medicine | Admitting: Emergency Medicine

## 2024-01-09 ENCOUNTER — Other Ambulatory Visit: Payer: Self-pay

## 2024-01-09 DIAGNOSIS — H9202 Otalgia, left ear: Secondary | ICD-10-CM | POA: Diagnosis present

## 2024-01-09 DIAGNOSIS — R519 Headache, unspecified: Secondary | ICD-10-CM | POA: Diagnosis not present

## 2024-01-09 DIAGNOSIS — Z5321 Procedure and treatment not carried out due to patient leaving prior to being seen by health care provider: Secondary | ICD-10-CM | POA: Diagnosis not present

## 2024-01-09 NOTE — ED Triage Notes (Signed)
 Quick triage note Pt to ED c/o left ear pain x 1 month, and now experiencing HA that started today.

## 2024-01-10 ENCOUNTER — Encounter: Payer: Self-pay | Admitting: Pharmacist

## 2024-01-10 ENCOUNTER — Ambulatory Visit: Attending: Family Medicine | Admitting: Pharmacist

## 2024-01-10 ENCOUNTER — Other Ambulatory Visit: Payer: Self-pay

## 2024-01-10 DIAGNOSIS — Z794 Long term (current) use of insulin: Secondary | ICD-10-CM

## 2024-01-10 DIAGNOSIS — E1169 Type 2 diabetes mellitus with other specified complication: Secondary | ICD-10-CM

## 2024-01-10 LAB — BASIC METABOLIC PANEL WITH GFR
Anion gap: 9 (ref 5–15)
BUN: 10 mg/dL (ref 6–20)
CO2: 27 mmol/L (ref 22–32)
Calcium: 8.4 mg/dL — ABNORMAL LOW (ref 8.9–10.3)
Chloride: 98 mmol/L (ref 98–111)
Creatinine, Ser: 0.85 mg/dL (ref 0.61–1.24)
GFR, Estimated: >60 mL/min (ref >60)
Glucose, Bld: 327 mg/dL — ABNORMAL HIGH (ref 70–99)
Potassium: 3.4 mmol/L — ABNORMAL LOW (ref 3.5–5.1)
Sodium: 134 mmol/L — ABNORMAL LOW (ref 135–145)

## 2024-01-10 LAB — CBC WITH DIFFERENTIAL/PLATELET
Abs Immature Granulocytes: 0.03 K/uL (ref 0.00–0.07)
Basophils Absolute: 0 K/uL (ref 0.0–0.1)
Basophils Relative: 0 %
Eosinophils Absolute: 0.2 K/uL (ref 0.0–0.5)
Eosinophils Relative: 2 %
HCT: 41.4 % (ref 39.0–52.0)
Hemoglobin: 14.6 g/dL (ref 13.0–17.0)
Immature Granulocytes: 0 %
Lymphocytes Relative: 31 %
Lymphs Abs: 3.3 K/uL (ref 0.7–4.0)
MCH: 30.3 pg (ref 26.0–34.0)
MCHC: 35.3 g/dL (ref 30.0–36.0)
MCV: 85.9 fL (ref 80.0–100.0)
Monocytes Absolute: 0.7 K/uL (ref 0.1–1.0)
Monocytes Relative: 7 %
Neutro Abs: 6.4 K/uL (ref 1.7–7.7)
Neutrophils Relative %: 60 %
Platelets: 283 K/uL (ref 150–400)
RBC: 4.82 MIL/uL (ref 4.22–5.81)
RDW: 11.5 % (ref 11.5–15.5)
WBC: 10.7 K/uL — ABNORMAL HIGH (ref 4.0–10.5)
nRBC: 0 % (ref 0.0–0.2)

## 2024-01-10 MED ORDER — LANTUS SOLOSTAR 100 UNIT/ML ~~LOC~~ SOPN
30.0000 [IU] | PEN_INJECTOR | Freq: Every day | SUBCUTANEOUS | 2 refills | Status: AC
  Filled 2024-01-10 – 2024-02-04 (×2): qty 15, 50d supply, fill #0
  Filled 2024-02-08: qty 27, 90d supply, fill #0

## 2024-01-10 MED ORDER — INSULIN LISPRO (1 UNIT DIAL) 100 UNIT/ML (KWIKPEN)
6.0000 [IU] | PEN_INJECTOR | Freq: Three times a day (TID) | SUBCUTANEOUS | 2 refills | Status: AC
  Filled 2024-01-10: qty 6, 30d supply, fill #0
  Filled 2024-02-04: qty 6, 34d supply, fill #0
  Filled 2024-02-08: qty 15, 84d supply, fill #0

## 2024-01-10 NOTE — ED Notes (Signed)
 Pt informed registration he was leaving.

## 2024-01-10 NOTE — Progress Notes (Signed)
 S:     No chief complaint on file.  53 y.o. male who presents for diabetes evaluation, education, and management. Patient arrives in good spirits and presents without any assistance.   Patient was referred and last seen by Primary Care Provider, Dr. Vicci, on 11/21/23. A1c prior to that visit was 12.9%, taken on 11/09/2023 by our mobile unit. He noted being without medications prior to that appointment due to his insurance elapsing, but our mobile team restarted his Lantus  and Novolog . By the time he was seen by Dr. Rolande on 11/21/23, he endorsed adherence to Lantus  26 units daily and Novolog  5 units TID before meals.   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   Patient reports being dx'd as preDM in 2009. Diabetes was diagnosed in 2011 in the hospital. Was experiencing polyuria and polydipsia at the time that led him to seek hospitalization. Of note, he has been on PO therapy before. Was on Rybelsus but this was discontinued during a hospital admission in 2023. He has been on metformin  prior but this was stopped after he self-discontinued in 04/2022 for the reason of not wanting to take medication. Has a hx of pancreatitis - he was seen in the ED back in 10/2021 for what was felt to be acute, idipathetic pancreatitis. Had elevated lipase with normal imaging. No hx of thyroid cancer.   Family/Social History:  -Fhx: DM -Tobacco: former smoker (quit in 03/2021) -Alcohol: none reported   Current diabetes medications include: Lantus  26 units daily, Humalog  6 units TID before meals Current hypertension medications include: carvedilol  6.25 mg BID Current hyperlipidemia medications include: atorvastatin  40 mg daily   Patient reports adherence to taking all medications as prescribed.   Insurance coverage:  [employer]  Patient denies hypoglycemic events. Reported home fasting blood sugars: 65 - 240s over the last week. Otherwise 80-90s.   Reported 2 hour post-meal/random blood sugars: nothing above 170s per patient.  Patient denies nocturia (nighttime urination).  Patient reports neuropathy (nerve pain). Patient reports visual changes. Patient reports self foot exams.   Patient reported dietary habits:  -Avoids sugary drinks and limiting intake of white carbs.   Patient-reported exercise habits:  -None reported  O:   Lab Results  Component Value Date   HGBA1C 12.9 (A) 11/09/2023   There were no vitals filed for this visit.  Lipid Panel     Component Value Date/Time   CHOL 165 02/16/2023 1425   TRIG 107 02/16/2023 1425   HDL 50 02/16/2023 1425   CHOLHDL 3.3 02/16/2023 1425   CHOLHDL 3.5 08/21/2020 0207   VLDL 11 08/21/2020 0207   LDLCALC 96 02/16/2023 1425    Clinical Atherosclerotic Cardiovascular Disease (ASCVD): Yes  The ASCVD Risk score (Arnett DK, et al., 2019) failed to calculate for the following reasons:   Risk score cannot be calculated because patient has a medical history suggesting prior/existing ASCVD   * - Cholesterol units were assumed   Patient is participating in a Managed Medicaid Plan: No   A/P: Diabetes longstanding currently uncontrolled given his A1c. He is amenable to using the [place]. He has more sensors at home but his first one fell off and he hasn't used since. We will have him monitor with this and return for further adjustment in 1 month. Patient is not symptomatic at this time  but is able to verbalize appropriate hypoglycemia management plan. He is having some neuropathy. Medication adherence appears to be okay.  -Increased dose of Basaglar  to 30 units daily. Change to qAM dosing.  -Continued Humalog  (insulin  lispro) 6 units TID but change to BEFORE MEALS. Pt counseled to take ~15 minutes before eating. -Patient educated on purpose, proper  use, and potential adverse effects of insulin .  -Extensively discussed pathophysiology of diabetes, recommended lifestyle interventions, dietary effects on blood sugar control.  -Counseled on s/sx of and management of hypoglycemia.  -Next A1c anticipated 01/2024.   Written patient instructions provided. Patient verbalized understanding of treatment plan.  Total time in face to face counseling 30 minutes.    Follow-up:  Pharmacist in 1 month.  Herlene Fleeta Morris, PharmD, JAQUELINE, CPP Clinical Pharmacist [facility] [phone]

## 2024-02-06 ENCOUNTER — Other Ambulatory Visit: Payer: Self-pay

## 2024-02-07 ENCOUNTER — Ambulatory Visit: Payer: Self-pay | Admitting: Pharmacist

## 2024-02-09 ENCOUNTER — Other Ambulatory Visit: Payer: Self-pay

## 2024-02-27 ENCOUNTER — Ambulatory Visit: Payer: Self-pay

## 2024-02-27 ENCOUNTER — Telehealth: Payer: Self-pay | Admitting: Internal Medicine

## 2024-02-27 ENCOUNTER — Ambulatory Visit: Admitting: Pharmacist

## 2024-03-02 ENCOUNTER — Ambulatory Visit: Payer: Self-pay | Admitting: Pharmacist

## 2024-03-05 ENCOUNTER — Ambulatory Visit: Payer: Self-pay | Admitting: Nurse Practitioner

## 2024-03-23 ENCOUNTER — Ambulatory Visit: Admitting: Internal Medicine
